# Patient Record
Sex: Male | Born: 1970
Health system: Southern US, Community
[De-identification: ages and names within clinical notes are randomized; demographics above are authoritative.]

## PROBLEM LIST (undated history)

## (undated) DIAGNOSIS — I4729 Other ventricular tachycardia: Secondary | ICD-10-CM

## (undated) DIAGNOSIS — Z9889 Other specified postprocedural states: Secondary | ICD-10-CM

## (undated) DIAGNOSIS — N138 Other obstructive and reflux uropathy: Secondary | ICD-10-CM

## (undated) DIAGNOSIS — E1140 Type 2 diabetes mellitus with diabetic neuropathy, unspecified: Secondary | ICD-10-CM

## (undated) DIAGNOSIS — I2119 ST elevation (STEMI) myocardial infarction involving other coronary artery of inferior wall: Secondary | ICD-10-CM

## (undated) DIAGNOSIS — I480 Paroxysmal atrial fibrillation: Secondary | ICD-10-CM

## (undated) DIAGNOSIS — I2510 Atherosclerotic heart disease of native coronary artery without angina pectoris: Secondary | ICD-10-CM

## (undated) DIAGNOSIS — I471 Supraventricular tachycardia, unspecified: Secondary | ICD-10-CM

## (undated) DIAGNOSIS — F0390 Unspecified dementia without behavioral disturbance: Secondary | ICD-10-CM

## (undated) DIAGNOSIS — H269 Unspecified cataract: Secondary | ICD-10-CM

## (undated) DIAGNOSIS — Z933 Colostomy status: Secondary | ICD-10-CM

## (undated) DIAGNOSIS — J95851 Ventilator associated pneumonia: Secondary | ICD-10-CM

## (undated) DIAGNOSIS — F32A Depression, unspecified: Secondary | ICD-10-CM

## (undated) DIAGNOSIS — I10 Essential (primary) hypertension: Secondary | ICD-10-CM

## (undated) DIAGNOSIS — Z9289 Personal history of other medical treatment: Secondary | ICD-10-CM

## (undated) DIAGNOSIS — I69320 Aphasia following cerebral infarction: Secondary | ICD-10-CM

## (undated) DIAGNOSIS — N319 Neuromuscular dysfunction of bladder, unspecified: Secondary | ICD-10-CM

## (undated) DIAGNOSIS — Z8619 Personal history of other infectious and parasitic diseases: Secondary | ICD-10-CM

## (undated) DIAGNOSIS — G2581 Restless legs syndrome: Secondary | ICD-10-CM

## (undated) DIAGNOSIS — J9690 Respiratory failure, unspecified, unspecified whether with hypoxia or hypercapnia: Secondary | ICD-10-CM

## (undated) DIAGNOSIS — M869 Osteomyelitis, unspecified: Secondary | ICD-10-CM

## (undated) DIAGNOSIS — D649 Anemia, unspecified: Secondary | ICD-10-CM

## (undated) DIAGNOSIS — E119 Type 2 diabetes mellitus without complications: Secondary | ICD-10-CM

## (undated) DIAGNOSIS — I639 Cerebral infarction, unspecified: Secondary | ICD-10-CM

## (undated) DIAGNOSIS — I472 Ventricular tachycardia: Secondary | ICD-10-CM

## (undated) DIAGNOSIS — I739 Peripheral vascular disease, unspecified: Secondary | ICD-10-CM

## (undated) DIAGNOSIS — N401 Enlarged prostate with lower urinary tract symptoms: Secondary | ICD-10-CM

## (undated) DIAGNOSIS — N189 Chronic kidney disease, unspecified: Secondary | ICD-10-CM

## (undated) DIAGNOSIS — G931 Anoxic brain damage, not elsewhere classified: Secondary | ICD-10-CM

## (undated) DIAGNOSIS — G8190 Hemiplegia, unspecified affecting unspecified side: Secondary | ICD-10-CM

## (undated) DIAGNOSIS — I469 Cardiac arrest, cause unspecified: Secondary | ICD-10-CM

## (undated) DIAGNOSIS — Z9359 Other cystostomy status: Secondary | ICD-10-CM

## (undated) DIAGNOSIS — R41841 Cognitive communication deficit: Secondary | ICD-10-CM

## (undated) DIAGNOSIS — K219 Gastro-esophageal reflux disease without esophagitis: Secondary | ICD-10-CM

## (undated) DIAGNOSIS — L219 Seborrheic dermatitis, unspecified: Secondary | ICD-10-CM

## (undated) HISTORY — DX: Respiratory failure, unspecified, unspecified whether with hypoxia or hypercapnia: J96.90

## (undated) HISTORY — DX: Ventilator associated pneumonia: J95.851

## (undated) HISTORY — DX: Anoxic brain damage, not elsewhere classified: G93.1

## (undated) HISTORY — DX: Other ventricular tachycardia: I47.29

## (undated) HISTORY — DX: Atherosclerotic heart disease of native coronary artery without angina pectoris: I25.10

## (undated) HISTORY — DX: ST elevation (STEMI) myocardial infarction involving other coronary artery of inferior wall: I21.19

## (undated) HISTORY — DX: Cardiac arrest, cause unspecified: I46.9

## (undated) HISTORY — DX: Type 2 diabetes mellitus without complications: E11.9

## (undated) HISTORY — DX: Ventricular tachycardia: I47.2

## (undated) HISTORY — DX: Essential (primary) hypertension: I10

---

## 1998-01-20 ENCOUNTER — Ambulatory Visit: Admission: RE | Admit: 1998-01-20 | Discharge: 1998-01-20 | Payer: Self-pay | Admitting: Unknown Physician Specialty

## 2010-04-19 ENCOUNTER — Encounter: Payer: Self-pay | Admitting: Internal Medicine

## 2010-04-25 ENCOUNTER — Encounter: Payer: Self-pay | Admitting: Internal Medicine

## 2010-05-01 ENCOUNTER — Encounter: Payer: Self-pay | Admitting: Internal Medicine

## 2010-05-17 ENCOUNTER — Encounter: Payer: Self-pay | Admitting: Internal Medicine

## 2010-05-24 ENCOUNTER — Institutional Professional Consult (permissible substitution) (INDEPENDENT_AMBULATORY_CARE_PROVIDER_SITE_OTHER): Payer: 59 | Admitting: Internal Medicine

## 2010-05-24 ENCOUNTER — Encounter: Payer: Self-pay | Admitting: Internal Medicine

## 2010-05-24 DIAGNOSIS — I472 Ventricular tachycardia: Secondary | ICD-10-CM

## 2010-05-25 DIAGNOSIS — I472 Ventricular tachycardia, unspecified: Secondary | ICD-10-CM | POA: Insufficient documentation

## 2010-05-25 DIAGNOSIS — I10 Essential (primary) hypertension: Secondary | ICD-10-CM | POA: Insufficient documentation

## 2010-05-31 NOTE — Assessment & Plan Note (Signed)
Summary: nep/abn ekg/dyspnea/ischemia/ [facility]/ref d...   Visit Type:  Initial Consult Referring Provider:  r. Nadyne Coombes Primary Provider:  [facility]  CC:  no symptoms.  History of Present Illness: Mr. Peak is referred today by Dr. Marko Plume for evaluation of NSVT during exercise.  The patient is a very pleasant 40 yo man with a h/o HTN and DM. He was found on routing screening to have an abnormal ECG with PVC's and underwent exercise testing where he had an exercise treadmill which demonstrated no ischemia and preserved LV function but exercise induced NSVT. He has never had syncope. He denies c/p, sob or peripheral edema. He does not have trouble working.  Review of his ECG demonstrates PVC's and NSVT originating from the RVOT.   Current Medications (verified): 1)  Metformin Hcl 1000 Mg Tabs (Metformin Hcl) .... Two Times A Day 2)  Glipizide 10 Mg Tabs (Glipizide) .... Once Daily 3)  Lisinopril 20 Mg Tabs (Lisinopril) .... Take One Tablet By Mouth Daily 4)  Lisinopril-Hydrochlorothiazide 20-12.5 Mg Tabs (Lisinopril-Hydrochlorothiazide) .... Every Morning 5)  Aspirin 81 Mg Tbec (Aspirin) .... Take One Tablet By Mouth Daily 6)  Allegra-D Allergy & Congestion 60-120 Mg Xr12h-Tab (Fexofenadine-Pseudoephedrine) .... Once Daily 7)  Fish Oil 1000 Mg Caps (Omega-3 Fatty Acids) .... Two Times A Day  Allergies (verified): No Known Drug Allergies  Past History:  Past Medical History: Last updated: 05/24/2010 Diabetes Type 2 Hypertension Tachycardia  Family History: Last updated: 05/24/2010 Mother: heart attack, hypertension and diabestes Family History of Coronary Artery Disease:  Family History of Diabetes:   Social History: Last updated: 05/24/2010 Single  Tobacco Use - Former.  Alcohol Use - no Regular Exercise - no Drug Use - no  Review of Systems       All systems reviewed and negative except as noted in the HPI.  Vital Signs:  Patient  profile:   40 year old male Height:      76 inches Weight:      273 pounds BMI:     33.35 Pulse rate:   95 / minute BP sitting:   142 / 74  (left arm) Cuff size:   large  Vitals Entered By: Hardin Negus, RMA (May 24, 2010 9:28 AM)  Physical Exam  General:  Well developed, well nourished, in no acute distress. Head:  normocephalic and atraumatic Eyes:  PERRLA/EOM intact; conjunctiva and lids normal. Mouth:  Teeth, gums and palate normal. Oral mucosa normal. Neck:  Neck supple, no JVD. No masses, thyromegaly or abnormal cervical nodes. Lungs:  Clear bilaterally to auscultation with no wheezes, rales, or rhonchi. Heart:  RRR with normal S1 and S2. PMI is not enlarged. Pulses:  pulses normal in all 4 extremities Extremities:  No clubbing or cyanosis. Neurologic:  Alert and oriented x 3.   EKG  Procedure date:  05/24/2010  Findings:      Normal sinus rhythm with rate of:  64. PVC's noted.    Impression & Recommendations:  Problem # 1:  VENTRICULAR TACHYCARDIA, PAROXYSMAL (ICD-427.1) His PVC's and exercise induced NSVT appear to be originating from the RVOT. I have recommended that the patient undergo a period of watchful waiting. He is asymptomatic at the present time. There is no evidence that he has any potentially more malignant cause of his ectopy. His updated medication list for this problem includes:    Lisinopril 20 Mg Tabs (Lisinopril) .Marland Kitchen... Take one tablet by mouth daily    Lisinopril-hydrochlorothiazide  20-12.5 Mg Tabs (Lisinopril-hydrochlorothiazide) ..... Every morning    Aspirin 81 Mg Tbec (Aspirin) .Marland Kitchen... Take one tablet by mouth daily  Problem # 2:  ESSENTIAL HYPERTENSION, BENIGN (ICD-401.1) His blood pressure is fairly well controlled. Will follow if needed. His updated medication list for this problem includes:    Lisinopril 20 Mg Tabs (Lisinopril) .Marland Kitchen... Take one tablet by mouth daily    Lisinopril-hydrochlorothiazide 20-12.5 Mg Tabs  (Lisinopril-hydrochlorothiazide) ..... Every morning    Aspirin 81 Mg Tbec (Aspirin) .Marland Kitchen... Take one tablet by mouth daily  Patient Instructions: 1)  Your physician recommends that you schedule a follow-up appointment as needed

## 2010-06-20 NOTE — Letter (Signed)
Summary: [facility]  [facility]   Imported By: Marylou Mccoy 06/13/2010 16:26:13  _____________________________________________________________________  External Attachment:    Type:   Image     Comment:   External Document

## 2010-08-11 ENCOUNTER — Telehealth: Payer: Self-pay | Admitting: Internal Medicine

## 2010-08-11 NOTE — Telephone Encounter (Signed)
LOV faxed to Cynthia/[facility] @ [phone] 08/11/10/km

## 2011-05-02 ENCOUNTER — Encounter: Payer: Self-pay | Admitting: Family Medicine

## 2011-07-18 ENCOUNTER — Ambulatory Visit: Payer: Self-pay | Admitting: Physician Assistant

## 2012-02-26 ENCOUNTER — Other Ambulatory Visit: Payer: Self-pay | Admitting: *Deleted

## 2012-02-26 MED ORDER — GLIPIZIDE 10 MG PO TABS: 10.0000 mg | ORAL_TABLET | Freq: Every day | ORAL | Status: DC

## 2012-02-26 MED ORDER — METFORMIN HCL 1000 MG PO TABS: 1000.0000 mg | ORAL_TABLET | Freq: Two times a day (BID) | ORAL | Status: DC

## 2012-03-05 ENCOUNTER — Ambulatory Visit (INDEPENDENT_AMBULATORY_CARE_PROVIDER_SITE_OTHER): Payer: 59 | Admitting: Physician Assistant

## 2012-03-05 ENCOUNTER — Encounter: Payer: Self-pay | Admitting: Physician Assistant

## 2012-03-05 VITALS — BP 164/86 | HR 88 | Temp 98.2°F | Resp 16 | Ht 74.0 in | Wt 287.4 lb

## 2012-03-05 DIAGNOSIS — I10 Essential (primary) hypertension: Secondary | ICD-10-CM

## 2012-03-05 DIAGNOSIS — E119 Type 2 diabetes mellitus without complications: Secondary | ICD-10-CM

## 2012-03-05 DIAGNOSIS — E785 Hyperlipidemia, unspecified: Secondary | ICD-10-CM

## 2012-03-05 LAB — CBC WITH DIFFERENTIAL/PLATELET
Basophils Absolute: 0 10*3/uL (ref 0.0–0.1)
HCT: 38.3 % — ABNORMAL LOW (ref 39.0–52.0)
Lymphocytes Relative: 38 % (ref 12–46)
Lymphs Abs: 2.2 10*3/uL (ref 0.7–4.0)
MCV: 86.1 fL (ref 78.0–100.0)
Neutro Abs: 2.9 10*3/uL (ref 1.7–7.7)
Platelets: 221 10*3/uL (ref 150–400)
RBC: 4.45 MIL/uL (ref 4.22–5.81)
RDW: 14.2 % (ref 11.5–15.5)
WBC: 5.8 10*3/uL (ref 4.0–10.5)

## 2012-03-05 LAB — GLUCOSE, POCT (MANUAL RESULT ENTRY): POC Glucose: 156 mg/dl — AB (ref 70–99)

## 2012-03-05 LAB — TSH: TSH: 2.282 u[IU]/mL (ref 0.350–4.500)

## 2012-03-05 LAB — COMPREHENSIVE METABOLIC PANEL
ALT: 25 U/L (ref 0–53)
AST: 19 U/L (ref 0–37)
Albumin: 4.2 g/dL (ref 3.5–5.2)
CO2: 25 mEq/L (ref 19–32)
Calcium: 9.3 mg/dL (ref 8.4–10.5)
Chloride: 101 mEq/L (ref 96–112)
Potassium: 4 mEq/L (ref 3.5–5.3)
Total Protein: 6.9 g/dL (ref 6.0–8.3)

## 2012-03-05 LAB — POCT GLYCOSYLATED HEMOGLOBIN (HGB A1C): Hemoglobin A1C: 6

## 2012-03-05 LAB — LIPID PANEL: LDL Cholesterol: 91 mg/dL (ref 0–99)

## 2012-03-05 MED ORDER — METFORMIN HCL 1000 MG PO TABS: 1000.0000 mg | ORAL_TABLET | Freq: Two times a day (BID) | ORAL | Status: DC

## 2012-03-05 MED ORDER — LISINOPRIL 10 MG PO TABS: 10.0000 mg | ORAL_TABLET | Freq: Every day | ORAL | Status: DC

## 2012-03-05 MED ORDER — LISINOPRIL-HYDROCHLOROTHIAZIDE 10-12.5 MG PO TABS: 1.0000 | ORAL_TABLET | Freq: Every day | ORAL | Status: DC

## 2012-03-05 MED ORDER — GLIPIZIDE 10 MG PO TABS: 10.0000 mg | ORAL_TABLET | Freq: Every day | ORAL | Status: DC

## 2012-03-05 NOTE — Progress Notes (Signed)
  Subjective:    Patient ID: Albert Jensen, male    DOB: 1970-08-31, 41 y.o.   MRN: [id number]  HPI 41 yr old CM presents for diabetes and hypertension check-up.  He is non-compliant checking sugars, but he does check them "occasionally" and would say his avg is around 160s.  He is compliant with his meds, but he has been off all of them for the past 2 weeks bc he ran out.  He denies any complaints or problems.  Review of Systems  All other systems reviewed and are negative.       Objective:   Physical Exam  Nursing note and vitals reviewed. Constitutional: He is oriented to person, place, and time. He appears well-developed and well-nourished.  HENT:  Head: Normocephalic and atraumatic.  Cardiovascular: Normal rate, regular rhythm and normal heart sounds.   Pulmonary/Chest: Effort normal and breath sounds normal.  Neurological: He is alert and oriented to person, place, and time.  Skin: Skin is warm and dry.  Psychiatric: He has a normal mood and affect. His behavior is normal. Judgment and thought content normal.          Assessment & Plan:  Diabetes-likely uncontrolled.  Will await labs. Encouraged healthier diet, exercise.  Hypertension-BP is up today, but he is out of meds.  It is usu controlled.  He will restart his meds today. Hyperlipidemia-likely needs a statin.

## 2012-03-06 LAB — MICROALBUMIN, URINE: Microalb, Ur: 1.4 mg/dL (ref 0.00–1.89)

## 2012-03-06 MED ORDER — FENOFIBRATE 48 MG PO TABS: 96.0000 mg | ORAL_TABLET | Freq: Every day | ORAL | Status: DC

## 2012-03-06 NOTE — Addendum Note (Signed)
Addended by: Anders Simmonds on: 03/06/2012 01:55 PM   Modules accepted: Orders

## 2012-03-10 ENCOUNTER — Telehealth: Payer: Self-pay

## 2012-03-10 NOTE — Telephone Encounter (Signed)
Pt requesting lab results.

## 2012-03-10 NOTE — Telephone Encounter (Signed)
Spoke with pt and went over results pt did understand and will follow up in 3 months.

## 2012-05-12 ENCOUNTER — Telehealth: Payer: Self-pay

## 2012-05-12 MED ORDER — FENOFIBRATE MICRONIZED 130 MG PO CAPS: 130.0000 mg | ORAL_CAPSULE | Freq: Every day | ORAL | Status: DC

## 2012-05-12 NOTE — Telephone Encounter (Signed)
Tried to do prior auth for pt's Rx for Fenofibrate 48 mg, 2 tab QD. I was told this is a non-covered med in this strength. It is a Tier 1 covered med w/out PA in strengths 43 mg, 67 mg, 130 mg, 134 mg ALL IN THE CONVENTIONAL CAPSULE FORM. Can we change pts Rx to one of these?

## 2012-05-12 NOTE — Telephone Encounter (Signed)
Rx sent to pharmacy   

## 2012-05-13 NOTE — Telephone Encounter (Signed)
Called patient to advise his meds were changed.

## 2015-01-22 ENCOUNTER — Emergency Department (HOSPITAL_COMMUNITY): Payer: Managed Care, Other (non HMO)

## 2015-01-22 ENCOUNTER — Ambulatory Visit (HOSPITAL_COMMUNITY): Admit: 2015-01-22 | Payer: Self-pay | Admitting: Interventional Cardiology

## 2015-01-22 ENCOUNTER — Encounter (HOSPITAL_COMMUNITY)
Admission: EM | Disposition: A | Discharge status: Nursing Home (Includes ICF) | Payer: Self-pay | Source: Home / Self Care | Attending: Pulmonary Disease

## 2015-01-22 ENCOUNTER — Inpatient Hospital Stay (HOSPITAL_COMMUNITY)
Admission: EM | Admit: 2015-01-22 | Discharge: 2015-02-08 | DRG: 003 | Disposition: A | Discharge status: Other Acute Inpatient Hospital | Payer: Managed Care, Other (non HMO) | Attending: Pulmonary Disease | Admitting: Pulmonary Disease

## 2015-01-22 DIAGNOSIS — Y9389 Activity, other specified: Secondary | ICD-10-CM

## 2015-01-22 DIAGNOSIS — E1165 Type 2 diabetes mellitus with hyperglycemia: Secondary | ICD-10-CM | POA: Diagnosis not present

## 2015-01-22 DIAGNOSIS — E669 Obesity, unspecified: Secondary | ICD-10-CM | POA: Diagnosis not present

## 2015-01-22 DIAGNOSIS — Z7984 Long term (current) use of oral hypoglycemic drugs: Secondary | ICD-10-CM | POA: Diagnosis not present

## 2015-01-22 DIAGNOSIS — L89150 Pressure ulcer of sacral region, unstageable: Secondary | ICD-10-CM | POA: Diagnosis not present

## 2015-01-22 DIAGNOSIS — E872 Acidosis: Secondary | ICD-10-CM | POA: Diagnosis not present

## 2015-01-22 DIAGNOSIS — Z930 Tracheostomy status: Secondary | ICD-10-CM | POA: Insufficient documentation

## 2015-01-22 DIAGNOSIS — L409 Psoriasis, unspecified: Secondary | ICD-10-CM | POA: Diagnosis present

## 2015-01-22 DIAGNOSIS — E785 Hyperlipidemia, unspecified: Secondary | ICD-10-CM | POA: Diagnosis present

## 2015-01-22 DIAGNOSIS — R9431 Abnormal electrocardiogram [ECG] [EKG]: Secondary | ICD-10-CM

## 2015-01-22 DIAGNOSIS — I10 Essential (primary) hypertension: Secondary | ICD-10-CM | POA: Diagnosis present

## 2015-01-22 DIAGNOSIS — I469 Cardiac arrest, cause unspecified: Secondary | ICD-10-CM | POA: Diagnosis present

## 2015-01-22 DIAGNOSIS — G9340 Encephalopathy, unspecified: Secondary | ICD-10-CM | POA: Diagnosis not present

## 2015-01-22 DIAGNOSIS — Z833 Family history of diabetes mellitus: Secondary | ICD-10-CM | POA: Diagnosis not present

## 2015-01-22 DIAGNOSIS — E870 Hyperosmolality and hypernatremia: Secondary | ICD-10-CM | POA: Diagnosis not present

## 2015-01-22 DIAGNOSIS — W1839XA Other fall on same level, initial encounter: Secondary | ICD-10-CM | POA: Diagnosis present

## 2015-01-22 DIAGNOSIS — R0603 Acute respiratory distress: Secondary | ICD-10-CM

## 2015-01-22 DIAGNOSIS — J9601 Acute respiratory failure with hypoxia: Secondary | ICD-10-CM | POA: Diagnosis not present

## 2015-01-22 DIAGNOSIS — I2129 ST elevation (STEMI) myocardial infarction involving other sites: Secondary | ICD-10-CM | POA: Diagnosis not present

## 2015-01-22 DIAGNOSIS — I462 Cardiac arrest due to underlying cardiac condition: Secondary | ICD-10-CM | POA: Diagnosis present

## 2015-01-22 DIAGNOSIS — G931 Anoxic brain damage, not elsewhere classified: Secondary | ICD-10-CM | POA: Diagnosis not present

## 2015-01-22 DIAGNOSIS — I213 ST elevation (STEMI) myocardial infarction of unspecified site: Secondary | ICD-10-CM | POA: Diagnosis present

## 2015-01-22 DIAGNOSIS — T17590A Other foreign object in bronchus causing asphyxiation, initial encounter: Secondary | ICD-10-CM | POA: Diagnosis not present

## 2015-01-22 DIAGNOSIS — J9600 Acute respiratory failure, unspecified whether with hypoxia or hypercapnia: Secondary | ICD-10-CM

## 2015-01-22 DIAGNOSIS — Z720 Tobacco use: Secondary | ICD-10-CM

## 2015-01-22 DIAGNOSIS — Z6829 Body mass index (BMI) 29.0-29.9, adult: Secondary | ICD-10-CM

## 2015-01-22 DIAGNOSIS — Z8249 Family history of ischemic heart disease and other diseases of the circulatory system: Secondary | ICD-10-CM

## 2015-01-22 DIAGNOSIS — Y9289 Other specified places as the place of occurrence of the external cause: Secondary | ICD-10-CM

## 2015-01-22 DIAGNOSIS — N170 Acute kidney failure with tubular necrosis: Secondary | ICD-10-CM | POA: Diagnosis not present

## 2015-01-22 DIAGNOSIS — J690 Pneumonitis due to inhalation of food and vomit: Secondary | ICD-10-CM | POA: Diagnosis not present

## 2015-01-22 DIAGNOSIS — I2102 ST elevation (STEMI) myocardial infarction involving left anterior descending coronary artery: Secondary | ICD-10-CM | POA: Diagnosis not present

## 2015-01-22 DIAGNOSIS — I959 Hypotension, unspecified: Secondary | ICD-10-CM | POA: Diagnosis not present

## 2015-01-22 DIAGNOSIS — R34 Anuria and oliguria: Secondary | ICD-10-CM | POA: Diagnosis not present

## 2015-01-22 DIAGNOSIS — Y95 Nosocomial condition: Secondary | ICD-10-CM | POA: Diagnosis not present

## 2015-01-22 DIAGNOSIS — N179 Acute kidney failure, unspecified: Secondary | ICD-10-CM | POA: Diagnosis not present

## 2015-01-22 DIAGNOSIS — J9811 Atelectasis: Secondary | ICD-10-CM | POA: Insufficient documentation

## 2015-01-22 DIAGNOSIS — B379 Candidiasis, unspecified: Secondary | ICD-10-CM | POA: Diagnosis not present

## 2015-01-22 DIAGNOSIS — J189 Pneumonia, unspecified organism: Secondary | ICD-10-CM | POA: Diagnosis not present

## 2015-01-22 DIAGNOSIS — Z978 Presence of other specified devices: Secondary | ICD-10-CM

## 2015-01-22 DIAGNOSIS — S225XXA Flail chest, initial encounter for closed fracture: Secondary | ICD-10-CM | POA: Diagnosis present

## 2015-01-22 DIAGNOSIS — R410 Disorientation, unspecified: Secondary | ICD-10-CM | POA: Diagnosis present

## 2015-01-22 DIAGNOSIS — Z4659 Encounter for fitting and adjustment of other gastrointestinal appliance and device: Secondary | ICD-10-CM

## 2015-01-22 DIAGNOSIS — R1310 Dysphagia, unspecified: Secondary | ICD-10-CM | POA: Diagnosis not present

## 2015-01-22 DIAGNOSIS — Z9289 Personal history of other medical treatment: Secondary | ICD-10-CM

## 2015-01-22 DIAGNOSIS — J9690 Respiratory failure, unspecified, unspecified whether with hypoxia or hypercapnia: Secondary | ICD-10-CM

## 2015-01-22 DIAGNOSIS — IMO0002 Reserved for concepts with insufficient information to code with codable children: Secondary | ICD-10-CM | POA: Diagnosis present

## 2015-01-22 DIAGNOSIS — I2510 Atherosclerotic heart disease of native coronary artery without angina pectoris: Secondary | ICD-10-CM | POA: Diagnosis present

## 2015-01-22 DIAGNOSIS — Z452 Encounter for adjustment and management of vascular access device: Secondary | ICD-10-CM

## 2015-01-22 DIAGNOSIS — I2119 ST elevation (STEMI) myocardial infarction involving other coronary artery of inferior wall: Principal | ICD-10-CM | POA: Diagnosis present

## 2015-01-22 DIAGNOSIS — J9819 Other pulmonary collapse: Secondary | ICD-10-CM

## 2015-01-22 DIAGNOSIS — E876 Hypokalemia: Secondary | ICD-10-CM | POA: Diagnosis not present

## 2015-01-22 DIAGNOSIS — I2111 ST elevation (STEMI) myocardial infarction involving right coronary artery: Secondary | ICD-10-CM | POA: Diagnosis not present

## 2015-01-22 DIAGNOSIS — D62 Acute posthemorrhagic anemia: Secondary | ICD-10-CM | POA: Diagnosis not present

## 2015-01-22 DIAGNOSIS — I639 Cerebral infarction, unspecified: Secondary | ICD-10-CM

## 2015-01-22 DIAGNOSIS — Z4689 Encounter for fitting and adjustment of other specified devices: Secondary | ICD-10-CM

## 2015-01-22 DIAGNOSIS — N390 Urinary tract infection, site not specified: Secondary | ICD-10-CM | POA: Diagnosis present

## 2015-01-22 DIAGNOSIS — L8990 Pressure ulcer of unspecified site, unspecified stage: Secondary | ICD-10-CM | POA: Diagnosis present

## 2015-01-22 HISTORY — PX: CARDIAC CATHETERIZATION: SHX172

## 2015-01-22 LAB — GLUCOSE, CAPILLARY
GLUCOSE-CAPILLARY: 385 mg/dL — AB (ref 65–99)
GLUCOSE-CAPILLARY: 318 mg/dL — AB (ref 65–99)
GLUCOSE-CAPILLARY: 234 mg/dL — AB (ref 65–99)
Glucose-Capillary: 474 mg/dL — ABNORMAL HIGH (ref 65–99)
Glucose-Capillary: 416 mg/dL — ABNORMAL HIGH (ref 65–99)
Glucose-Capillary: 268 mg/dL — ABNORMAL HIGH (ref 65–99)
Glucose-Capillary: 232 mg/dL — ABNORMAL HIGH (ref 65–99)

## 2015-01-22 LAB — BLOOD GAS, ARTERIAL
Acid-base deficit: 8.9 mmol/L — ABNORMAL HIGH (ref 0.0–2.0)
BICARBONATE: 16.7 meq/L — AB (ref 20.0–24.0)
Drawn by: 28338
FIO2: 0.5
O2 Saturation: 99.6 %
PATIENT TEMPERATURE: 98.6
PEEP: 5 cmH2O
RATE: 18 resp/min
TCO2: 17.9 mmol/L (ref 0–100)
VT: 570 mL
pCO2 arterial: 37.9 mmHg (ref 35.0–45.0)
pH, Arterial: 7.268 — ABNORMAL LOW (ref 7.350–7.450)
pO2, Arterial: 254 mmHg — ABNORMAL HIGH (ref 80.0–100.0)

## 2015-01-22 LAB — PROTIME-INR
INR: 1.09 (ref 0.00–1.49)
INR: 3.06 — ABNORMAL HIGH (ref 0.00–1.49)
INR: 1.08 (ref 0.00–1.49)
Prothrombin Time: 14.3 seconds (ref 11.6–15.2)
Prothrombin Time: 31.1 seconds — ABNORMAL HIGH (ref 11.6–15.2)
Prothrombin Time: 14.2 seconds (ref 11.6–15.2)

## 2015-01-22 LAB — CBC WITH DIFFERENTIAL/PLATELET
BASOS PCT: 0 %
Basophils Absolute: 0 10*3/uL (ref 0.0–0.1)
EOS ABS: 0.1 10*3/uL (ref 0.0–0.7)
EOS PCT: 1 %
HCT: 44 % (ref 39.0–52.0)
HEMOGLOBIN: 15.1 g/dL (ref 13.0–17.0)
Lymphocytes Relative: 37 %
Lymphs Abs: 4.7 10*3/uL — ABNORMAL HIGH (ref 0.7–4.0)
MCH: 29.7 pg (ref 26.0–34.0)
MCHC: 34.3 g/dL (ref 30.0–36.0)
MCV: 86.4 fL (ref 78.0–100.0)
MONO ABS: 0.5 10*3/uL (ref 0.1–1.0)
MONOS PCT: 4 %
NEUTROS PCT: 58 %
Neutro Abs: 7.3 10*3/uL (ref 1.7–7.7)
PLATELETS: 264 10*3/uL (ref 150–400)
RBC: 5.09 MIL/uL (ref 4.22–5.81)
RDW: 13.2 % (ref 11.5–15.5)
WBC: 12.6 10*3/uL — ABNORMAL HIGH (ref 4.0–10.5)

## 2015-01-22 LAB — I-STAT TROPONIN, ED: TROPONIN I, POC: 0.1 ng/mL — AB (ref 0.00–0.08)

## 2015-01-22 LAB — BASIC METABOLIC PANEL
Anion gap: 13 (ref 5–15)
BUN: 16 mg/dL (ref 6–20)
CALCIUM: 8.1 mg/dL — AB (ref 8.9–10.3)
CHLORIDE: 103 mmol/L (ref 101–111)
CO2: 19 mmol/L — AB (ref 22–32)
CREATININE: 1.11 mg/dL (ref 0.61–1.24)
GFR calc Af Amer: >60 mL/min (ref >60)
GFR calc non Af Amer: >60 mL/min (ref >60)
Glucose, Bld: 511 mg/dL — ABNORMAL HIGH (ref 65–99)
Potassium: 4.2 mmol/L (ref 3.5–5.1)
Sodium: 135 mmol/L (ref 135–145)

## 2015-01-22 LAB — APTT: aPTT: 87 seconds — ABNORMAL HIGH (ref 24–37)

## 2015-01-22 LAB — TROPONIN I
TROPONIN I: 2.66 ng/mL — AB (ref <0.031)
Troponin I: 7.54 ng/mL (ref <0.031)

## 2015-01-22 LAB — MRSA PCR SCREENING: MRSA BY PCR: NEGATIVE

## 2015-01-22 SURGERY — LEFT HEART CATH AND CORONARY ANGIOGRAPHY
Anesthesia: LOCAL

## 2015-01-22 MED ORDER — CANGRELOR TETRASODIUM 50 MG IV SOLR
INTRAVENOUS | Status: AC
  Filled 2015-01-22: qty 50

## 2015-01-22 MED ORDER — ACETAMINOPHEN 325 MG PO TABS
650.0000 mg | ORAL_TABLET | ORAL | Status: DC | PRN
  Administered 2015-01-23 – 2015-01-31 (×6): 650 mg via ORAL
  Filled 2015-01-22 (×6): qty 2

## 2015-01-22 MED ORDER — ROCURONIUM BROMIDE 50 MG/5ML IV SOLN
INTRAVENOUS | Status: AC
Start: 2015-01-22 — End: 2015-01-23
  Filled 2015-01-22: qty 2

## 2015-01-22 MED ORDER — ASPIRIN 81 MG PO CHEW
81.0000 mg | CHEWABLE_TABLET | Freq: Every day | ORAL | Status: DC
  Administered 2015-01-22 – 2015-02-08 (×18): 81 mg
  Filled 2015-01-22 (×19): qty 1

## 2015-01-22 MED ORDER — ONDANSETRON HCL 4 MG/2ML IJ SOLN: 4.0000 mg | Freq: Four times a day (QID) | INTRAMUSCULAR | Status: DC | PRN

## 2015-01-22 MED ORDER — SODIUM CHLORIDE 0.9 % IV SOLN
250.0000 mg | INTRAVENOUS | Status: DC | PRN
  Administered 2015-01-22: 1.75 mg/kg/h via INTRAVENOUS
  Administered 2015-01-22: 1.75 mg/kg/h

## 2015-01-22 MED ORDER — LIDOCAINE HCL (CARDIAC) 20 MG/ML IV SOLN
INTRAVENOUS | Status: AC
  Filled 2015-01-22: qty 5

## 2015-01-22 MED ORDER — ETOMIDATE 2 MG/ML IV SOLN
INTRAVENOUS | Status: AC
  Filled 2015-01-22: qty 20

## 2015-01-22 MED ORDER — MIDAZOLAM HCL 2 MG/2ML IJ SOLN
INTRAMUSCULAR | Status: DC | PRN
  Administered 2015-01-22 (×5): 2 mg via INTRAVENOUS

## 2015-01-22 MED ORDER — INSULIN ASPART 100 UNIT/ML ~~LOC~~ SOLN: 2.0000 [IU] | SUBCUTANEOUS | Status: DC

## 2015-01-22 MED ORDER — SUCCINYLCHOLINE CHLORIDE 20 MG/ML IJ SOLN
INTRAMUSCULAR | Status: DC | PRN
  Administered 2015-01-22: 200 mg via INTRAVENOUS

## 2015-01-22 MED ORDER — HEPARIN SODIUM (PORCINE) 1000 UNIT/ML IJ SOLN
INTRAMUSCULAR | Status: AC
  Filled 2015-01-22: qty 1

## 2015-01-22 MED ORDER — SODIUM CHLORIDE 0.9 % IV SOLN
25.0000 ug/h | INTRAVENOUS | Status: DC
  Administered 2015-01-22: 75 ug/h via INTRAVENOUS
  Administered 2015-01-22: 325 ug/h via INTRAVENOUS
  Administered 2015-01-23 (×2): 375 ug/h via INTRAVENOUS
  Administered 2015-01-23 – 2015-01-24 (×3): 400 ug/h via INTRAVENOUS
  Administered 2015-01-24 – 2015-01-25 (×2): 200 ug/h via INTRAVENOUS
  Administered 2015-01-25 – 2015-01-26 (×4): 300 ug/h via INTRAVENOUS
  Filled 2015-01-22 (×12): qty 50

## 2015-01-22 MED ORDER — SODIUM CHLORIDE 0.9 % IV SOLN
50000.0000 ug | INTRAVENOUS | Status: DC | PRN
  Administered 2015-01-22: 50000 ug
  Administered 2015-01-22: 4 ug/kg/min via INTRAVENOUS

## 2015-01-22 MED ORDER — HEPARIN (PORCINE) IN NACL 2-0.9 UNIT/ML-% IJ SOLN
INTRAMUSCULAR | Status: AC
  Filled 2015-01-22: qty 1500

## 2015-01-22 MED ORDER — VERAPAMIL HCL 2.5 MG/ML IV SOLN
INTRAVENOUS | Status: AC
  Filled 2015-01-22: qty 2

## 2015-01-22 MED ORDER — IOHEXOL 350 MG/ML SOLN
INTRAVENOUS | Status: DC | PRN
Start: 2015-01-22 — End: 2015-01-22
  Administered 2015-01-22: 145 mL via INTRA_ARTERIAL

## 2015-01-22 MED ORDER — CANGRELOR BOLUS VIA INFUSION
INTRAVENOUS | Status: DC | PRN
  Administered 2015-01-22: 3900 ug via INTRAVENOUS

## 2015-01-22 MED ORDER — BIVALIRUDIN BOLUS VIA INFUSION - CUPID
INTRAVENOUS | Status: DC | PRN
  Administered 2015-01-22: 97.5 mg via INTRAVENOUS

## 2015-01-22 MED ORDER — CHLORHEXIDINE GLUCONATE 0.12% ORAL RINSE (MEDLINE KIT)
15.0000 mL | Freq: Two times a day (BID) | OROMUCOSAL | Status: DC
  Administered 2015-01-22 – 2015-02-01 (×20): 15 mL via OROMUCOSAL

## 2015-01-22 MED ORDER — NITROGLYCERIN 1 MG/10 ML FOR IR/CATH LAB
INTRA_ARTERIAL | Status: AC
  Filled 2015-01-22: qty 10

## 2015-01-22 MED ORDER — TICAGRELOR 90 MG PO TABS
ORAL_TABLET | ORAL | Status: DC | PRN
  Administered 2015-01-22: 180 mg via NASOGASTRIC

## 2015-01-22 MED ORDER — SODIUM CHLORIDE 0.9 % IV SOLN
INTRAVENOUS | Status: DC
  Administered 2015-01-22: 0.9 [IU]/h via INTRAVENOUS
  Filled 2015-01-22: qty 2.5

## 2015-01-22 MED ORDER — TICAGRELOR 90 MG PO TABS
90.0000 mg | ORAL_TABLET | Freq: Two times a day (BID) | ORAL | Status: DC
  Administered 2015-01-23 – 2015-02-08 (×32): 90 mg
  Filled 2015-01-22 (×32): qty 1

## 2015-01-22 MED ORDER — SODIUM CHLORIDE 0.9 % IV SOLN
1.0000 mg/h | INTRAVENOUS | Status: DC
  Administered 2015-01-22: 1 mg/h via INTRAVENOUS
  Administered 2015-01-22: 4 mg/h via INTRAVENOUS
  Administered 2015-01-23 (×3): 7 mg/h via INTRAVENOUS
  Administered 2015-01-24: 4 mg/h via INTRAVENOUS
  Administered 2015-01-24 (×2): 8 mg/h via INTRAVENOUS
  Administered 2015-01-25: 2 mg/h via INTRAVENOUS
  Administered 2015-01-26: 3 mg/h via INTRAVENOUS
  Filled 2015-01-22 (×10): qty 10

## 2015-01-22 MED ORDER — SODIUM CHLORIDE 0.9 % IV SOLN
250.0000 mL | INTRAVENOUS | Status: DC | PRN
  Administered 2015-01-22 – 2015-02-06 (×2): 250 mL via INTRAVENOUS

## 2015-01-22 MED ORDER — SODIUM CHLORIDE 0.9 % WEIGHT BASED INFUSION
1.0000 mL/kg/h | INTRAVENOUS | Status: AC
  Administered 2015-01-22: 1 mL/kg/h via INTRAVENOUS

## 2015-01-22 MED ORDER — BIVALIRUDIN 250 MG IV SOLR
INTRAVENOUS | Status: AC
Start: 2015-01-22 — End: 2015-01-22
  Filled 2015-01-22: qty 250

## 2015-01-22 MED ORDER — BIVALIRUDIN 250 MG IV SOLR
INTRAVENOUS | Status: AC
  Filled 2015-01-22: qty 250

## 2015-01-22 MED ORDER — SODIUM CHLORIDE 0.9 % IJ SOLN
3.0000 mL | INTRAMUSCULAR | Status: DC | PRN
Start: 2015-01-22 — End: 2015-02-08

## 2015-01-22 MED ORDER — PANTOPRAZOLE SODIUM 40 MG IV SOLR
40.0000 mg | Freq: Every day | INTRAVENOUS | Status: DC
  Administered 2015-01-22: 40 mg via INTRAVENOUS
  Filled 2015-01-22: qty 40

## 2015-01-22 MED ORDER — INSULIN ASPART 100 UNIT/ML ~~LOC~~ SOLN
10.0000 [IU] | Freq: Once | SUBCUTANEOUS | Status: AC
  Administered 2015-01-22: 10 [IU] via SUBCUTANEOUS

## 2015-01-22 MED ORDER — SODIUM CHLORIDE 0.9 % IJ SOLN
3.0000 mL | Freq: Two times a day (BID) | INTRAMUSCULAR | Status: DC
  Administered 2015-01-23 – 2015-01-26 (×7): 3 mL via INTRAVENOUS
  Administered 2015-01-27: 23:00:00 via INTRAVENOUS
  Administered 2015-01-28 – 2015-02-08 (×21): 3 mL via INTRAVENOUS

## 2015-01-22 MED ORDER — ETOMIDATE 2 MG/ML IV SOLN
INTRAVENOUS | Status: DC | PRN
  Administered 2015-01-22: 20 mg via INTRAVENOUS

## 2015-01-22 MED ORDER — ANTISEPTIC ORAL RINSE SOLUTION (CORINZ)
7.0000 mL | OROMUCOSAL | Status: DC
  Administered 2015-01-22 – 2015-02-02 (×106): 7 mL via OROMUCOSAL

## 2015-01-22 MED ORDER — LIDOCAINE HCL (PF) 1 % IJ SOLN
INTRAMUSCULAR | Status: AC
  Filled 2015-01-22: qty 30

## 2015-01-22 MED ORDER — SUCCINYLCHOLINE CHLORIDE 20 MG/ML IJ SOLN
INTRAMUSCULAR | Status: AC
  Filled 2015-01-22: qty 1

## 2015-01-22 MED ORDER — FENTANYL CITRATE (PF) 100 MCG/2ML IJ SOLN
INTRAMUSCULAR | Status: DC | PRN
  Administered 2015-01-22: 50 ug via INTRAVENOUS
  Administered 2015-01-22: 25 ug via INTRAVENOUS
  Administered 2015-01-22 (×3): 50 ug via INTRAVENOUS

## 2015-01-22 MED ORDER — LIDOCAINE HCL (PF) 1 % IJ SOLN
INTRAMUSCULAR | Status: DC | PRN
  Administered 2015-01-22: 13:00:00

## 2015-01-22 SURGICAL SUPPLY — 25 items
BALLN EUPHORA RX 3.0X15 (BALLOONS) ×2
BALLN ~~LOC~~ EUPHORA RX 4.5X20 (BALLOONS) ×2
BALLOON EUPHORA RX 3.0X15 (BALLOONS) ×1 IMPLANT
BALLOON ~~LOC~~ EUPHORA RX 4.5X20 (BALLOONS) ×1 IMPLANT
CATH EXTRAC PRONTO 5.5F 138CM (CATHETERS) ×2 IMPLANT
CATH INFINITI 5 FR JL3.5 (CATHETERS) ×2 IMPLANT
CATH INFINITI 5FR ANG PIGTAIL (CATHETERS) ×2 IMPLANT
CATH INFINITI 5FR JL4 (CATHETERS) ×2 IMPLANT
CATH INFINITI JR4 5F (CATHETERS) ×2 IMPLANT
CATH VISTA GUIDE 6FR JR4 (CATHETERS) ×2 IMPLANT
GLIDESHEATH SLEND SS 6F .021 (SHEATH) ×2 IMPLANT
HOVERMATT SINGLE USE (MISCELLANEOUS) ×2 IMPLANT
KIT ENCORE 26 ADVANTAGE (KITS) ×2 IMPLANT
KIT HEART LEFT (KITS) ×2 IMPLANT
PACK CARDIAC CATHETERIZATION (CUSTOM PROCEDURE TRAY) ×2 IMPLANT
SHEATH PINNACLE 6F 10CM (SHEATH) ×2 IMPLANT
STENT SYNERGY DES 4X32 (Permanent Stent) ×2 IMPLANT
SYR MEDRAD MARK V 150ML (SYRINGE) ×2 IMPLANT
TRANSDUCER W/STOPCOCK (MISCELLANEOUS) ×2 IMPLANT
TUBING CIL FLEX 10 FLL-RA (TUBING) ×2 IMPLANT
VALVE GUARDIAN II ~~LOC~~ HEMO (MISCELLANEOUS) ×2 IMPLANT
WIRE ASAHI PROWATER 180CM (WIRE) ×2 IMPLANT
WIRE COUGAR XT STRL 190CM (WIRE) ×2 IMPLANT
WIRE EMERALD 3MM-J .035X150CM (WIRE) ×2 IMPLANT
WIRE SAFE-T 1.5MM-J .035X260CM (WIRE) ×2 IMPLANT
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
## 2015-01-22 NOTE — H&P (Signed)
Albert Jensen is an 44 y.o. male.   Primary Cardiologist: new PMD:  Chief Complaint: cardiac arrest HPI: 44 year old man who is brought in by EMS after suffering a cardiac arrest. History is obtained from the patient's friend who was with him at the time of the arrest. The patient was at an outdoor event. He had been there about 10 minutes. He walked up to his friend holding a T-shirt to give him. As he was standing, the eyes rolled back and he fell backwards. He hit his head directly on concrete.  Bystanders that were present started performing CPR. At one point, the patient's breathing returned. The friend who I spoke to states that the patient was snoring. The breathing again stopped. Resuscitation was attempted. He estimates that it was about 15 minutes before EMS came. After EMS arrived, he was defibrillated twice and had restoration of sinus rhythm.  Initial ECG showed significant ST elevations laterally with inferior ST depressions that reciprocal.  He was brought to the hospital. His airway was switched out in the emergency room. Repeat ECG in the hospital showed resolution of the ST elevation in the lateral leads. Due to him hitting his head, we elected to perform CT scan of the head. There is no evidence of bleeding. He will now be brought to the Cath Lab for emergent cardiac catheterization.  Past Medical History  Diagnosis Date  . Hypertension   . Diabetes mellitus     No past surgical history on file.  Family History  Problem Relation Age of Onset  . Heart disease Mother 28    MI  . Diabetes Father    Social History:  reports that he quit smoking about 9 years ago. He does not have any smokeless tobacco history on file. He reports that he drinks about 0.6 oz of alcohol per week. His drug history is not on file.  Allergies: No Known Allergies   (Not in a hospital admission)  Results for orders placed or performed during the hospital encounter of 01/22/15 (from the past 48  hour(s))  I-Stat Troponin, ED (not at [facility])     Status: Abnormal   Collection Time: 01/22/15 11:13 AM  Result Value Ref Range   Troponin i, poc 0.10 (HH) 0.00 - 0.08 ng/mL   Comment NOTIFIED PHYSICIAN    Comment 3            Comment: Due to the release kinetics of cTnI, a negative result within the first hours of the onset of symptoms does not rule out myocardial infarction with certainty. If myocardial infarction is still suspected, repeat the test at appropriate intervals.   CBC with Differential     Status: Abnormal   Collection Time: 01/22/15 11:28 AM  Result Value Ref Range   WBC 12.6 (H) 4.0 - 10.5 K/uL   RBC 5.09 4.22 - 5.81 MIL/uL   Hemoglobin 15.1 13.0 - 17.0 g/dL   HCT 40.9 81.1 - 91.4 %   MCV 86.4 78.0 - 100.0 fL   MCH 29.7 26.0 - 34.0 pg   MCHC 34.3 30.0 - 36.0 g/dL   RDW 78.2 95.6 - 21.3 %   Platelets 264 150 - 400 K/uL   Neutrophils Relative % 58 %   Neutro Abs 7.3 1.7 - 7.7 K/uL   Lymphocytes Relative 37 %   Lymphs Abs 4.7 (H) 0.7 - 4.0 K/uL   Monocytes Relative 4 %   Monocytes Absolute 0.5 0.1 - 1.0 K/uL   Eosinophils Relative 1 %  Eosinophils Absolute 0.1 0.0 - 0.7 K/uL   Basophils Relative 0 %   Basophils Absolute 0.0 0.0 - 0.1 K/uL   Dg Chest Portable 1 View  01/22/2015  CLINICAL DATA:  Cardiac arrest, status post intubation EXAM: PORTABLE CHEST 1 VIEW COMPARISON:  None. FINDINGS: Endotracheal tube terminates 2 cm above the carina. Low lung volumes.  Vascular crowding. The heart is top-normal in size for inspiration. Enteric tube courses into the stomach. IMPRESSION: Endotracheal tube terminates 2 cm above the carina. Low lung volumes with vascular crowding. Electronically Signed   By: Charline BillsSriyesh  Krishnan M.D.   On: 01/22/2015 11:46    ROS: Unable to obtain  OBJECTIVE:   Vitals:   Filed Vitals:   01/22/15 1109 01/22/15 1110 01/22/15 1115 01/22/15 1119  BP: 157/101 157/101 155/99   Pulse:   104 103  Temp:      TempSrc:      Resp: 17  24 19   SpO2:    90% 97%   I&O's:  No intake or output data in the 24 hours ending 01/22/15 1150 TELEMETRY: Reviewed telemetry pt in normal sinus rhythm:     PHYSICAL EXAM General: Intubated, sedated, fighting the vent Head:   Normal cephalic and atramatic  Lungs:   Coarse breath sounds bilaterally to auscultation. Heart:   HRRR S1 S2  No JVD.   Abdomen: abdomen soft and non-tender Msk:  Moving both upper extremities Extremities:  2+ right radial pulse  Neuro: Intubated, sedated Psych:  Intubated, sedated Skin: Psoriatic plaques  LABS: Basic Metabolic Panel: No results for input(s): NA, K, CL, CO2, GLUCOSE, BUN, CREATININE, CALCIUM, MG, PHOS in the last 72 hours. Liver Function Tests: No results for input(s): AST, ALT, ALKPHOS, BILITOT, PROT, ALBUMIN in the last 72 hours. No results for input(s): LIPASE, AMYLASE in the last 72 hours. CBC:  Recent Labs  01/22/15 1128  WBC 12.6*  NEUTROABS 7.3  HGB 15.1  HCT 44.0  MCV 86.4  PLT 264   Cardiac Enzymes: No results for input(s): CKTOTAL, CKMB, CKMBINDEX, TROPONINI in the last 72 hours. BNP: Invalid input(s): POCBNP D-Dimer: No results for input(s): DDIMER in the last 72 hours. Hemoglobin A1C: No results for input(s): HGBA1C in the last 72 hours. Fasting Lipid Panel: No results for input(s): CHOL, HDL, LDLCALC, TRIG, CHOLHDL, LDLDIRECT in the last 72 hours. Thyroid Function Tests: No results for input(s): TSH, T4TOTAL, T3FREE, THYROIDAB in the last 72 hours.  Invalid input(s): FREET3 Anemia Panel: No results for input(s): VITAMINB12, FOLATE, FERRITIN, TIBC, IRON, RETICCTPCT in the last 72 hours. Coag Panel:   No results found for: INR, PROTIME     Assessment/Plan Status post cardiac arrest. Transient ST elevation noted in the lateral leads. I personally reviewed the chest x-ray. He appears to have bilateral pulmonary edema. I personally reviewed the head CT scan and spoke with the radiologist. No signs of bleeding at this  time.  Given his young age and the fact that he had a witnessed arrest with bystander CPR, we'll plan on emergent cardiac catheterization. I suspect he will have a high-grade lesion in the circumflex. The patient has had a history of diabetes. This resolved with significant weight loss according to the friend. He does not smoke cigarettes but he does vape.  No drug use.  Further plans based on the cardiac cath result.  He is currently on ventilatory support. I spoke with critical care. They will likely start cooling the patient after we are done with a cardiac catheterization. He  has already received cold saline.  Critical care time 50 minutes  Summar Mcglothlin S. 01/22/2015, 11:50 AM

## 2015-01-22 NOTE — ED Provider Notes (Signed)
[age] YOM present with STEMI. I assisted Emily Filbertoug Delo MD with resuscitation including intubation.    INTUBATION Performed by: Thermon LeylandHedges,Aryona Sill Todd  Required items: required blood products, implants, devices, and special equipment available Patient identity confirmed: provided demographic data and hospital-assigned identification number Time out: Immediately prior to procedure a "time out" was called to verify the correct patient, procedure, equipment, support staff and site/side marked as required.  Indications: Post cardiac arrest/ not controlling airway  Intubation method: Glidescope Laryngoscopy   Preoxygenation: 100% BVM  Sedatives: Etomidate 20mg  Paralytic: Succinylcholine 200mg   Tube Size: 7.5  cuffed  Post-procedure assessment: chest rise and ETCO2 monitor Breath sounds: equal and absent over the epigastrium Tube secured with: ETT holder Chest x-ray interpreted by radiologist and me.  Chest x-ray findings: endotracheal tube in appropriate position  Patient tolerated the procedure well with no immediate complications.    Eyvonne MechanicJeffrey Chaselynn Kepple, PA-C 01/24/15 1441  Geoffery Lyonsouglas Delo, MD 01/26/15 [phone]

## 2015-01-22 NOTE — ED Notes (Signed)
Per EMS called out to witnessed arrest.  Witness states patient fell from standing position.  Prior to EMS arrival patient was shocked twice by AED.  Upon EMS arrival patient PEA, EMS initiated CPR, achieved ROSC after 10 minutes.  EKG showed STEMI.  IO and IJ in place from EMS.  Given 1500 mL NS, 3x EPI, 1x D50 per EMS prior to arrival.

## 2015-01-22 NOTE — Progress Notes (Signed)
Pt having mild shivering, mostly in upper extremities and torso, current temperature at 36.4 C, with water temperature decreasing to 13. He continues to synchronize with ventilator and has a rass score of -2. Dr Sherene SiresWert advised to continue increasing sedation if BP allows, call back if shivering continues.  Discussed with Dr. Sherene SiresWert about postponing arterial sheath removal until tomorrow. Current INR is 3.05 and pt has had some mild oozing from site. Site remains stable but pt is at hight risk for bleed. Will attempt to pull sheath in the am per orders.

## 2015-01-22 NOTE — Code Documentation (Signed)
MD at bedside.  Preparing to move king airway placed by EMS. Physician to place ET tube.

## 2015-01-22 NOTE — Significant Event (Signed)
Rapid Response Event Note  Overview:  Called to assist by STEMI page. Assisted with patient disposition, monitoring and communication.    Initial Focused Assessment:   Interventions:   Event Summary:   at      at          [name], Albert Jensen Ann

## 2015-01-22 NOTE — Progress Notes (Signed)
   01/22/15 1200  Clinical Encounter Type  Visited With Family  Visit Type Spiritual support  Referral From Nurse  Spiritual Encounters  Spiritual Needs Other (Comment)  Stress Factors  Family Stress Factors Lack of knowledge;Loss of control;Health changes  Took family to cath lab waiting area.

## 2015-01-22 NOTE — Progress Notes (Signed)
Patient came in via EMS with a king airway ED physician exchanged with a 7.5 ETT taped at 27 cm at lip, good color change on ETCO2 detector good BBS, SATS 100%, placed on above vent settings, patient transported to CT, and then to CATH lab with no incident. SATS remained at 100%.

## 2015-01-23 ENCOUNTER — Other Ambulatory Visit (HOSPITAL_COMMUNITY): Payer: Self-pay

## 2015-01-23 ENCOUNTER — Inpatient Hospital Stay (HOSPITAL_COMMUNITY): Payer: Managed Care, Other (non HMO)

## 2015-01-23 DIAGNOSIS — J9601 Acute respiratory failure with hypoxia: Secondary | ICD-10-CM

## 2015-01-23 LAB — URINE MICROSCOPIC-ADD ON

## 2015-01-23 LAB — BLOOD GAS, ARTERIAL
Acid-base deficit: 2.5 mmol/L — ABNORMAL HIGH (ref 0.0–2.0)
Acid-base deficit: 1.6 mmol/L (ref 0.0–2.0)
BICARBONATE: 23.4 meq/L (ref 20.0–24.0)
Bicarbonate: 23.2 meq/L (ref 20.0–24.0)
Drawn by: 24513
Drawn by: 280981
FIO2: 0.4
FIO2: 0.4
LHR: 22 {breaths}/min
MECHVT: 570 mL
MECHVT: 570 mL
O2 Saturation: 96.7 %
O2 Saturation: 98.5 %
PATIENT TEMPERATURE: 96.8
PEEP: 5 cmH2O
PEEP: 5 cmH2O
PO2 ART: 113 mmHg — AB (ref 80.0–100.0)
Patient temperature: 98.6
RATE: 18 {breaths}/min
TCO2: 24.8 mmol/L (ref 0–100)
TCO2: 24.8 mmol/L (ref 0–100)
pCO2 arterial: 51.3 mmHg — ABNORMAL HIGH (ref 35.0–45.0)
pCO2 arterial: 42.6 mmHg (ref 35.0–45.0)
pH, Arterial: 7.279 — ABNORMAL LOW (ref 7.350–7.450)
pH, Arterial: 7.353 (ref 7.350–7.450)
pO2, Arterial: 95.1 mmHg (ref 80.0–100.0)

## 2015-01-23 LAB — GLUCOSE, CAPILLARY
GLUCOSE-CAPILLARY: 126 mg/dL — AB (ref 65–99)
GLUCOSE-CAPILLARY: 133 mg/dL — AB (ref 65–99)
GLUCOSE-CAPILLARY: 140 mg/dL — AB (ref 65–99)
GLUCOSE-CAPILLARY: 163 mg/dL — AB (ref 65–99)
GLUCOSE-CAPILLARY: 167 mg/dL — AB (ref 65–99)
GLUCOSE-CAPILLARY: 170 mg/dL — AB (ref 65–99)
GLUCOSE-CAPILLARY: 207 mg/dL — AB (ref 65–99)
GLUCOSE-CAPILLARY: 75 mg/dL (ref 65–99)
Glucose-Capillary: 100 mg/dL — ABNORMAL HIGH (ref 65–99)
Glucose-Capillary: 115 mg/dL — ABNORMAL HIGH (ref 65–99)
Glucose-Capillary: 124 mg/dL — ABNORMAL HIGH (ref 65–99)
Glucose-Capillary: 131 mg/dL — ABNORMAL HIGH (ref 65–99)
Glucose-Capillary: 145 mg/dL — ABNORMAL HIGH (ref 65–99)
Glucose-Capillary: 146 mg/dL — ABNORMAL HIGH (ref 65–99)
Glucose-Capillary: 157 mg/dL — ABNORMAL HIGH (ref 65–99)
Glucose-Capillary: 158 mg/dL — ABNORMAL HIGH (ref 65–99)
Glucose-Capillary: 197 mg/dL — ABNORMAL HIGH (ref 65–99)

## 2015-01-23 LAB — URINALYSIS, ROUTINE W REFLEX MICROSCOPIC
Glucose, UA: NEGATIVE mg/dL
Ketones, ur: NEGATIVE mg/dL
LEUKOCYTES UA: NEGATIVE
NITRITE: POSITIVE — AB
PROTEIN: 100 mg/dL — AB
SPECIFIC GRAVITY, URINE: 1.029 (ref 1.005–1.030)
UROBILINOGEN UA: 1 mg/dL (ref 0.0–1.0)
pH: 5 (ref 5.0–8.0)

## 2015-01-23 LAB — POCT I-STAT 3, ART BLOOD GAS (G3+)
ACID-BASE DEFICIT: 11 mmol/L — AB (ref 0.0–2.0)
Bicarbonate: 17.7 mEq/L — ABNORMAL LOW (ref 20.0–24.0)
O2 Saturation: 97 %
PH ART: 7.178 — AB (ref 7.350–7.450)
TCO2: 19 mmol/L (ref 0–100)
pCO2 arterial: 47.6 mmHg — ABNORMAL HIGH (ref 35.0–45.0)
pO2, Arterial: 111 mmHg — ABNORMAL HIGH (ref 80.0–100.0)

## 2015-01-23 LAB — BASIC METABOLIC PANEL
Anion gap: 8 (ref 5–15)
BUN: 14 mg/dL (ref 6–20)
CHLORIDE: 106 mmol/L (ref 101–111)
CO2: 24 mmol/L (ref 22–32)
Calcium: 8.3 mg/dL — ABNORMAL LOW (ref 8.9–10.3)
Creatinine, Ser: 0.87 mg/dL (ref 0.61–1.24)
GFR calc Af Amer: >60 mL/min (ref >60)
Glucose, Bld: 77 mg/dL (ref 65–99)
POTASSIUM: 3.4 mmol/L — AB (ref 3.5–5.1)
SODIUM: 138 mmol/L (ref 135–145)

## 2015-01-23 LAB — TROPONIN I
Troponin I: 10.3 ng/mL
Troponin I: 11.56 ng/mL

## 2015-01-23 LAB — CBC
HCT: 44.6 % (ref 39.0–52.0)
HEMOGLOBIN: 15.3 g/dL (ref 13.0–17.0)
MCH: 29.3 pg (ref 26.0–34.0)
MCHC: 34.3 g/dL (ref 30.0–36.0)
MCV: 85.4 fL (ref 78.0–100.0)
Platelets: 250 10*3/uL (ref 150–400)
RBC: 5.22 MIL/uL (ref 4.22–5.81)
RDW: 13.6 % (ref 11.5–15.5)
WBC: 11.9 10*3/uL — ABNORMAL HIGH (ref 4.0–10.5)

## 2015-01-23 LAB — POCT I-STAT, CHEM 8
BUN: 17 mg/dL (ref 6–20)
CALCIUM ION: 1.17 mmol/L (ref 1.12–1.23)
Chloride: 100 mmol/L — ABNORMAL LOW (ref 101–111)
Creatinine, Ser: 0.9 mg/dL (ref 0.61–1.24)
GLUCOSE: 586 mg/dL — AB (ref 65–99)
HCT: 47 % (ref 39.0–52.0)
HEMOGLOBIN: 16 g/dL (ref 13.0–17.0)
Potassium: 4 mmol/L (ref 3.5–5.1)
SODIUM: 136 mmol/L (ref 135–145)
TCO2: 18 mmol/L (ref 0–100)

## 2015-01-23 LAB — POCT ACTIVATED CLOTTING TIME: ACTIVATED CLOTTING TIME: 374 s

## 2015-01-23 LAB — MAGNESIUM: Magnesium: 2 mg/dL (ref 1.7–2.4)

## 2015-01-23 LAB — PHOSPHORUS: PHOSPHORUS: 2.7 mg/dL (ref 2.5–4.6)

## 2015-01-23 MED ORDER — NOREPINEPHRINE BITARTRATE 1 MG/ML IV SOLN
2.0000 ug/min | INTRAVENOUS | Status: DC
  Filled 2015-01-23: qty 4

## 2015-01-23 MED ORDER — ATORVASTATIN CALCIUM 80 MG PO TABS
80.0000 mg | ORAL_TABLET | Freq: Every day | ORAL | Status: DC
  Administered 2015-01-23 – 2015-02-02 (×10): 80 mg via ORAL
  Filled 2015-01-23 (×10): qty 1

## 2015-01-23 MED ORDER — POTASSIUM CHLORIDE 10 MEQ/100ML IV SOLN
10.0000 meq | INTRAVENOUS | Status: AC
  Administered 2015-01-23 (×3): 10 meq via INTRAVENOUS
  Filled 2015-01-23 (×3): qty 100

## 2015-01-23 MED ORDER — INSULIN GLARGINE 100 UNIT/ML ~~LOC~~ SOLN
25.0000 [IU] | SUBCUTANEOUS | Status: DC
  Administered 2015-01-23 – 2015-01-24 (×2): 25 [IU] via SUBCUTANEOUS
  Filled 2015-01-23 (×3): qty 0.25

## 2015-01-23 MED ORDER — PANTOPRAZOLE SODIUM 40 MG PO PACK
40.0000 mg | PACK | Freq: Every day | ORAL | Status: DC
  Administered 2015-01-23 – 2015-02-08 (×17): 40 mg
  Filled 2015-01-23 (×18): qty 20

## 2015-01-23 MED ORDER — DEXTROSE 5 % IV SOLN
1.0000 g | INTRAVENOUS | Status: AC
  Administered 2015-01-23 – 2015-01-27 (×5): 1 g via INTRAVENOUS
  Filled 2015-01-23 (×5): qty 10

## 2015-01-23 MED ORDER — VITAL HIGH PROTEIN PO LIQD
1000.0000 mL | ORAL | Status: DC
  Administered 2015-01-23 – 2015-01-24 (×4)
  Administered 2015-01-25: 1000 mL
  Administered 2015-01-25 (×3)
  Administered 2015-01-26: 1000 mL
  Administered 2015-01-27: 19:00:00
  Administered 2015-01-27: 1000 mL
  Administered 2015-01-28: 02:00:00
  Administered 2015-01-28: 1000 mL
  Administered 2015-01-28: 01:00:00
  Administered 2015-01-29 – 2015-01-31 (×3): 1000 mL
  Filled 2015-01-23 (×15): qty 1000

## 2015-01-23 MED ORDER — LACTATED RINGERS IV BOLUS (SEPSIS)
1000.0000 mL | Freq: Once | INTRAVENOUS | Status: AC
  Administered 2015-01-23: 1000 mL via INTRAVENOUS

## 2015-01-23 MED ORDER — INSULIN ASPART 100 UNIT/ML ~~LOC~~ SOLN
2.0000 [IU] | SUBCUTANEOUS | Status: DC
  Administered 2015-01-23: 4 [IU] via SUBCUTANEOUS
  Administered 2015-01-23: 2 [IU] via SUBCUTANEOUS
  Administered 2015-01-24: 4 [IU] via SUBCUTANEOUS
  Administered 2015-01-24: 2 [IU] via SUBCUTANEOUS
  Administered 2015-01-24: 4 [IU] via SUBCUTANEOUS
  Administered 2015-01-24 (×3): 6 [IU] via SUBCUTANEOUS

## 2015-01-23 MED ORDER — ATROPINE SULFATE 0.1 MG/ML IJ SOLN
INTRAMUSCULAR | Status: AC
  Filled 2015-01-23: qty 10

## 2015-01-23 MED ORDER — ENOXAPARIN SODIUM 40 MG/0.4ML ~~LOC~~ SOLN
40.0000 mg | SUBCUTANEOUS | Status: DC
  Administered 2015-01-23 – 2015-02-06 (×14): 40 mg via SUBCUTANEOUS
  Filled 2015-01-23 (×15): qty 0.4

## 2015-01-23 NOTE — Progress Notes (Signed)
Utilization Review Completed.Albert Jensen T10/16/2016  

## 2015-01-23 NOTE — Consult Note (Signed)
PULMONARY / CRITICAL CARE MEDICINE   Name: Albert MarylandRicky Jensen MRN: [id number] DOB: 03/10/1971    ADMISSION DATE:  01/22/2015 CONSULTATION DATE:  10/15  REFERRING MD :  Isabel CapriceVaranassi   CHIEF COMPLAINT:  Cardiac arrest   INITIAL PRESENTATION: 44yo male with hx HTN, DM presented 10/15 after witnessed arrest.  Initial rhythm PEA on EMS arrival with approx 25 mins CPR before ROSC (15 mins bystander and 10 mins with EMS). EKG concerning for STEMI, after negative head CT he was taken urgently to cath lab and PCCM consulted for vent management/hypothermia protocol.   STUDIES:  CT head/c-spine 10/15>>>neg acute  SIGNIFICANT EVENTS: 10/15>cath lab  SUBJECTIVE:   Decreased UOP, improved with NS bolus.  Mild hypotension.   VITAL SIGNS: Temp:  [95.2 F (35.1 C)-98.8 F (37.1 C)] 98.2 F (36.8 C) (10/16 0900) Pulse Rate:  [0-224] 105 (10/16 1000) Resp:  [0-33] 19 (10/16 1000) BP: (89-171)/(59-115) 110/68 mmHg (10/16 0828) SpO2:  [0 %-100 %] 100 % (10/16 1000) Arterial Line BP: (90-130)/(53-77) 99/64 mmHg (10/16 1000) FiO2 (%):  [40 %-100 %] 40 % (10/16 0828) Weight:  [245 lb 2.4 oz (111.2 kg)] 245 lb 2.4 oz (111.2 kg) (10/15 1410) HEMODYNAMICS:   VENTILATOR SETTINGS: Vent Mode:  [-] PRVC FiO2 (%):  [40 %-100 %] 40 % Set Rate:  [18 bmp] 18 bmp Vt Set:  [570 mL] 570 mL PEEP:  [5 cmH20] 5 cmH20 Plateau Pressure:  [20 cmH20-24 cmH20] 23 cmH20 INTAKE / OUTPUT:  Intake/Output Summary (Last 24 hours) at 01/23/15 1024 Last data filed at 01/23/15 0900  Gross per 24 hour  Intake 2848.86 ml  Output   3565 ml  Net -716.14 ml    PHYSICAL EXAMINATION: General:  wdwn male, post arrest, appears older than stated age  Neuro:  Agitated intermittently and ?purposeful pulling at ETT but not following commands, MAE,  HEENT:  PERRL, mm moist, ETT< C-collar  Cardiovascular:  s1s2 rrr Lungs:  resps even non labored on vent, few scattered rhonchi  Abdomen:  Round, soft, hypoactive bs  Musculoskeletal:   Warm and dry, multiple large areas plaque psoriasis    LABS:  CBC  Recent Labs Lab 01/22/15 1128 01/23/15 0318  WBC 12.6* 11.9*  HGB 15.1 15.3  HCT 44.0 44.6  PLT 264 250   Coag's  Recent Labs Lab 01/22/15 1128 01/22/15 1430 01/22/15 2200  APTT  --  87*  --   INR 1.09 3.06* 1.08   BMET  Recent Labs Lab 01/22/15 1430 01/23/15 0318  NA 135 138  K 4.2 3.4*  CL 103 106  CO2 19* 24  BUN 16 14  CREATININE 1.11 0.87  GLUCOSE 511* 77   Electrolytes  Recent Labs Lab 01/22/15 1430 01/23/15 0318  CALCIUM 8.1* 8.3*  MG  --  2.0  PHOS  --  2.7   Sepsis Markers No results for input(s): LATICACIDVEN, PROCALCITON, O2SATVEN in the last 168 hours. ABG  Recent Labs Lab 01/22/15 1520 01/23/15 0423  PHART 7.268* 7.279*  PCO2ART 37.9 51.3*  PO2ART 254* 95.1   Liver Enzymes No results for input(s): AST, ALT, ALKPHOS, BILITOT, ALBUMIN in the last 168 hours. Cardiac Enzymes  Recent Labs Lab 01/22/15 2200 01/23/15 0318 01/23/15 0815  TROPONINI 7.54* 10.30* 11.56*   Glucose  Recent Labs Lab 01/23/15 0157 01/23/15 0255 01/23/15 0403 01/23/15 0410 01/23/15 0552 01/23/15 0706  GLUCAP 170* 167* 75 124* 100* 115*    Imaging Ct Head Wo Contrast  01/22/2015  CLINICAL DATA:  Code STEMI, status  post fall, hit back of head, intubated EXAM: CT HEAD WITHOUT CONTRAST CT CERVICAL SPINE WITHOUT CONTRAST TECHNIQUE: Multidetector CT imaging of the head and cervical spine was performed following the standard protocol without intravenous contrast. Multiplanar CT image reconstructions of the cervical spine were also generated. COMPARISON:  None. FINDINGS: CT HEAD FINDINGS No evidence of parenchymal hemorrhage or extra-axial fluid collection. No mass lesion, mass effect, or midline shift. No CT evidence of acute infarction. Cerebral volume is within normal limits.  No ventriculomegaly. Near complete opacification of the right sphenoid sinus. The mastoid air cells are  unopacified. No evidence of calvarial fracture. CT CERVICAL SPINE FINDINGS Normal cervical lordosis. No evidence of fracture or dislocation. Vertebral body heights and intervertebral disc spaces are maintained. Dens appears intact. No prevertebral soft tissue swelling. IMPRESSION: Normal head CT. Normal cervical spine CT. These results were called by telephone at the time of interpretation on 01/22/2015 at 11:33 am to Dr. Eldridge Dace, who verbally acknowledged these results. Electronically Signed   By: Charline Bills M.D.   On: 01/22/2015 11:55   Ct Cervical Spine Wo Contrast  01/22/2015  CLINICAL DATA:  Code STEMI, status post fall, hit back of head, intubated EXAM: CT HEAD WITHOUT CONTRAST CT CERVICAL SPINE WITHOUT CONTRAST TECHNIQUE: Multidetector CT imaging of the head and cervical spine was performed following the standard protocol without intravenous contrast. Multiplanar CT image reconstructions of the cervical spine were also generated. COMPARISON:  None. FINDINGS: CT HEAD FINDINGS No evidence of parenchymal hemorrhage or extra-axial fluid collection. No mass lesion, mass effect, or midline shift. No CT evidence of acute infarction. Cerebral volume is within normal limits.  No ventriculomegaly. Near complete opacification of the right sphenoid sinus. The mastoid air cells are unopacified. No evidence of calvarial fracture. CT CERVICAL SPINE FINDINGS Normal cervical lordosis. No evidence of fracture or dislocation. Vertebral body heights and intervertebral disc spaces are maintained. Dens appears intact. No prevertebral soft tissue swelling. IMPRESSION: Normal head CT. Normal cervical spine CT. These results were called by telephone at the time of interpretation on 01/22/2015 at 11:33 am to Dr. Eldridge Dace, who verbally acknowledged these results. Electronically Signed   By: Charline Bills M.D.   On: 01/22/2015 11:55   Dg Chest Port 1 View  01/23/2015  CLINICAL DATA:  Respiratory failure.   Hypertension and diabetes. EXAM: PORTABLE CHEST 1 VIEW COMPARISON:  01/22/2015 FINDINGS: Endotracheal tube terminates 2.6 cm above carina. Nasogastric extends beyond the inferior aspect of the film. Numerous leads and wires project over the chest. Cardiomegaly accentuated by AP portable technique. No pleural effusion or pneumothorax. Low lung volumes with resultant pulmonary interstitial prominence. No congestive failure. No lobar consolidation. IMPRESSION: Similar appearance of cardiomegaly and low lung volumes. No acute findings. Electronically Signed   By: Jeronimo Greaves M.D.   On: 01/23/2015 08:01   Dg Chest Portable 1 View  01/22/2015  CLINICAL DATA:  Cardiac arrest, status post intubation EXAM: PORTABLE CHEST 1 VIEW COMPARISON:  None. FINDINGS: Endotracheal tube terminates 2 cm above the carina. Low lung volumes.  Vascular crowding. The heart is top-normal in size for inspiration. Enteric tube courses into the stomach. IMPRESSION: Endotracheal tube terminates 2 cm above the carina. Low lung volumes with vascular crowding. Electronically Signed   By: Charline Bills M.D.   On: 01/22/2015 11:46     ASSESSMENT / PLAN:  PULMONARY OETT 10/15>>> Acute respiratory failure - post cardiac arrest  Mild respiratory acidosis  P:   Vent support - 8cc/kg  F/u CXR  F/u ABG SBT when meets criteria  Increase RR 22  CARDIOVASCULAR CVL  Cardiac arrest - PEA  STEMI  RCA occlusion - s/p stenting 10/15 P:  Anticoagulation per cards  2D echo pending   ASA  Normothermia protocol  Hold home lisinopril, tricor, HCTZ   RENAL Hypokalemia  P:   F/u chem  Gentle volume   GASTROINTESTINAL No active issue  P:   PPI  Start TF   HEMATOLOGIC No active issue  P:  Anticoagulation per cards  F/u CBC SQ heparin   INFECTIOUS UTI  P:   Urine culture 10/16>>>  Rocephin 10/16>>>  ENDOCRINE Hx DM  P:   Insulin gtt - transition off to lantus, SSI  Hold home metformin   NEUROLOGIC AMS -  post cardiac arrest  Fall - head CT neg  Shivering  P:   RASS goal: -2 Fentanyl, versed gtt  Normothermia protocol - no paralytic gtt  EEG pending  Will need to clear c-spine once awake    FAMILY  - Updates:  No family available 10/16.  Brother to arrive this afternoon, will update him once here.     Dirk Dress, NP 01/23/2015  10:24 AM Pager: [phone] or [phone]

## 2015-01-23 NOTE — Procedures (Signed)
Central Venous Catheter Insertion Procedure Note Tye MarylandRicky Ramone [id number] 07/05/1970  Procedure: Insertion of Central Venous Catheter Indications: Assessment of intravascular volume and Drug and/or fluid administration  Procedure Details Consent: Risks of procedure as well as the alternatives and risks of each were explained to the (patient/caregiver).  Consent for procedure obtained. Time Out: Verified patient identification, verified procedure, site/side was marked, verified correct patient position, special equipment/implants available, medications/allergies/relevent history reviewed, required imaging and test results available.  Performed  Maximum sterile technique was used including antiseptics, cap, gloves, gown, hand hygiene, mask and sheet. Skin prep: Chlorhexidine; local anesthetic administered A antimicrobial bonded/coated triple lumen catheter was placed in the left internal jugular vein using the Seldinger technique.  Evaluation Blood flow good Complications: No apparent complications Patient did tolerate procedure well. Chest X-ray ordered to verify placement.  CXR: pending.   Performed under direct MD supervision.  Performed using ultrasound guidance.  Wire visualized in vessel under ultrasound.    Dirk DressKaty Whiteheart, NP 01/23/2015  1:39 PM

## 2015-01-23 NOTE — Progress Notes (Signed)
Subjective: Pt intubated, sedated   Objective: Filed Vitals:   01/23/15 0400 01/23/15 0500 01/23/15 0600 01/23/15 0700  BP: 96/59  107/60   Pulse:   94 100  Temp: 96.3 F (35.7 C) 96.4 F (35.8 C) 95.9 F (35.5 C) 95.2 F (35.1 C)  TempSrc: Core (Comment) Core (Comment) Core (Comment) Core (Comment)  Resp: 0 Height:      Weight:      SpO2:   100% 100%   Weight change:   Intake/Output Summary (Last 24 hours) at 01/23/15 0821 Last data filed at 01/23/15 0800  Gross per 24 hour  Intake 2846.16 ml  Output   3520 ml  Net -673.84 ml    General: INtubated,sedated   Neck:  C collar   Heart: Regular rate and rhythm, without murmurs, rubs, gallops.  Lungs  Rhonchi Exemities:  No edema.     Tele:  SR  100 Lab Results: Results for orders placed or performed during the hospital encounter of 01/22/15 (from the past 24 hour(s))  I-Stat Troponin, ED (not at [facility])     Status: Abnormal   Collection Time: 01/22/15 11:13 AM  Result Value Ref Range   Troponin i, poc 0.10 (HH) 0.00 - 0.08 ng/mL   Comment NOTIFIED PHYSICIAN    Comment 3          CBC with Differential     Status: Abnormal   Collection Time: 01/22/15 11:28 AM  Result Value Ref Range   WBC 12.6 (H) 4.0 - 10.5 K/uL   RBC 5.09 4.22 - 5.81 MIL/uL   Hemoglobin 15.1 13.0 - 17.0 g/dL   HCT 16.1 09.6 - 04.5 %   MCV 86.4 78.0 - 100.0 fL   MCH 29.7 26.0 - 34.0 pg   MCHC 34.3 30.0 - 36.0 g/dL   RDW 40.9 81.1 - 91.4 %   Platelets 264 150 - 400 K/uL   Neutrophils Relative % 58 %   Neutro Abs 7.3 1.7 - 7.7 K/uL   Lymphocytes Relative 37 %   Lymphs Abs 4.7 (H) 0.7 - 4.0 K/uL   Monocytes Relative 4 %   Monocytes Absolute 0.5 0.1 - 1.0 K/uL   Eosinophils Relative 1 %   Eosinophils Absolute 0.1 0.0 - 0.7 K/uL   Basophils Relative 0 %   Basophils Absolute 0.0 0.0 - 0.1 K/uL  Protime-INR     Status: None   Collection Time: 01/22/15 11:28 AM  Result Value Ref Range   Prothrombin Time 14.3 11.6 - 15.2 seconds   INR 1.09 0.00 - 1.49  MRSA PCR Screening     Status: None   Collection Time: 01/22/15  2:15 PM  Result Value Ref Range   MRSA by PCR NEGATIVE NEGATIVE  Troponin I     Status: Abnormal   Collection Time: 01/22/15  2:30 PM  Result Value Ref Range   Troponin I 2.66 (HH) <0.031 ng/mL  Basic metabolic panel     Status: Abnormal   Collection Time: 01/22/15  2:30 PM  Result Value Ref Range   Sodium 135 135 - 145 mmol/L   Potassium 4.2 3.5 - 5.1 mmol/L   Chloride 103 101 - 111 mmol/L   CO2 19 (L) 22 - 32 mmol/L   Glucose, Bld 511 (H) 65 - 99 mg/dL   BUN 16 6 - 20 mg/dL   Creatinine, Ser 7.82 0.61 - 1.24 mg/dL   Calcium 8.1 (L) 8.9 - 10.3 mg/dL   GFR calc non  Af Amer >60 >60 mL/min   GFR calc Af Amer >60 >60 mL/min   Anion gap 13 5 - 15  Protime-INR now     Status: Abnormal   Collection Time: 01/22/15  2:30 PM  Result Value Ref Range   Prothrombin Time 31.1 (H) 11.6 - 15.2 seconds   INR 3.06 (H) 0.00 - 1.49  APTT     Status: Abnormal   Collection Time: 01/22/15  2:30 PM  Result Value Ref Range   aPTT 87 (H) 24 - 37 seconds  Glucose, capillary     Status: Abnormal   Collection Time: 01/22/15  2:30 PM  Result Value Ref Range   Glucose-Capillary 474 (H) 65 - 99 mg/dL  Blood gas, arterial     Status: Abnormal   Collection Time: 01/22/15  3:20 PM  Result Value Ref Range   FIO2 0.50    Delivery systems VENTILATOR    Mode PRESSURE REGULATED VOLUME CONTROL    VT 570 mL   LHR 18 resp/min   Peep/cpap 5.0 cm H20   pH, Arterial 7.268 (L) 7.350 - 7.450   pCO2 arterial 37.9 35.0 - 45.0 mmHg   pO2, Arterial 254 (H) 80.0 - 100.0 mmHg   Bicarbonate 16.7 (L) 20.0 - 24.0 mEq/L   TCO2 17.9 0 - 100 mmol/L   Acid-base deficit 8.9 (H) 0.0 - 2.0 mmol/L   O2 Saturation 99.6 %   Patient temperature 98.6    Collection site A-LINE    Drawn by [phone]    Sample type ARTERIAL    Allens test (pass/fail) PASS PASS  Glucose, capillary     Status: Abnormal   Collection Time: 01/22/15  4:55 PM  Result  Value Ref Range   Glucose-Capillary 416 (H) 65 - 99 mg/dL   Comment 1 Capillary Specimen   Glucose, capillary     Status: Abnormal   Collection Time: 01/22/15  6:33 PM  Result Value Ref Range   Glucose-Capillary 385 (H) 65 - 99 mg/dL   Comment 1 Capillary Specimen   Glucose, capillary     Status: Abnormal   Collection Time: 01/22/15  7:53 PM  Result Value Ref Range   Glucose-Capillary 318 (H) 65 - 99 mg/dL   Comment 1 Capillary Specimen   Glucose, capillary     Status: Abnormal   Collection Time: 01/22/15  9:17 PM  Result Value Ref Range   Glucose-Capillary 268 (H) 65 - 99 mg/dL  Troponin I     Status: Abnormal   Collection Time: 01/22/15 10:00 PM  Result Value Ref Range   Troponin I 7.54 (HH) <0.031 ng/mL  Protime-INR     Status: None   Collection Time: 01/22/15 10:00 PM  Result Value Ref Range   Prothrombin Time 14.2 11.6 - 15.2 seconds   INR 1.08 0.00 - 1.49  Glucose, capillary     Status: Abnormal   Collection Time: 01/22/15 10:07 PM  Result Value Ref Range   Glucose-Capillary 232 (H) 65 - 99 mg/dL   Comment 1 Arterial Specimen   Glucose, capillary     Status: Abnormal   Collection Time: 01/22/15 11:12 PM  Result Value Ref Range   Glucose-Capillary 234 (H) 65 - 99 mg/dL   Comment 1 Capillary Specimen   Glucose, capillary     Status: Abnormal   Collection Time: 01/23/15 12:17 AM  Result Value Ref Range   Glucose-Capillary 207 (H) 65 - 99 mg/dL   Comment 1 Capillary Specimen   Glucose, capillary  Status: Abnormal   Collection Time: 01/23/15  1:26 AM  Result Value Ref Range   Glucose-Capillary 197 (H) 65 - 99 mg/dL   Comment 1 Capillary Specimen   Glucose, capillary     Status: Abnormal   Collection Time: 01/23/15  1:57 AM  Result Value Ref Range   Glucose-Capillary 170 (H) 65 - 99 mg/dL   Comment 1 Capillary Specimen    Comment 2 Notify RN    Comment 3 Glucose Stabilizer   Glucose, capillary     Status: Abnormal   Collection Time: 01/23/15  2:55 AM  Result  Value Ref Range   Glucose-Capillary 167 (H) 65 - 99 mg/dL   Comment 1 Capillary Specimen    Comment 2 Notify RN    Comment 3 Glucose Stabilizer   Troponin I     Status: Abnormal   Collection Time: 01/23/15  3:18 AM  Result Value Ref Range   Troponin I 10.30 (HH) <0.031 ng/mL  Basic metabolic panel     Status: Abnormal   Collection Time: 01/23/15  3:18 AM  Result Value Ref Range   Sodium 138 135 - 145 mmol/L   Potassium 3.4 (L) 3.5 - 5.1 mmol/L   Chloride 106 101 - 111 mmol/L   CO2 24 22 - 32 mmol/L   Glucose, Bld 77 65 - 99 mg/dL   BUN 14 6 - 20 mg/dL   Creatinine, Ser 8.11 0.61 - 1.24 mg/dL   Calcium 8.3 (L) 8.9 - 10.3 mg/dL   GFR calc non Af Amer >60 >60 mL/min   GFR calc Af Amer >60 >60 mL/min   Anion gap 8 5 - 15  CBC     Status: Abnormal   Collection Time: 01/23/15  3:18 AM  Result Value Ref Range   WBC 11.9 (H) 4.0 - 10.5 K/uL   RBC 5.22 4.22 - 5.81 MIL/uL   Hemoglobin 15.3 13.0 - 17.0 g/dL   HCT 91.4 78.2 - 95.6 %   MCV 85.4 78.0 - 100.0 fL   MCH 29.3 26.0 - 34.0 pg   MCHC 34.3 30.0 - 36.0 g/dL   RDW 21.3 08.6 - 57.8 %   Platelets 250 150 - 400 K/uL  Magnesium     Status: None   Collection Time: 01/23/15  3:18 AM  Result Value Ref Range   Magnesium 2.0 1.7 - 2.4 mg/dL  Phosphorus     Status: None   Collection Time: 01/23/15  3:18 AM  Result Value Ref Range   Phosphorus 2.7 2.5 - 4.6 mg/dL  Glucose, capillary     Status: None   Collection Time: 01/23/15  4:03 AM  Result Value Ref Range   Glucose-Capillary 75 65 - 99 mg/dL   Comment 1 Repeat Test   Glucose, capillary     Status: Abnormal   Collection Time: 01/23/15  4:10 AM  Result Value Ref Range   Glucose-Capillary 124 (H) 65 - 99 mg/dL   Comment 1 Capillary Specimen    Comment 2 Notify RN    Comment 3 Glucose Stabilizer   Blood gas, arterial     Status: Abnormal   Collection Time: 01/23/15  4:23 AM  Result Value Ref Range   FIO2 0.40    Delivery systems VENTILATOR    Mode PRESSURE REGULATED VOLUME  CONTROL    VT 570 mL   LHR 18 resp/min   Peep/cpap 5.0 cm H20   pH, Arterial 7.279 (L) 7.350 - 7.450   pCO2 arterial 51.3 (H) 35.0 -  45.0 mmHg   pO2, Arterial 95.1 80.0 - 100.0 mmHg   Bicarbonate 23.2 20.0 - 24.0 mEq/L   TCO2 24.8 0 - 100 mmol/L   Acid-base deficit 2.5 (H) 0.0 - 2.0 mmol/L   O2 Saturation 96.7 %   Patient temperature 98.6    Collection site A-LINE    Drawn by [phone]    Sample type ARTERIAL DRAW   Glucose, capillary     Status: Abnormal   Collection Time: 01/23/15  5:52 AM  Result Value Ref Range   Glucose-Capillary 100 (H) 65 - 99 mg/dL   Comment 1 Capillary Specimen    Comment 2 Arterial Specimen    Comment 3 Glucose Stabilizer   Urinalysis, Routine w reflex microscopic (not at [facility])     Status: Abnormal   Collection Time: 01/23/15  6:07 AM  Result Value Ref Range   Color, Urine AMBER (A) YELLOW   APPearance TURBID (A) CLEAR   Specific Gravity, Urine 1.029 1.005 - 1.030   pH 5.0 5.0 - 8.0   Glucose, UA NEGATIVE NEGATIVE mg/dL   Hgb urine dipstick MODERATE (A) NEGATIVE   Bilirubin Urine SMALL (A) NEGATIVE   Ketones, ur NEGATIVE NEGATIVE mg/dL   Protein, ur 981 (A) NEGATIVE mg/dL   Urobilinogen, UA 1.0 0.0 - 1.0 mg/dL   Nitrite POSITIVE (A) NEGATIVE   Leukocytes, UA NEGATIVE NEGATIVE  Urine microscopic-add on     Status: Abnormal   Collection Time: 01/23/15  6:07 AM  Result Value Ref Range   Squamous Epithelial / LPF RARE RARE   WBC, UA 3-6 <3 WBC/hpf   RBC / HPF 0-2 <3 RBC/hpf   Bacteria, UA FEW (A) RARE   Casts GRANULAR CAST (A) NEGATIVE   Urine-Other AMORPHOUS URATES/PHOSPHATES   Glucose, capillary     Status: Abnormal   Collection Time: 01/23/15  7:06 AM  Result Value Ref Range   Glucose-Capillary 115 (H) 65 - 99 mg/dL   Comment 1 Capillary Specimen    Comment 2 Notify RN    Comment 3 Glucose Stabilizer     Studies/Results: Ct Head Wo Contrast  01/22/2015  CLINICAL DATA:  Code STEMI, status post fall, hit back of head, intubated EXAM: CT  HEAD WITHOUT CONTRAST CT CERVICAL SPINE WITHOUT CONTRAST TECHNIQUE: Multidetector CT imaging of the head and cervical spine was performed following the standard protocol without intravenous contrast. Multiplanar CT image reconstructions of the cervical spine were also generated. COMPARISON:  None. FINDINGS: CT HEAD FINDINGS No evidence of parenchymal hemorrhage or extra-axial fluid collection. No mass lesion, mass effect, or midline shift. No CT evidence of acute infarction. Cerebral volume is within normal limits.  No ventriculomegaly. Near complete opacification of the right sphenoid sinus. The mastoid air cells are unopacified. No evidence of calvarial fracture. CT CERVICAL SPINE FINDINGS Normal cervical lordosis. No evidence of fracture or dislocation. Vertebral body heights and intervertebral disc spaces are maintained. Dens appears intact. No prevertebral soft tissue swelling. IMPRESSION: Normal head CT. Normal cervical spine CT. These results were called by telephone at the time of interpretation on 01/22/2015 at 11:33 am to Dr. Eldridge Dace, who verbally acknowledged these results. Electronically Signed   By: Charline Bills M.D.   On: 01/22/2015 11:55   Ct Cervical Spine Wo Contrast  01/22/2015  CLINICAL DATA:  Code STEMI, status post fall, hit back of head, intubated EXAM: CT HEAD WITHOUT CONTRAST CT CERVICAL SPINE WITHOUT CONTRAST TECHNIQUE: Multidetector CT imaging of the head and cervical spine was performed following the standard  protocol without intravenous contrast. Multiplanar CT image reconstructions of the cervical spine were also generated. COMPARISON:  None. FINDINGS: CT HEAD FINDINGS No evidence of parenchymal hemorrhage or extra-axial fluid collection. No mass lesion, mass effect, or midline shift. No CT evidence of acute infarction. Cerebral volume is within normal limits.  No ventriculomegaly. Near complete opacification of the right sphenoid sinus. The mastoid air cells are unopacified.  No evidence of calvarial fracture. CT CERVICAL SPINE FINDINGS Normal cervical lordosis. No evidence of fracture or dislocation. Vertebral body heights and intervertebral disc spaces are maintained. Dens appears intact. No prevertebral soft tissue swelling. IMPRESSION: Normal head CT. Normal cervical spine CT. These results were called by telephone at the time of interpretation on 01/22/2015 at 11:33 am to Dr. Eldridge Dace, who verbally acknowledged these results. Electronically Signed   By: Charline Bills M.D.   On: 01/22/2015 11:55   Dg Chest Port 1 View  01/23/2015  CLINICAL DATA:  Respiratory failure.  Hypertension and diabetes. EXAM: PORTABLE CHEST 1 VIEW COMPARISON:  01/22/2015 FINDINGS: Endotracheal tube terminates 2.6 cm above carina. Nasogastric extends beyond the inferior aspect of the film. Numerous leads and wires project over the chest. Cardiomegaly accentuated by AP portable technique. No pleural effusion or pneumothorax. Low lung volumes with resultant pulmonary interstitial prominence. No congestive failure. No lobar consolidation. IMPRESSION: Similar appearance of cardiomegaly and low lung volumes. No acute findings. Electronically Signed   By: Jeronimo Greaves M.D.   On: 01/23/2015 08:01   Dg Chest Portable 1 View  01/22/2015  CLINICAL DATA:  Cardiac arrest, status post intubation EXAM: PORTABLE CHEST 1 VIEW COMPARISON:  None. FINDINGS: Endotracheal tube terminates 2 cm above the carina. Low lung volumes.  Vascular crowding. The heart is top-normal in size for inspiration. Enteric tube courses into the stomach. IMPRESSION: Endotracheal tube terminates 2 cm above the carina. Low lung volumes with vascular crowding. Electronically Signed   By: Charline Bills M.D.   On: 01/22/2015 11:46    Medications: Reviewed    @PROBHOSP @  1  Cardiac arrest.   Pt is now in cooling protocol.   Cath yesterday : L coronary not engaged.  RCA showed  Mid RCA 99% lesion  Underwent asp thrombectomy,  PCI/Synergy stent  and Brilinta.  Plan for DAPT for at least one year   Pt with head trauma  Now with C Collar  WIll need to be cleared  CT head negative   Delay echo until he can comply with tech to get good study.    2  DM  WIll need to be followed  Check A1C  3  HL check  Start atorvastatin.  Check lipid panel    LOS: 1 day   Dietrich Pates 01/23/2015, 8:21 AM

## 2015-01-23 NOTE — Progress Notes (Signed)
eLink Physician-Brief Progress Note Patient Name: Tye MarylandRicky Kassin DOB: 08/06/1970 MRN: [id number]   Date of Service  01/23/2015  HPI/Events of Note  Decreasing UOP.  eICU Interventions  Checking UA. LR 1 L bolus over 1 hour.     Intervention Category Intermediate Interventions: Oliguria - evaluation and management  Lawanda CousinsJennings Kennadi [place] 01/23/2015, 5:31 AM

## 2015-01-23 NOTE — Progress Notes (Signed)
Sheath removed from right groin, pressure held for 15 minutes, no signs of bleeding or distress, dressing applied to right groin with gauze & tape, level 0 in right groin. Patients nurse Scarlette CalicoFrances at bedside as well while removing.

## 2015-01-24 ENCOUNTER — Inpatient Hospital Stay (HOSPITAL_COMMUNITY): Payer: Managed Care, Other (non HMO)

## 2015-01-24 ENCOUNTER — Encounter (HOSPITAL_COMMUNITY): Payer: Self-pay | Admitting: Interventional Cardiology

## 2015-01-24 DIAGNOSIS — J9690 Respiratory failure, unspecified, unspecified whether with hypoxia or hypercapnia: Secondary | ICD-10-CM | POA: Diagnosis present

## 2015-01-24 DIAGNOSIS — I2119 ST elevation (STEMI) myocardial infarction involving other coronary artery of inferior wall: Principal | ICD-10-CM

## 2015-01-24 LAB — CBC
HCT: 40 % (ref 39.0–52.0)
HEMOGLOBIN: 13.4 g/dL (ref 13.0–17.0)
MCH: 29.3 pg (ref 26.0–34.0)
MCHC: 33.5 g/dL (ref 30.0–36.0)
MCV: 87.5 fL (ref 78.0–100.0)
Platelets: 178 10*3/uL (ref 150–400)
RBC: 4.57 MIL/uL (ref 4.22–5.81)
RDW: 13.9 % (ref 11.5–15.5)
WBC: 9.9 10*3/uL (ref 4.0–10.5)

## 2015-01-24 LAB — GLUCOSE, CAPILLARY
GLUCOSE-CAPILLARY: 135 mg/dL — AB (ref 65–99)
GLUCOSE-CAPILLARY: 218 mg/dL — AB (ref 65–99)
Glucose-Capillary: 154 mg/dL — ABNORMAL HIGH (ref 65–99)
Glucose-Capillary: 225 mg/dL — ABNORMAL HIGH (ref 65–99)
Glucose-Capillary: 225 mg/dL — ABNORMAL HIGH (ref 65–99)
Glucose-Capillary: 262 mg/dL — ABNORMAL HIGH (ref 65–99)

## 2015-01-24 LAB — URINE CULTURE: Culture: NO GROWTH

## 2015-01-24 LAB — LIPID PANEL
CHOL/HDL RATIO: 6.5 ratio
CHOLESTEROL: 188 mg/dL (ref 0–200)
HDL: 29 mg/dL — AB (ref >40)
LDL Cholesterol: UNDETERMINED mg/dL (ref 0–99)
TRIGLYCERIDES: 474 mg/dL — AB (ref <150)
VLDL: UNDETERMINED mg/dL (ref 0–40)

## 2015-01-24 LAB — HEMOGLOBIN A1C
Hgb A1c MFr Bld: 12.3 % — ABNORMAL HIGH (ref 4.8–5.6)
MEAN PLASMA GLUCOSE: 306 mg/dL

## 2015-01-24 LAB — BASIC METABOLIC PANEL
Anion gap: 11 (ref 5–15)
BUN: 17 mg/dL (ref 6–20)
CHLORIDE: 107 mmol/L (ref 101–111)
CO2: 23 mmol/L (ref 22–32)
CREATININE: 1.02 mg/dL (ref 0.61–1.24)
Calcium: 8.5 mg/dL — ABNORMAL LOW (ref 8.9–10.3)
GFR calc Af Amer: >60 mL/min (ref >60)
GFR calc non Af Amer: >60 mL/min (ref >60)
Glucose, Bld: 147 mg/dL — ABNORMAL HIGH (ref 65–99)
Potassium: 4.1 mmol/L (ref 3.5–5.1)
Sodium: 141 mmol/L (ref 135–145)

## 2015-01-24 MED ORDER — INSULIN NPH (HUMAN) (ISOPHANE) 100 UNIT/ML ~~LOC~~ SUSP
5.0000 [IU] | Freq: Once | SUBCUTANEOUS | Status: AC
  Administered 2015-01-25: 5 [IU] via SUBCUTANEOUS
  Filled 2015-01-24: qty 10

## 2015-01-24 MED ORDER — PRO-STAT SUGAR FREE PO LIQD
60.0000 mL | Freq: Three times a day (TID) | ORAL | Status: DC
  Administered 2015-01-24 – 2015-02-03 (×27): 60 mL
  Filled 2015-01-24 (×27): qty 60

## 2015-01-24 MED ORDER — INSULIN ASPART 100 UNIT/ML ~~LOC~~ SOLN
0.0000 [IU] | SUBCUTANEOUS | Status: DC
  Administered 2015-01-25 (×4): 5 [IU] via SUBCUTANEOUS
  Administered 2015-01-25: 8 [IU] via SUBCUTANEOUS
  Administered 2015-01-25: 5 [IU] via SUBCUTANEOUS
  Administered 2015-01-25 – 2015-01-26 (×6): 8 [IU] via SUBCUTANEOUS
  Administered 2015-01-26: 5 [IU] via SUBCUTANEOUS
  Administered 2015-01-27: 8 [IU] via SUBCUTANEOUS
  Administered 2015-01-27: 5 [IU] via SUBCUTANEOUS
  Administered 2015-01-27: 8 [IU] via SUBCUTANEOUS
  Administered 2015-01-27 – 2015-01-28 (×4): 5 [IU] via SUBCUTANEOUS
  Administered 2015-01-28: 11 [IU] via SUBCUTANEOUS
  Administered 2015-01-28 (×3): 5 [IU] via SUBCUTANEOUS
  Administered 2015-01-29 (×3): 8 [IU] via SUBCUTANEOUS

## 2015-01-24 MED ORDER — CARVEDILOL 3.125 MG PO TABS
3.1250 mg | ORAL_TABLET | Freq: Two times a day (BID) | ORAL | Status: DC
  Administered 2015-01-24 – 2015-01-25 (×3): 3.125 mg via ORAL
  Filled 2015-01-24 (×3): qty 1

## 2015-01-24 MED ORDER — INSULIN GLARGINE 100 UNIT/ML ~~LOC~~ SOLN
30.0000 [IU] | SUBCUTANEOUS | Status: DC
  Administered 2015-01-25 – 2015-01-29 (×5): 30 [IU] via SUBCUTANEOUS
  Filled 2015-01-24 (×5): qty 0.3

## 2015-01-24 MED ORDER — SODIUM CHLORIDE 0.9 % IV SOLN
INTRAVENOUS | Status: DC
  Administered 2015-01-24: 22:00:00 via INTRAVENOUS
  Administered 2015-01-24: 100 mL/h via INTRAVENOUS
  Administered 2015-01-25 – 2015-01-26 (×3): via INTRAVENOUS

## 2015-01-24 MED ORDER — HYDRALAZINE HCL 20 MG/ML IJ SOLN
10.0000 mg | INTRAMUSCULAR | Status: DC | PRN
  Administered 2015-01-24 – 2015-01-26 (×4): 10 mg via INTRAVENOUS
  Filled 2015-01-24 (×4): qty 1

## 2015-01-24 NOTE — Progress Notes (Signed)
eLink Physician-Brief Progress Note Patient Name: Tye MarylandRicky Craine DOB: 04/19/1970 MRN: [id number]   Date of Service  01/24/2015  HPI/Events of Note  BP = 193/119. Already on Coreg.   eICU Interventions  Will add Hydralazine 10 mg IV Q 4 hours PRN SBP > 170.     Intervention Category Intermediate Interventions: Hypertension - evaluation and management  Tabia Landowski [place] 01/24/2015, 11:02 PM

## 2015-01-24 NOTE — Progress Notes (Signed)
EEG Completed; Results Pending  

## 2015-01-24 NOTE — Procedures (Signed)
EEG report.  Brief clinical history:  44 year old man who is brought in by EMS after suffering a cardiac arrest. History is obtained from the patient's friend who was with him at the time of the arrest. The patient was at an outdoor event. He had been there about 10 minutes. He walked up to his friend holding a T-shirt to give him. As he was standing, the eyes rolled back and he fell backwards. He hit his head directly on concrete. Currently on HT protocol.  Technique: this is a 17 channel routine scalp EEG performed at the bedside in the ICU, with bipolar and monopolar montages arranged in accordance to the international 10/20 system of electrode placement. One channel was dedicated to EKG recording.  The patient is intubated on the vent, on HT protocol. No activating procedures performed.  Description: as the study opens and throughout the entire recording, there is an admixture of polymorphic theta-delta frequencies that are non reactive and don't follow an ictal pattern. At times higher frequencies are seen anteriorly. No electrographic seizures, focal or generalized epileptiform discharges noted.  EKG showed sinus rhythm.  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 A follow up study during or after rewarming would be advisable. The presence of beta activity is most likely a medication effect. Please, be aware that a normal EEG does not exclude the possibility of epilepsy.  Clinical correlation is advised.   Wyatt Portelasvaldo Steffani Dionisio, MD [facility]

## 2015-01-24 NOTE — Progress Notes (Signed)
eLink Physician-Brief Progress Note Patient Name: Albert MarylandRicky Jensen DOB: 03/14/1971 MRN: [id number]   Date of Service  01/24/2015  HPI/Events of Note  RN notified ongoing hyperglycemia off insulin gtt despite lantus. Tube feeds ongoing. Needing regular SSI coverage.  eICU Interventions  NPH 5u now. Increase lantus to 30 units. Increasing SSI to moderate.     Intervention Category Intermediate Interventions: Hyperglycemia - evaluation and treatment  [name] 01/24/2015, 11:53 PM

## 2015-01-24 NOTE — Progress Notes (Signed)
Initial Nutrition Assessment  DOCUMENTATION CODES:   Obesity unspecified  INTERVENTION:   Continue Vital High Protein @ 40 ml/hr  60 ml Prostat TID.    Tube feeding regimen provides 1560 kcal, 174 grams of protein, and 802 ml of H2O.    NUTRITION DIAGNOSIS:   Inadequate oral intake related to inability to eat as evidenced by NPO status.  GOAL:   Provide needs based on ASPEN/SCCM guidelines  MONITOR:   Vent status, Labs, Weight trends, TF tolerance, I & O's  REASON FOR ASSESSMENT:   Consult Enteral/tube feeding initiation and management  ASSESSMENT:   44yo male with hx HTN, DM presented 10/15 after witnessed arrest.  Patient is currently intubated on ventilator support MV: 12.6 L/min Temp (24hrs), Avg:98.4 F (36.9 C), Min:97.7 F (36.5 C), Max:99.1 F (37.3 C)   OG tube present.  Labs reviewed: TG: 474, CBG's: 135-218 Unable to complete Nutrition-Focused physical exam at this time.   Diet Order:  Diet NPO time specified  Skin:  Reviewed, no issues  Last BM:    NPO  Height:   Ht Readings from Last 1 Encounters:  01/22/15 6\' 2"  (1.88 m)   Weight:   Wt Readings from Last 1 Encounters:  01/24/15 255 lb 15.3 oz (116.1 kg)   Ideal Body Weight:  86.3 kg  BMI:  Body mass index is 32.85 kg/(m^2).  Estimated Nutritional Needs:   Kcal:  [id number]  Protein:  >/= 172 grams   Fluid:  > 1.5 L/day  EDUCATION NEEDS:   No education needs identified at this time  Kendell BaneHeather Jerad Dunlap RD, LDN, CNSC [phone] Pager [phone] After Hours Pager

## 2015-01-24 NOTE — Progress Notes (Signed)
PULMONARY / CRITICAL CARE MEDICINE   Name: Albert Jensen MRN: [id number] DOB: [birth year]    ADMISSION DATE:  01/22/2015 CONSULTATION DATE:  10/15  REFERRING MD :  Isabel Caprice   CHIEF COMPLAINT:  Cardiac arrest   INITIAL PRESENTATION: 44yo male with hx HTN, DM presented 10/15 after witnessed arrest.  Initial rhythm PEA on EMS arrival with approx 25 mins CPR before ROSC (15 mins bystander and 10 mins with EMS). EKG concerning for STEMI, after negative head CT he was taken urgently to cath lab and PCCM consulted for vent management/hypothermia protocol.   STUDIES:  CT head/c-spine 10/15>>>neg acute  SIGNIFICANT EVENTS: 10/15>cath lab  SUBJECTIVE:   Eyes opened to sternal rub but not to command.  VITAL SIGNS: Temp:  [96.1 F (35.6 C)-98.6 F (37 C)] 98.2 F (36.8 C) (10/17 0800) Pulse Rate:  [95-116] 116 (10/17 1000) Resp:  [14-23] 22 (10/17 1000) BP: (93-128)/(55-85) 119/67 mmHg (10/17 1000) SpO2:  [95 %-100 %] 95 % (10/17 1000) Arterial Line BP: (89-108)/(52-64) 106/58 mmHg (10/16 1730) FiO2 (%):  [30 %-40 %] 30 % (10/17 0800) Weight:  [116.1 kg (255 lb 15.3 oz)] 116.1 kg (255 lb 15.3 oz) (10/17 0405)  HEMODYNAMICS: CVP:  [8 mmHg-10 mmHg] 8 mmHg  VENTILATOR SETTINGS: Vent Mode:  [-] PRVC FiO2 (%):  [30 %-40 %] 30 % Set Rate:  [22 bmp] 22 bmp Vt Set:  [570 mL] 570 mL PEEP:  [5 cmH20] 5 cmH20 Plateau Pressure:  [21 cmH20-23 cmH20] 22 cmH20 INTAKE / OUTPUT:  Intake/Output Summary (Last 24 hours) at 01/24/15 1058 Last data filed at 01/24/15 1000  Gross per 24 hour  Intake 1814.98 ml  Output    585 ml  Net 1229.98 ml   PHYSICAL EXAMINATION: General:  wdwn male, appears older than stated age Neuro:  Agitated intermittently and ?purposeful pulling at ETT but not following commands, MAE,  HEENT:  PERRL, mm moist, ETT< C-collar  Cardiovascular:  RRR, Nl S1/S2, -M/R/G. Lungs:  Resps even non labored on vent, few scattered rhonchi  Abdomen:  Round, soft, hypoactive bs   Musculoskeletal:  Warm and dry, multiple large areas plaque psoriasis   LABS:  CBC  Recent Labs Lab 01/22/15 1128 01/22/15 1210 01/23/15 0318 01/24/15 0430  WBC 12.6*  --  11.9* 9.9  HGB 15.1 16.0 15.3 13.4  HCT 44.0 47.0 44.6 40.0  PLT 264  --  250 178   Coag's  Recent Labs Lab 01/22/15 1128 01/22/15 1430 01/22/15 2200  APTT  --  87*  --   INR 1.09 3.06* 1.08   BMET  Recent Labs Lab 01/22/15 1430 01/23/15 0318 01/24/15 0430  NA 135 138 141  K 4.2 3.4* 4.1  CL 103 106 107  CO2 19* 24 23  BUN CREATININE 1.11 0.87 1.02  GLUCOSE 511* 77 147*   Electrolytes  Recent Labs Lab 01/22/15 1430 01/23/15 0318 01/24/15 0430  CALCIUM 8.1* 8.3* 8.5*  MG  --  2.0  --   PHOS  --  2.7  --    Sepsis Markers No results for input(s): LATICACIDVEN, PROCALCITON, O2SATVEN in the last 168 hours. ABG  Recent Labs Lab 01/22/15 1520 01/23/15 0423 01/23/15 1200  PHART 7.268* 7.279* 7.353  PCO2ART 37.9 51.3* 42.6  PO2ART 254* 95.1 113*   Liver Enzymes No results for input(s): AST, ALT, ALKPHOS, BILITOT, ALBUMIN in the last 168 hours. Cardiac Enzymes  Recent Labs Lab 01/22/15 2200 01/23/15 0318 01/23/15 0815  TROPONINI 7.54* 10.30* 11.56*  Glucose  Recent Labs Lab 01/23/15 1214 01/23/15 1518 01/23/15 1941 01/23/15 2335 01/24/15 0427 01/24/15 0733  GLUCAP 145* 158* 163* 157* 135* 154*    Imaging Dg Chest Port 1 View  01/23/2015  CLINICAL DATA:  Encounter for central line placement. Hypertension diabetes. EXAM: PORTABLE CHEST 1 VIEW COMPARISON:  Earlier today at 0500 hours. FINDINGS: 1346 hours. Endotracheal tube terminates 2.9 cm above carina. Nasogastric extends beyond the inferior aspect of the film. Left internal jugular line tip at low SVC. Mildly degraded exam due to AP portable technique and patient body habitus. Normal heart size for level of inspiration. Left costophrenic angle minimally excluded. No pleural effusion or pneumothorax.  No congestive failure. Low lung volumes with resultant pulmonary interstitial prominence. IMPRESSION: Left internal jugular line appropriately positioned, without pneumothorax. Low lung volumes, without acute disease. Electronically Signed   By: Jeronimo GreavesKyle  Talbot M.D.   On: 01/23/2015 14:17   ASSESSMENT / PLAN:  PULMONARY OETT 10/15>>> Acute respiratory failure - post cardiac arrest  Mild respiratory acidosis  P:   Vent support - 8cc/kg  F/u CXR  F/u ABG SBT when meets criteria after normothermia protocol is complete. Adjust vent for ABG.  CARDIOVASCULAR CVL L IJ 10/16>>> Cardiac arrest - PEA  STEMI  RCA occlusion - s/p stenting 10/15 P:  Anticoagulation per cards  2D echo not ordered (delayed until patient is able to comply with tech to get a good study) but EF is 55-65% on cath. ASA. Normothermia protocol, ends at 5:30 PM on Tuesday. Hold home lisinopril, tricor, HCTZ  RENAL Hypokalemia  P:   F/u chem  KVO IVF.  GASTROINTESTINAL No active issue  P:   PPI. Continue TF.  HEMATOLOGIC No active issue  P:  Anticoagulation per cards  F/u CBC SQ heparin   INFECTIOUS UTI  P:   Urine culture 10/16>>>  Rocephin 10/16>>>  ENDOCRINE Hx DM  P:   Insulin gtt - transition off to lantus, SSI  Hold home metformin   NEUROLOGIC AMS - post cardiac arrest  Fall - head CT neg  Shivering  P:   RASS goal: -2 Fentanyl, versed gtt  Normothermia protocol - no paralytic gtt  EEG pending (just got done 10/17). Will need to clear c-spine once awake   FAMILY  - Updates:  Family updated bedside, full code for now.   The patient is critically ill with multiple organ systems failure and requires high complexity decision making for assessment and support, frequent evaluation and titration of therapies, application of advanced monitoring technologies and extensive interpretation of multiple databases.   Critical Care Time devoted to patient care services described in this note is   35  Minutes. This time reflects time of care of this signee Dr Koren BoundWesam Yacoub. This critical care time does not reflect procedure time, or teaching time or supervisory time of PA/NP/Med student/Med Resident etc but could involve care discussion time.  Alyson ReedyWesam G. Yacoub, M.D. [facility]Critical [facility]. Pager: [phone]. After hours pager: [phone].  01/24/2015  10:58 AM

## 2015-01-24 NOTE — Progress Notes (Signed)
1100 Dr. Molli KnockYacoub informed of low UOP over night during rounds, order for IVF received. Will cont to monitor closely.  1530 Still little to no urine in foley, foley irrigated due to sediments observed in tubing. Bladder scan done with 200 being the highest amount observed. Will cont to monitor.

## 2015-01-24 NOTE — Progress Notes (Signed)
PROGRESS NOTE  Subjective:   44 yo man, no previos hx of CAD Admitted 10/15 with cardiac arrest.  Cath showed no significant disease in the LM, LAD, Lcx. RCA had a thrombotic lesion .   Had thrombectomy and stenting .  Had the Artic sun cooling protocol.  Now has rewarmed.  Sedation is being cut in half. He is still sedated.    Objective:    Vital Signs:   Temp:  [96.1 F (35.6 C)-98.6 F (37 C)] 98.4 F (36.9 C) (10/17 0400) Pulse Rate:  [95-114] 101 (10/17 0700) Resp:  [14-23] 22 (10/17 0700) BP: (93-122)/(55-85) 101/60 mmHg (10/17 0700) SpO2:  [96 %-100 %] 96 % (10/17 0700) Arterial Line BP: (89-108)/(52-64) 106/58 mmHg (10/16 1730) FiO2 (%):  [30 %-40 %] 30 % (10/17 0758) Weight:  [116.1 kg (255 lb 15.3 oz)] 116.1 kg (255 lb 15.3 oz) (10/17 0405)  Last BM Date: 01/22/15   24-hour weight change: Weight change: 4.9 kg (10 lb 12.8 oz)  Weight trends: Filed Weights   01/22/15 1410 01/24/15 0405  Weight: 111.2 kg (245 lb 2.4 oz) 116.1 kg (255 lb 15.3 oz)    Intake/Output:  10/16 0701 - 10/17 0700 In: 1712.2 [I.V.:1182.2; NG/GT:330; IV Piggyback:200] Out: 685 [Urine:685]     Physical Exam: BP 101/60 mmHg  Pulse 101  Temp(Src) 98.4 F (36.9 C) (Core (Comment))  Resp 22  Ht  (1.88 m)  Wt 116.1 kg (255 lb 15.3 oz)  BMI 32.85 kg/m2  SpO2 96%  Wt Readings from Last 3 Encounters:  01/24/15 116.1 kg (255 lb 15.3 oz)  03/05/12 130.364 kg (287 lb 6.4 oz)  05/24/10 125.193 kg (276 lb)    General: Vital signs reviewed and noted. On the vent   Head: Abrasion on back of his head   Eyes: Not assessed.   Throat: ETT in place   Neck:  normal   Lungs:    on vent   Heart:  RR , tachy  Abdomen:  Soft, non-tender, non-distended    Extremities: No edema   Neurologic: Sedated   Psych: NA    Labs: BMET:  Recent Labs  01/23/15 0318 01/24/15 0430  NA 138 141  K 3.4* 4.1  CL 106 107  CO2 24 23  GLUCOSE 77 147*  BUN 14 17  CREATININE 0.87  1.02  CALCIUM 8.3* 8.5*  MG 2.0  --   PHOS 2.7  --     Liver function tests: No results for input(s): AST, ALT, ALKPHOS, BILITOT, PROT, ALBUMIN in the last 72 hours. No results for input(s): LIPASE, AMYLASE in the last 72 hours.  CBC:  Recent Labs  01/22/15 1128  01/23/15 0318 01/24/15 0430  WBC 12.6*  --  11.9* 9.9  NEUTROABS 7.3  --   --   --   HGB 15.1  < > 15.3 13.4  HCT 44.0  < > 44.6 40.0  MCV 86.4  --  85.4 87.5  PLT 264  --  250 178  < > = values in this interval not displayed.  Cardiac Enzymes:  Recent Labs  01/22/15 1430 01/22/15 2200 01/23/15 0318 01/23/15 0815  TROPONINI 2.66* 7.54* 10.30* 11.56*    Coagulation Studies:  Recent Labs  01/22/15 1128 01/22/15 1430 01/22/15 2200  LABPROT 14.3 31.1* 14.2  INR 1.09 3.06* 1.08    Other: Invalid input(s): POCBNP No results for input(s): DDIMER in the last 72 hours. No results for input(s): HGBA1C in the last  72 hours.  Recent Labs  01/24/15 0430  CHOL 188  HDL 29*  LDLCALC UNABLE TO CALCULATE IF TRIGLYCERIDE OVER 400 mg/dL  TRIG 191474*  CHOLHDL 6.5   No results for input(s): TSH, T4TOTAL, T3FREE, THYROIDAB in the last 72 hours.  Invalid input(s): FREET3 No results for input(s): VITAMINB12, FOLATE, FERRITIN, TIBC, IRON, RETICCTPCT in the last 72 hours.   Other results:  EKG  ( personally reviewed )  -  NSR, no ST changes.  The anterior lateral ST elevation has resolved.   Medications:    Infusions: . fentaNYL infusion INTRAVENOUS 400 mcg/hr (01/24/15 0626)  . midazolam (VERSED) infusion 8 mg/hr (01/24/15 0626)  . norepinephrine (LEVOPHED) Adult infusion      Scheduled Medications: . antiseptic oral rinse  7 mL Mouth Rinse 10 times per day  . aspirin  81 mg Per Tube Daily  . atorvastatin  80 mg Oral q1800  . cefTRIAXone (ROCEPHIN)  IV  1 g Intravenous Q24H  . chlorhexidine gluconate  15 mL Mouth Rinse BID  . enoxaparin (LOVENOX) injection  40 mg Subcutaneous Q24H  . feeding  supplement (VITAL HIGH PROTEIN)  1,000 mL Per Tube Q24H  . insulin aspart  2-6 Units Subcutaneous 6 times per day  . insulin glargine  25 Units Subcutaneous Q24H  . pantoprazole sodium  40 mg Per Tube Daily  . sodium chloride  3 mL Intravenous Q12H  . ticagrelor  90 mg Per Tube BID    Assessment/ Plan:   Active Problems:   Cardiac arrest (HCC)   Acute MI, inferolateral wall, initial episode of care (HCC)  1.  CAD - s/p thrombectomy and stenting of the RCA No significant lesion on the left side.  Continue DAPT for 1 year Will start Coreg 3.125 BID    2. Cardiac arrest.  Further management per PCCM.  Levophed has been turned off.  3. Hyperlipidemia:  Continue atorvastatin   4. DM :  Continue insulin therapy .    Disposition:  Length of Stay: 2  Vesta MixerPhilip J. Nahser, Montez HagemanJr., MD, [facility] 01/24/2015, 9:03 AM Office [phone] Pager [phone]

## 2015-01-25 ENCOUNTER — Ambulatory Visit (HOSPITAL_COMMUNITY): Payer: Managed Care, Other (non HMO)

## 2015-01-25 ENCOUNTER — Inpatient Hospital Stay (HOSPITAL_COMMUNITY): Payer: Managed Care, Other (non HMO)

## 2015-01-25 DIAGNOSIS — I213 ST elevation (STEMI) myocardial infarction of unspecified site: Secondary | ICD-10-CM | POA: Diagnosis present

## 2015-01-25 DIAGNOSIS — J9601 Acute respiratory failure with hypoxia: Secondary | ICD-10-CM | POA: Diagnosis present

## 2015-01-25 DIAGNOSIS — G931 Anoxic brain damage, not elsewhere classified: Secondary | ICD-10-CM | POA: Diagnosis present

## 2015-01-25 DIAGNOSIS — I469 Cardiac arrest, cause unspecified: Secondary | ICD-10-CM

## 2015-01-25 LAB — BASIC METABOLIC PANEL
Anion gap: 5 (ref 5–15)
BUN: 23 mg/dL — ABNORMAL HIGH (ref 6–20)
CHLORIDE: 112 mmol/L — AB (ref 101–111)
CO2: 24 mmol/L (ref 22–32)
CREATININE: 0.92 mg/dL (ref 0.61–1.24)
Calcium: 8.5 mg/dL — ABNORMAL LOW (ref 8.9–10.3)
GFR calc non Af Amer: >60 mL/min (ref >60)
Glucose, Bld: 240 mg/dL — ABNORMAL HIGH (ref 65–99)
POTASSIUM: 3.9 mmol/L (ref 3.5–5.1)
SODIUM: 141 mmol/L (ref 135–145)

## 2015-01-25 LAB — BLOOD GAS, ARTERIAL
ACID-BASE DEFICIT: 4.3 mmol/L — AB (ref 0.0–2.0)
Acid-base deficit: 2.7 mmol/L — ABNORMAL HIGH (ref 0.0–2.0)
BICARBONATE: 20.7 meq/L (ref 20.0–24.0)
Bicarbonate: 21.9 mEq/L (ref 20.0–24.0)
DRAWN BY: 12971
Drawn by: 252031
FIO2: 0.3
FIO2: 0.3
LHR: 22 {breaths}/min
O2 Saturation: 97 %
O2 Saturation: 94.5 %
PATIENT TEMPERATURE: 98.6
PCO2 ART: 39.7 mmHg (ref 35.0–45.0)
PEEP/CPAP: 5 cmH2O
PEEP/CPAP: 5 cmH2O
PO2 ART: 87.8 mmHg (ref 80.0–100.0)
PO2 ART: 68.8 mmHg — AB (ref 80.0–100.0)
PRESSURE CONTROL: 15 cmH2O
Patient temperature: 98.6
RATE: 12 resp/min
TCO2: 21.9 mmol/L (ref 0–100)
TCO2: 23.1 mmol/L (ref 0–100)
VT: 570 mL
pCO2 arterial: 40.6 mmHg (ref 35.0–45.0)
pH, Arterial: 7.327 — ABNORMAL LOW (ref 7.350–7.450)
pH, Arterial: 7.361 (ref 7.350–7.450)

## 2015-01-25 LAB — GLUCOSE, CAPILLARY
GLUCOSE-CAPILLARY: 224 mg/dL — AB (ref 65–99)
GLUCOSE-CAPILLARY: 224 mg/dL — AB (ref 65–99)
Glucose-Capillary: 268 mg/dL — ABNORMAL HIGH (ref 65–99)
Glucose-Capillary: 223 mg/dL — ABNORMAL HIGH (ref 65–99)
Glucose-Capillary: 246 mg/dL — ABNORMAL HIGH (ref 65–99)

## 2015-01-25 LAB — CBC
HEMATOCRIT: 35.9 % — AB (ref 39.0–52.0)
HEMOGLOBIN: 11.9 g/dL — AB (ref 13.0–17.0)
MCH: 29 pg (ref 26.0–34.0)
MCHC: 33.1 g/dL (ref 30.0–36.0)
MCV: 87.6 fL (ref 78.0–100.0)
Platelets: 210 10*3/uL (ref 150–400)
RBC: 4.1 MIL/uL — AB (ref 4.22–5.81)
RDW: 14.2 % (ref 11.5–15.5)
WBC: 9 10*3/uL (ref 4.0–10.5)

## 2015-01-25 LAB — PHOSPHORUS: PHOSPHORUS: 2 mg/dL — AB (ref 2.5–4.6)

## 2015-01-25 LAB — MAGNESIUM: MAGNESIUM: 2 mg/dL (ref 1.7–2.4)

## 2015-01-25 MED ORDER — CARVEDILOL 6.25 MG PO TABS
6.2500 mg | ORAL_TABLET | Freq: Two times a day (BID) | ORAL | Status: DC
  Administered 2015-01-25 – 2015-01-26 (×2): 6.25 mg via ORAL
  Filled 2015-01-25 (×2): qty 1

## 2015-01-25 NOTE — Progress Notes (Signed)
PROGRESS NOTE  Subjective:   44 yo man, no previos hx of CAD Admitted 10/15 with cardiac arrest.  Cath showed no significant disease in the LM, LAD, Lcx. RCA had a thrombotic lesion .   Had thrombectomy and stenting .  Had the Artic sun cooling protocol.  Now has rewarmed.  Sedation is being cut in half again this am - still no significant response to stimuli He is still sedated.  Getting echo currently.  LV function appears to be well preserved.   Objective:    Vital Signs:   Temp:  [97.7 F (36.5 C)-99.3 F (37.4 C)] 99 F (37.2 C) (10/18 0724) Pulse Rate:  [97-125] 105 (10/18 0830) Resp:  [17-25] 22 (10/18 0830) BP: (95-213)/(58-124) 122/76 mmHg (10/18 0830) SpO2:  [94 %-98 %] 97 % (10/18 0830) FiO2 (%):  [30 %] 30 % (10/18 0800) Weight:  [116.61 kg (257 lb 1.3 oz)] 116.61 kg (257 lb 1.3 oz) (10/18 0200)  Last BM Date: 01/22/15   24-hour weight change: Weight change: 0.51 kg (1 lb 2 oz)  Weight trends: Filed Weights   01/22/15 1410 01/24/15 0405 01/25/15 0200  Weight: 111.2 kg (245 lb 2.4 oz) 116.1 kg (255 lb 15.3 oz) 116.61 kg (257 lb 1.3 oz)    Intake/Output:  10/17 0701 - 10/18 0700 In: 3865.8 [I.V.:2585.8; NG/GT:1280] Out: 2300 [Urine:2250; Emesis/NG output:50] Total I/O In: 480 [I.V.:480] Out: 125 [Urine:125]   Physical Exam: BP 122/76 mmHg  Pulse 105  Temp(Src) 99 F (37.2 C) (Core (Comment))  Resp 22  Ht 6\' 2"  (1.88 m)  Wt 116.61 kg (257 lb 1.3 oz)  BMI 32.99 kg/m2  SpO2 97%  Wt Readings from Last 3 Encounters:  01/25/15 116.61 kg (257 lb 1.3 oz)  03/05/12 130.364 kg (287 lb 6.4 oz)  05/24/10 125.193 kg (276 lb)    General: Vital signs reviewed and noted. On the vent   Head: Abrasion on back of his head   Eyes: Not assessed.   Throat: ETT in place   Neck:  normal   Lungs:   on vent   Heart:  RR , tachy  Abdomen:  Soft, non-tender, non-distended    Extremities: No edema   Neurologic: Sedated   Psych: NA     Labs: BMET:  Recent Labs  01/23/15 0318 01/24/15 0430 01/25/15 0400  NA 138 141 141  K 3.4* 4.1 3.9  CL 106 107 112*  CO2 24 23 24   GLUCOSE 77 147* 240*  BUN 14 17 23*  CREATININE 0.87 1.02 0.92  CALCIUM 8.3* 8.5* 8.5*  MG 2.0  --  2.0  PHOS 2.7  --  2.0*    Liver function tests: No results for input(s): AST, ALT, ALKPHOS, BILITOT, PROT, ALBUMIN in the last 72 hours. No results for input(s): LIPASE, AMYLASE in the last 72 hours.  CBC:  Recent Labs  01/22/15 1128  01/24/15 0430 01/25/15 0400  WBC 12.6*  < > 9.9 9.0  NEUTROABS 7.3  --   --   --   HGB 15.1  < > 13.4 11.9*  HCT 44.0  < > 40.0 35.9*  MCV 86.4  < > 87.5 87.6  PLT 264  < > 178 210  < > = values in this interval not displayed.  Cardiac Enzymes:  Recent Labs  01/22/15 1430 01/22/15 2200 01/23/15 0318 01/23/15 0815  TROPONINI 2.66* 7.54* 10.30* 11.56*    Coagulation Studies:  Recent Labs  01/22/15 1128 01/22/15 1430  01/22/15 2200  LABPROT 14.3 31.1* 14.2  INR 1.09 3.06* 1.08    Other: Invalid input(s): POCBNP No results for input(s): DDIMER in the last 72 hours.  Recent Labs  01/23/15 0913  HGBA1C 12.3*    Recent Labs  01/24/15 0430  CHOL 188  HDL 29*  LDLCALC UNABLE TO CALCULATE IF TRIGLYCERIDE OVER 400 mg/dL  TRIG 284*  CHOLHDL 6.5   No results for input(s): TSH, T4TOTAL, T3FREE, THYROIDAB in the last 72 hours.  Invalid input(s): FREET3 No results for input(s): VITAMINB12, FOLATE, FERRITIN, TIBC, IRON, RETICCTPCT in the last 72 hours.   Other results:  EKG  ( personally reviewed )  -  NSR, no ST changes.  The anterior lateral ST elevation has resolved.   Medications:    Infusions: . sodium chloride 100 mL/hr at 01/25/15 0800  . fentaNYL infusion INTRAVENOUS 300 mcg/hr (01/25/15 0249)  . midazolam (VERSED) infusion 4 mg/hr (01/25/15 0230)  . norepinephrine (LEVOPHED) Adult infusion      Scheduled Medications: . antiseptic oral rinse  7 mL Mouth Rinse 10  times per day  . aspirin  81 mg Per Tube Daily  . atorvastatin  80 mg Oral q1800  . carvedilol  3.125 mg Oral BID WC  . cefTRIAXone (ROCEPHIN)  IV  1 g Intravenous Q24H  . chlorhexidine gluconate  15 mL Mouth Rinse BID  . enoxaparin (LOVENOX) injection  40 mg Subcutaneous Q24H  . feeding supplement (PRO-STAT SUGAR FREE 64)  60 mL Per Tube TID  . feeding supplement (VITAL HIGH PROTEIN)  1,000 mL Per Tube Q24H  . insulin aspart  0-15 Units Subcutaneous 6 times per day  . insulin glargine  30 Units Subcutaneous Q24H  . pantoprazole sodium  40 mg Per Tube Daily  . sodium chloride  3 mL Intravenous Q12H  . ticagrelor  90 mg Per Tube BID    Assessment/ Plan:   Active Problems:   Cardiac arrest (HCC)   Acute MI, inferolateral wall, initial episode of care (HCC)   Respiratory failure (HCC)  1.  CAD - s/p thrombectomy and stenting of the RCA No significant lesion on the left side.  Continue DAPT for 1 year Will increase Coreg to 6.25 BID   2. Cardiac arrest.  Further management per PCCM.  Levophed has been turned off. Will DC  3. Hyperlipidemia:  Continue atorvastatin   4. DM :  Continue insulin therapy .    Disposition:  Length of Stay: 3  Vesta Mixer, Montez Hageman., MD, [facility] 01/25/2015, 8:40 AM Office [phone] Pager [phone]

## 2015-01-25 NOTE — Care Management Note (Signed)
Case Management Note  Patient Details  Name: Albert Jensen MRN: [id number] Date of Birth: 01/04/1971  Subjective/Objective:     Pt adm w carrest on vent               Action/Plan: lives at home pta   Expected Discharge Date:                  Expected Discharge Plan:     In-House Referral:     Discharge planning Services  CM Consult, Indigent Health Clinic, Medication Assistance  Post Acute Care Choice:    Choice offered to:     DME Arranged:    DME Agency:     HH Arranged:    HH Agency:     Status of Service:     Medicare Important Message Given:    Date Medicare IM Given:    Medicare IM give by:    Date Additional Medicare IM Given:    Additional Medicare Important Message give by:     If discussed at Long Length of Stay Meetings, dates discussed:    Additional Comments:left pt brilinta 30day free and copay assist card on shadow chart alone w [facility]. Left pt  asssist form on shadow chart also as no ins listed.  Albert Jensen, Albert Puga T, RN 01/25/2015, 10:46 AM

## 2015-01-25 NOTE — Progress Notes (Signed)
  Echocardiogram 2D Echocardiogram has been performed.  Leta JunglingCooper, [place] Sherr M 01/25/2015, 9:10 AM

## 2015-01-25 NOTE — Progress Notes (Signed)
PULMONARY / CRITICAL CARE MEDICINE   Name: Albert Jensen MRN: [id number] DOB: 01/19/1971    ADMISSION DATE:  01/22/2015 CONSULTATION DATE:  10/15  REFERRING MD :  Isabel CapriceVaranassi   CHIEF COMPLAINT:  Cardiac arrest   INITIAL PRESENTATION: 44yo male with hx HTN, DM presented 10/15 after witnessed arrest.  Initial rhythm PEA on EMS arrival with approx 25 mins CPR before ROSC (15 mins bystander and 10 mins with EMS). EKG concerning for STEMI, after negative head CT he was taken urgently to cath lab and PCCM consulted for vent management/hypothermia protocol.   STUDIES:  CT head/c-spine 10/15>>>neg acute  SIGNIFICANT EVENTS: 10/15>cath lab  SUBJECTIVE:   Significant asynchrony with the ventilator this AM.  VITAL SIGNS: Temp:  [97.7 F (36.5 C)-99.3 F (37.4 C)] 99.3 F (37.4 C) (10/18 1100) Pulse Rate:  [97-125] 105 (10/18 1130) Resp:  [17-25] 24 (10/18 1130) BP: (95-213)/(58-124) 154/86 mmHg (10/18 1130) SpO2:  [92 %-98 %] 94 % (10/18 1130) FiO2 (%):  [30 %] 30 % (10/18 0820) Weight:  [116.61 kg (257 lb 1.3 oz)] 116.61 kg (257 lb 1.3 oz) (10/18 0200)  HEMODYNAMICS: CVP:  [8 mmHg] 8 mmHg  VENTILATOR SETTINGS: Vent Mode:  [-] PRVC FiO2 (%):  [30 %] 30 % Set Rate:  [22 bmp] 22 bmp Vt Set:  [570 mL] 570 mL PEEP:  [5 cmH20] 5 cmH20 Plateau Pressure:  [17 cmH20-20 cmH20] 18 cmH20 INTAKE / OUTPUT:  Intake/Output Summary (Last 24 hours) at 01/25/15 1151 Last data filed at 01/25/15 1128  Gross per 24 hour  Intake 4674.61 ml  Output   2950 ml  Net 1724.61 ml   PHYSICAL EXAMINATION: General:  wdwn male, appears older than stated age Neuro:  Agitated intermittently and ?purposeful pulling at ETT but not following commands, MAE,  HEENT:  PERRL, mm moist, ETT< C-collar  Cardiovascular:  RRR, Nl S1/S2, -M/R/G. Lungs:  Resps even non labored on vent, few scattered rhonchi  Abdomen:  Round, soft, hypoactive bs  Musculoskeletal:  Warm and dry, multiple large areas plaque psoriasis    LABS:  CBC  Recent Labs Lab 01/23/15 0318 01/24/15 0430 01/25/15 0400  WBC 11.9* 9.9 9.0  HGB 15.3 13.4 11.9*  HCT 44.6 40.0 35.9*  PLT 250 178 210   Coag's  Recent Labs Lab 01/22/15 1128 01/22/15 1430 01/22/15 2200  APTT  --  87*  --   INR 1.09 3.06* 1.08   BMET  Recent Labs Lab 01/23/15 0318 01/24/15 0430 01/25/15 0400  NA 138 141 141  K 3.4* 4.1 3.9  CL 106 107 112*  CO2 24 23 24   BUN 14 17 23*  CREATININE 0.87 1.02 0.92  GLUCOSE 77 147* 240*   Electrolytes  Recent Labs Lab 01/23/15 0318 01/24/15 0430 01/25/15 0400  CALCIUM 8.3* 8.5* 8.5*  MG 2.0  --  2.0  PHOS 2.7  --  2.0*   Sepsis Markers No results for input(s): LATICACIDVEN, PROCALCITON, O2SATVEN in the last 168 hours. ABG  Recent Labs Lab 01/23/15 0423 01/23/15 1200 01/25/15 0358  PHART 7.279* 7.353 7.327*  PCO2ART 51.3* 42.6 40.6  PO2ART 95.1 113* 87.8   Liver Enzymes No results for input(s): AST, ALT, ALKPHOS, BILITOT, ALBUMIN in the last 168 hours. Cardiac Enzymes  Recent Labs Lab 01/22/15 2200 01/23/15 0318 01/23/15 0815  TROPONINI 7.54* 10.30* 11.56*   Glucose  Recent Labs Lab 01/24/15 1610 01/24/15 2017 01/24/15 2335 01/25/15 0344 01/25/15 0759 01/25/15 1112  GLUCAP 225* 225* 262* 224* 224* 268*  Imaging Dg Chest Port 1 View  01/25/2015  CLINICAL DATA:  Intubation. EXAM: PORTABLE CHEST 1 VIEW COMPARISON:  01/23/2015. FINDINGS: Endotracheal tube, NG tube, left IJ line in stable position. Heart size stable. Mild pulmonary venous congestion. Low lung volumes with bibasilar atelectasis. No pleural effusion or pneumothorax . IMPRESSION: 1. Lines and tubes in stable position. 2. Mild pulmonary venous congestion.  Heart size stable. 3. Low lung volumes with mild bibasilar atelectasis. Electronically Signed   By: Maisie Fus  Register   On: 01/25/2015 07:47   ASSESSMENT / PLAN:  PULMONARY OETT 10/15>>> Acute respiratory failure - post cardiac arrest  Mild  respiratory acidosis  P:   Change to PCV this AM for asynchrony concerns then ABG in 30 minutes. F/u CXR. F/u ABG. SBT when meets criteria after normothermia protocol is complete. Adjust vent for ABG.  CARDIOVASCULAR CVL L IJ 10/16>>> Cardiac arrest - PEA  STEMI  RCA occlusion - s/p stenting 10/15 P:  Anticoagulation per cards  2D echo not ordered (delayed until patient is able to comply with tech to get a good study) but EF is 55-65% on cath. ASA. Normothermia protocol, ends at 5:30 PM on Tuesday. Hold home lisinopril, tricor, HCTZ  RENAL Hypokalemia  P:   F/u chem. KVO IVF. Replace electrolytes as indicated.  GASTROINTESTINAL No active issue  P:   PPI. Continue TF.  HEMATOLOGIC No active issue  P:  Anticoagulation per cards  F/u CBC SQ heparin   INFECTIOUS UTI  P:   Urine culture 10/16>>>  Rocephin 10/16>>>  ENDOCRINE Hx DM  P:   Off insulin drip. Resistant scale and lantus 30. Hold home metformin.  NEUROLOGIC AMS - post cardiac arrest  Fall - head CT neg  Shivering  P:   RASS goal: -2 Fentanyl, versed gtt  Normothermia protocol - no paralytic gtt  EEG consistent with encephalopathy. Will need to clear c-spine once awake.  FAMILY  - Updates: Brother updated bedside.   The patient is critically ill with multiple organ systems failure and requires high complexity decision making for assessment and support, frequent evaluation and titration of therapies, application of advanced monitoring technologies and extensive interpretation of multiple databases.   Critical Care Time devoted to patient care services described in this note is  35  Minutes. This time reflects time of care of this signee Dr Koren Bound. This critical care time does not reflect procedure time, or teaching time or supervisory time of PA/NP/Med student/Med Resident etc but could involve care discussion time.  Alyson Reedy, M.D. [facility] Pulmonary/Critical Care  Medicine. Pager: [phone]. After hours pager: [phone].  01/25/2015  11:51 AM

## 2015-01-25 NOTE — Progress Notes (Signed)
Pt had minimal urine output throughout shift (approximately 10 mL.) At [place] noted large amount of urine in bed, leaking from around the Foley site. Performed bladder scan due to oliguria and distended/taut abdomen. Bladder scan revealed >999. Tried to irrigate Foley but met great resistance. Attached empty syringe to balloon port to assess balloon volume. Removed total of 15 mL saline from Foley catheter balloon. Urine immediately began draining through tubing. Proper amount of saline (10 mL) instilled into balloon. Total of 1750 mL yellow urine with sediment drained out at this time. Pt's abdomen much softer and less distended. Will continue monitoring. Johnsie Cancel, RN

## 2015-01-26 ENCOUNTER — Inpatient Hospital Stay (HOSPITAL_COMMUNITY): Payer: Managed Care, Other (non HMO)

## 2015-01-26 ENCOUNTER — Encounter (HOSPITAL_COMMUNITY): Payer: Self-pay

## 2015-01-26 DIAGNOSIS — I2129 ST elevation (STEMI) myocardial infarction involving other sites: Secondary | ICD-10-CM

## 2015-01-26 LAB — CBC
HEMATOCRIT: 32.8 % — AB (ref 39.0–52.0)
Hemoglobin: 10.6 g/dL — ABNORMAL LOW (ref 13.0–17.0)
MCH: 29 pg (ref 26.0–34.0)
MCHC: 32.3 g/dL (ref 30.0–36.0)
MCV: 89.6 fL (ref 78.0–100.0)
PLATELETS: 208 10*3/uL (ref 150–400)
RBC: 3.66 MIL/uL — AB (ref 4.22–5.81)
RDW: 14.7 % (ref 11.5–15.5)
WBC: 6.6 10*3/uL (ref 4.0–10.5)

## 2015-01-26 LAB — BLOOD GAS, ARTERIAL
Acid-base deficit: 0.7 mmol/L (ref 0.0–2.0)
Bicarbonate: 24 mEq/L (ref 20.0–24.0)
DRAWN BY: 252031
FIO2: 0.3
LHR: 12 {breaths}/min
O2 SAT: 95.1 %
PEEP/CPAP: 5 cmH2O
PRESSURE CONTROL: 15 cmH2O
Patient temperature: 98.6
TCO2: 25.3 mmol/L (ref 0–100)
pCO2 arterial: 43.4 mmHg (ref 35.0–45.0)
pH, Arterial: 7.362 (ref 7.350–7.450)
pO2, Arterial: 72.4 mmHg — ABNORMAL LOW (ref 80.0–100.0)

## 2015-01-26 LAB — GLUCOSE, CAPILLARY
GLUCOSE-CAPILLARY: 267 mg/dL — AB (ref 65–99)
GLUCOSE-CAPILLARY: 279 mg/dL — AB (ref 65–99)
Glucose-Capillary: 201 mg/dL — ABNORMAL HIGH (ref 65–99)
Glucose-Capillary: 255 mg/dL — ABNORMAL HIGH (ref 65–99)
Glucose-Capillary: 261 mg/dL — ABNORMAL HIGH (ref 65–99)
Glucose-Capillary: 244 mg/dL — ABNORMAL HIGH (ref 65–99)

## 2015-01-26 LAB — BASIC METABOLIC PANEL
ANION GAP: 6 (ref 5–15)
BUN: 27 mg/dL — ABNORMAL HIGH (ref 6–20)
CALCIUM: 8.3 mg/dL — AB (ref 8.9–10.3)
CO2: 25 mmol/L (ref 22–32)
Chloride: 110 mmol/L (ref 101–111)
Creatinine, Ser: 0.81 mg/dL (ref 0.61–1.24)
GLUCOSE: 299 mg/dL — AB (ref 65–99)
POTASSIUM: 3.5 mmol/L (ref 3.5–5.1)
Sodium: 141 mmol/L (ref 135–145)

## 2015-01-26 LAB — PHOSPHORUS: PHOSPHORUS: 1.7 mg/dL — AB (ref 2.5–4.6)

## 2015-01-26 LAB — MAGNESIUM: Magnesium: 1.9 mg/dL (ref 1.7–2.4)

## 2015-01-26 MED ORDER — INFLUENZA VAC SPLIT QUAD 0.5 ML IM SUSY
0.5000 mL | PREFILLED_SYRINGE | INTRAMUSCULAR | Status: DC
  Filled 2015-01-26: qty 0.5

## 2015-01-26 MED ORDER — CARVEDILOL 6.25 MG PO TABS
6.2500 mg | ORAL_TABLET | Freq: Once | ORAL | Status: AC
  Administered 2015-01-26: 6.25 mg via ORAL
  Filled 2015-01-26: qty 1

## 2015-01-26 MED ORDER — CARVEDILOL 12.5 MG PO TABS
12.5000 mg | ORAL_TABLET | Freq: Two times a day (BID) | ORAL | Status: DC
Start: 2015-01-26 — End: 2015-01-26

## 2015-01-26 MED ORDER — POTASSIUM CHLORIDE 20 MEQ/15ML (10%) PO SOLN
40.0000 meq | Freq: Three times a day (TID) | ORAL | Status: AC
  Administered 2015-01-26 (×2): 40 meq
  Filled 2015-01-26 (×2): qty 30

## 2015-01-26 MED ORDER — LABETALOL HCL 5 MG/ML IV SOLN
10.0000 mg | INTRAVENOUS | Status: DC | PRN
  Administered 2015-01-26: 10 mg via INTRAVENOUS
  Filled 2015-01-26: qty 4

## 2015-01-26 MED ORDER — FUROSEMIDE 10 MG/ML IJ SOLN
40.0000 mg | Freq: Four times a day (QID) | INTRAMUSCULAR | Status: AC
  Administered 2015-01-26 – 2015-01-27 (×3): 40 mg via INTRAVENOUS
  Filled 2015-01-26 (×3): qty 4

## 2015-01-26 MED ORDER — PNEUMOCOCCAL VAC POLYVALENT 25 MCG/0.5ML IJ INJ
0.5000 mL | INJECTION | INTRAMUSCULAR | Status: DC
  Filled 2015-01-26: qty 0.5

## 2015-01-26 MED ORDER — CARVEDILOL 25 MG PO TABS
25.0000 mg | ORAL_TABLET | Freq: Two times a day (BID) | ORAL | Status: DC
  Administered 2015-01-26 – 2015-02-02 (×13): 25 mg via ORAL
  Filled 2015-01-26 (×13): qty 1

## 2015-01-26 NOTE — Progress Notes (Signed)
Patient not following commands this AM during wake up assessment. Dr. Molli KnockYacoub aware. Sedation to remain off and will continue to monitor. Labetalol PRN ordered for high blood pressure and cooling blanket also ordered for increased temp.

## 2015-01-26 NOTE — Progress Notes (Signed)
Inpatient Diabetes Program Recommendations  AACE/ADA: New Consensus Statement on Inpatient Glycemic Control (2015)  Target Ranges:  Prepandial:   less than 140 mg/dL      Peak postprandial:   less than 180 mg/dL (1-2 hours)      Critically ill patients:  140 - 180 mg/dL   Review of Glycemic Control  Diabetes history: DM 2 Outpatient Diabetes medications: reported not taking meds at home Current orders for Inpatient glycemic control: Lantus 30 units, Novolog Moderate Q4  Inpatient Diabetes Program Recommendations:   Insulin - Tube Fee Coverage: Patient on Vital HP tube feeds. Glucose in the mid 200's consistently. Please consider adding Novolog 3-5 units Q4 Tube Feed coverage in addition to correction.  Thanks,  Christena DeemShannon Devlin Mcveigh RN, MSN, [facility] Inpatient Diabetes Coordinator Team Pager [phone] (8a-5p)

## 2015-01-26 NOTE — Progress Notes (Signed)
RT note. HME changed and ballard. 

## 2015-01-26 NOTE — Progress Notes (Signed)
PULMONARY / CRITICAL CARE MEDICINE   Name: Albert Jensen MRN: [id number] DOB: [birth year]    ADMISSION DATE:  01/22/2015 CONSULTATION DATE:  10/15  REFERRING MD :  Isabel Caprice   CHIEF COMPLAINT:  Cardiac arrest   INITIAL PRESENTATION: 44yo male with hx HTN, DM presented 10/15 after witnessed arrest.  Initial rhythm PEA on EMS arrival with approx 25 mins CPR before ROSC (15 mins bystander and 10 mins with EMS). EKG concerning for STEMI, after negative head CT he was taken urgently to cath lab and PCCM consulted for vent management/hypothermia protocol.   STUDIES:  CT head/c-spine 10/15>>>neg acute  SIGNIFICANT EVENTS: 10/15>cath lab  SUBJECTIVE:   Significant asynchrony with the ventilator this AM.  VITAL SIGNS: Temp:  [98.6 F (37 C)-101.1 F (38.4 C)] 101.1 F (38.4 C) (10/19 1000) Pulse Rate:  [64-121] 121 (10/19 1020) Resp:  [10-30] 22 (10/19 1000) BP: (100-209)/(52-110) 158/67 mmHg (10/19 1030) SpO2:  [91 %-97 %] 92 % (10/19 1000) FiO2 (%):  [30 %] 30 % (10/19 0815) Weight:  [120.4 kg (265 lb 6.9 oz)] 120.4 kg (265 lb 6.9 oz) (10/19 0300)  HEMODYNAMICS: CVP:  [12 mmHg] 12 mmHg  VENTILATOR SETTINGS: Vent Mode:  [-] PCV FiO2 (%):  [30 %] 30 % Set Rate:  [12 bmp] 12 bmp PEEP:  [5 cmH20] 5 cmH20 Plateau Pressure:  [0 cmH20-21 cmH20] 21 cmH20 INTAKE / OUTPUT:  Intake/Output Summary (Last 24 hours) at 01/26/15 1132 Last data filed at 01/26/15 1000  Gross per 24 hour  Intake 4159.96 ml  Output   2730 ml  Net 1429.96 ml   PHYSICAL EXAMINATION: General:  wdwn male, appears older than stated age Neuro:  Agitated intermittently and purposeful pulling at ETT, moves all 4 ext to command  HEENT:  PERRL, mm moist, ETT< C-collar  Cardiovascular:  RRR, Nl S1/S2, -M/R/G. Lungs:  Resps even non labored on vent, few scattered rhonchi  Abdomen:  Round, soft, hypoactive bs  Musculoskeletal:  Warm and dry, multiple large areas plaque psoriasis   LABS:  CBC  Recent Labs Lab  01/24/15 0430 01/25/15 0400 01/26/15 0448  WBC 9.9 9.0 6.6  HGB 13.4 11.9* 10.6*  HCT 40.0 35.9* 32.8*  PLT 178 210 208   Coag's  Recent Labs Lab 01/22/15 1128 01/22/15 1430 01/22/15 2200  APTT  --  87*  --   INR 1.09 3.06* 1.08   BMET  Recent Labs Lab 01/24/15 0430 01/25/15 0400 01/26/15 0448  NA 141 141 141  K 4.1 3.9 3.5  CL 107 112* 110  CO2 BUN 17 23* 27*  CREATININE 1.02 0.92 0.81  GLUCOSE 147* 240* 299*   Electrolytes  Recent Labs Lab 01/23/15 0318 01/24/15 0430 01/25/15 0400 01/26/15 0448  CALCIUM 8.3* 8.5* 8.5* 8.3*  MG 2.0  --  2.0 1.9  PHOS 2.7  --  2.0* 1.7*   Sepsis Markers No results for input(s): LATICACIDVEN, PROCALCITON, O2SATVEN in the last 168 hours. ABG  Recent Labs Lab 01/25/15 0358 01/25/15 1315 01/26/15 0340  PHART 7.327* 7.361 7.362  PCO2ART 40.6 39.7 43.4  PO2ART 87.8 68.8* 72.4*   Liver Enzymes No results for input(s): AST, ALT, ALKPHOS, BILITOT, ALBUMIN in the last 168 hours. Cardiac Enzymes  Recent Labs Lab 01/22/15 2200 01/23/15 0318 01/23/15 0815  TROPONINI 7.54* 10.30* 11.56*   Glucose  Recent Labs Lab 01/25/15 1112 01/25/15 1540 01/25/15 2013 01/25/15 2306 01/26/15 0450 01/26/15 0747  GLUCAP 268* 223* 201* 246* 267* 255*  Imaging Dg Chest Port 1 View  01/26/2015  CLINICAL DATA:  Intubation. EXAM: PORTABLE CHEST 1 VIEW COMPARISON:  01/25/2015. FINDINGS: Endotracheal tube and NG tube in good anatomic position. Cardiomegaly with mild pulmonary venous congestion. Persistent but improving bilateral basilar atelectasis and/or infiltrates. No pleural effusion or pneumothorax. IMPRESSION: 1. Lines and tubes stable position. 2. Cardiomegaly with mild pulmonary venous congestion. 3. Low lung volumes with interim partial clearing of bibasilar atelectasis and/or infiltrates. Electronically Signed   By: Maisie Fushomas  Register   On: 01/26/2015 07:15   ASSESSMENT / PLAN:  PULMONARY OETT 10/15>>> Acute  respiratory failure - post cardiac arrest  Patient has multiple ribs dislocated from his sternum and every time he is placed on a wean internal rebreathing is noted. P:   Changed to PCV for asynchrony concerns. F/u CXR. F/u ABG. SBT with likely extubation Friday pending his flail chest on exam. Adjust vent for ABG.  CARDIOVASCULAR CVL L IJ 10/16>>> Cardiac arrest - PEA  STEMI  RCA occlusion - s/p stenting 10/15 P:  Anticoagulation per cards  2D echo not ordered (delayed until patient is able to comply with tech to get a good study) but EF is 55-65% on cath. ASA. Normothermia protocol, ends at 5:30 PM on 10/18. Hold home lisinopril, tricor, HCTZ Increase Coreg to 25 BID  RENAL Hypokalemia  P:   F/u chem. KVO IVF. Replace electrolytes as indicated. Lasix 40 mg IV q6 x3 doses.  GASTROINTESTINAL No active issue  P:   PPI. Nutrition for TF.  HEMATOLOGIC No active issue  P:  Anticoagulation per cards. F/u CBC. SQ heparin.  INFECTIOUS UTI  P:   Urine culture 10/16>>>  Rocephin 10/16>>>  ENDOCRINE Hx DM  P:   Off insulin drip. Resistant scale and lantus 30. Hold home metformin.  NEUROLOGIC AMS - post cardiac arrest  Fall - head CT neg  Shivering  Patient is following commands. P:   RASS goal: -2 Fentanyl, versed gtt  Normothermia protocol - no paralytic gtt  EEG consistent with encephalopathy during sedation but off sedation patient is following command. Maintain sedated for now due to vent asynchrony. Will need to clear c-spine once awake.  FAMILY  - Updates: Family updated bedside.   The patient is critically ill with multiple organ systems failure and requires high complexity decision making for assessment and support, frequent evaluation and titration of therapies, application of advanced monitoring technologies and extensive interpretation of multiple databases.   Critical Care Time devoted to patient care services described in this note is  35   Minutes. This time reflects time of care of this signee Dr Koren BoundWesam Yacoub. This critical care time does not reflect procedure time, or teaching time or supervisory time of PA/NP/Med student/Med Resident etc but could involve care discussion time.  Alyson ReedyWesam G. Yacoub, M.D. [facility]. Pager: [phone]. After hours pager: [phone].  01/26/2015  11:32 AM

## 2015-01-26 NOTE — Progress Notes (Signed)
PROGRESS NOTE  Subjective:   44 yo man, no previos hx of CAD Admitted 10/15 with cardiac arrest.  Cath showed no significant disease in the LM, LAD, Lcx. RCA had a thrombotic lesion .   Had thrombectomy and stenting .  Had the [place] sun cooling protocol.  Now has rewarmed.  Sedation is being cut in half again this am - still no significant response to stimuli He is still sedated.    Echo from yesterday is basically normal  - mild diastolic dysfunction. Normal EF -65%   Objective:    Vital Signs:   Temp:  [98.6 F (37 C)-99.9 F (37.7 C)] 99.7 F (37.6 C) (10/19 0900) Pulse Rate:  [93-112] 112 (10/19 0900) Resp:  [10-30] 18 (10/19 0900) BP: (100-209)/(52-110) 184/88 mmHg (10/19 0938) SpO2:  [91 %-97 %] 92 % (10/19 0900) FiO2 (%):  [30 %] 30 % (10/19 0815) Weight:  [120.4 kg (265 lb 6.9 oz)] 120.4 kg (265 lb 6.9 oz) (10/19 0300)  Last BM Date: 01/22/15   24-hour weight change: Weight change: 3.79 kg (8 lb 5.7 oz)  Weight trends: Filed Weights   01/24/15 0405 01/25/15 0200 01/26/15 0300  Weight: 116.1 kg (255 lb 15.3 oz) 116.61 kg (257 lb 1.3 oz) 120.4 kg (265 lb 6.9 oz)    Intake/Output:  10/18 0701 - 10/19 0700 In: 4907.3 [I.V.:3737.3; NG/GT:1070; IV Piggyback:100] Out: 2800 [Urine:2800] Total I/O In: 421.5 [I.V.:251.5; NG/GT:170] Out: 350 [Urine:350]   Physical Exam: BP 184/88 mmHg  Pulse 112  Temp(Src) 99.7 F (37.6 C) (Axillary)  Resp 18  Ht  (1.88 m)  Wt 120.4 kg (265 lb 6.9 oz)  BMI 34.07 kg/m2  SpO2 92%  Wt Readings from Last 3 Encounters:  01/26/15 120.4 kg (265 lb 6.9 oz)  03/05/12 130.364 kg (287 lb 6.4 oz)  05/24/10 125.193 kg (276 lb)    General: Vital signs reviewed and noted. On the vent   Head: Abrasion on back of his head   Eyes: Not assessed.   Throat: ETT in place   Neck:  normal   Lungs:   on vent   Heart:  RR , tachy  Abdomen:  Soft, non-tender, non-distended    Extremities: No edema   Neurologic: Sedated     Psych: NA    Labs: BMET:  Recent Labs  01/25/15 0400 01/26/15 0448  NA 141 141  K 3.9 3.5  CL 112* 110  CO2 24 25  GLUCOSE 240* 299*  BUN 23* 27*  CREATININE 0.92 0.81  CALCIUM 8.5* 8.3*  MG 2.0 1.9  PHOS 2.0* 1.7*    Liver function tests: No results for input(s): AST, ALT, ALKPHOS, BILITOT, PROT, ALBUMIN in the last 72 hours. No results for input(s): LIPASE, AMYLASE in the last 72 hours.  CBC:  Recent Labs  01/25/15 0400 01/26/15 0448  WBC 9.0 6.6  HGB 11.9* 10.6*  HCT 35.9* 32.8*  MCV 87.6 89.6  PLT 210 208    Cardiac Enzymes: No results for input(s): CKTOTAL, CKMB, TROPONINI in the last 72 hours.  Coagulation Studies: No results for input(s): LABPROT, INR in the last 72 hours.  Other: Invalid input(s): POCBNP No results for input(s): DDIMER in the last 72 hours. No results for input(s): HGBA1C in the last 72 hours.  Recent Labs  01/24/15 0430  CHOL 188  HDL 29*  LDLCALC UNABLE TO CALCULATE IF TRIGLYCERIDE OVER 400 mg/dL  TRIG 960*  CHOLHDL 6.5   No results for  input(s): TSH, T4TOTAL, T3FREE, THYROIDAB in the last 72 hours.  Invalid input(s): FREET3 No results for input(s): VITAMINB12, FOLATE, FERRITIN, TIBC, IRON, RETICCTPCT in the last 72 hours.   Other results:  EKG  ( personally reviewed )  -  NSR, no ST changes.  The anterior lateral ST elevation has resolved.   Medications:    Infusions: . sodium chloride 100 mL/hr at 01/26/15 0700  . fentaNYL infusion INTRAVENOUS 150 mcg/hr (01/26/15 0930)  . midazolam (VERSED) infusion Stopped (01/26/15 0730)    Scheduled Medications: . antiseptic oral rinse  7 mL Mouth Rinse 10 times per day  . aspirin  81 mg Per Tube Daily  . atorvastatin  80 mg Oral q1800  . carvedilol  6.25 mg Oral BID WC  . cefTRIAXone (ROCEPHIN)  IV  1 g Intravenous Q24H  . chlorhexidine gluconate  15 mL Mouth Rinse BID  . enoxaparin (LOVENOX) injection  40 mg Subcutaneous Q24H  . feeding supplement (PRO-STAT SUGAR  FREE 64)  60 mL Per Tube TID  . feeding supplement (VITAL HIGH PROTEIN)  1,000 mL Per Tube Q24H  . insulin aspart  0-15 Units Subcutaneous 6 times per day  . insulin glargine  30 Units Subcutaneous Q24H  . pantoprazole sodium  40 mg Per Tube Daily  . sodium chloride  3 mL Intravenous Q12H  . ticagrelor  90 mg Per Tube BID    Assessment/ Plan:   Active Problems:   Cardiac arrest (HCC)   Acute MI, inferolateral wall, initial episode of care (HCC)   Respiratory failure (HCC)   Anoxic encephalopathy (HCC)   Acute respiratory failure with hypoxia (HCC)   Acute ST elevation myocardial infarction (STEMI) (HCC)  1.  CAD - s/p thrombectomy and stenting of the RCA No significant lesion on the left side.  Continue DAPT for 1 year Will increase Coreg to 12.5  BID  HR continues to be elevated.   2. Cardiac arrest.  Further management per PCCM.   still no purposeful movement  Further management per PCCM   3. Hyperlipidemia:  Continue atorvastatin   4. DM :  Continue insulin therapy .    Disposition:  Length of Stay: 4  Vesta MixerPhilip J. Nahser, Montez HagemanJr., MD, [facility] 01/26/2015, 10:03 AM Office [phone] Pager [phone]

## 2015-01-27 ENCOUNTER — Inpatient Hospital Stay (HOSPITAL_COMMUNITY): Payer: Managed Care, Other (non HMO)

## 2015-01-27 DIAGNOSIS — I469 Cardiac arrest, cause unspecified: Secondary | ICD-10-CM

## 2015-01-27 LAB — BLOOD GAS, ARTERIAL
Acid-Base Excess: 4.2 mmol/L — ABNORMAL HIGH (ref 0.0–2.0)
Bicarbonate: 27.1 mEq/L — ABNORMAL HIGH (ref 20.0–24.0)
DRAWN BY: 252031
FIO2: 0.4
O2 Saturation: 93.9 %
PATIENT TEMPERATURE: 101.3
PCO2 ART: 35.5 mmHg (ref 35.0–45.0)
PEEP/CPAP: 5 cmH2O
PO2 ART: 68.6 mmHg — AB (ref 80.0–100.0)
Pressure control: 15 cmH2O
RATE: 12 resp/min
TCO2: 28.1 mmol/L (ref 0–100)
pH, Arterial: 7.501 — ABNORMAL HIGH (ref 7.350–7.450)

## 2015-01-27 LAB — GLUCOSE, CAPILLARY
GLUCOSE-CAPILLARY: 249 mg/dL — AB (ref 65–99)
GLUCOSE-CAPILLARY: 241 mg/dL — AB (ref 65–99)
GLUCOSE-CAPILLARY: 259 mg/dL — AB (ref 65–99)
GLUCOSE-CAPILLARY: 223 mg/dL — AB (ref 65–99)
Glucose-Capillary: 258 mg/dL — ABNORMAL HIGH (ref 65–99)
Glucose-Capillary: 231 mg/dL — ABNORMAL HIGH (ref 65–99)

## 2015-01-27 LAB — CBC
HCT: 38.3 % — ABNORMAL LOW (ref 39.0–52.0)
Hemoglobin: 12.6 g/dL — ABNORMAL LOW (ref 13.0–17.0)
MCH: 29.2 pg (ref 26.0–34.0)
MCHC: 32.9 g/dL (ref 30.0–36.0)
MCV: 88.7 fL (ref 78.0–100.0)
PLATELETS: 269 10*3/uL (ref 150–400)
RBC: 4.32 MIL/uL (ref 4.22–5.81)
RDW: 14.3 % (ref 11.5–15.5)
WBC: 9.5 10*3/uL (ref 4.0–10.5)

## 2015-01-27 LAB — MAGNESIUM: MAGNESIUM: 1.7 mg/dL (ref 1.7–2.4)

## 2015-01-27 LAB — BASIC METABOLIC PANEL
Anion gap: 9 (ref 5–15)
BUN: 27 mg/dL — AB (ref 6–20)
CALCIUM: 9.4 mg/dL (ref 8.9–10.3)
CO2: 31 mmol/L (ref 22–32)
CREATININE: 0.9 mg/dL (ref 0.61–1.24)
Chloride: 110 mmol/L (ref 101–111)
Glucose, Bld: 263 mg/dL — ABNORMAL HIGH (ref 65–99)
Potassium: 3 mmol/L — ABNORMAL LOW (ref 3.5–5.1)
SODIUM: 150 mmol/L — AB (ref 135–145)

## 2015-01-27 LAB — PHOSPHORUS: Phosphorus: 1.1 mg/dL — ABNORMAL LOW (ref 2.5–4.6)

## 2015-01-27 LAB — AMMONIA: Ammonia: 14 umol/L (ref 9–35)

## 2015-01-27 MED ORDER — POTASSIUM PHOSPHATES 15 MMOLE/5ML IV SOLN
40.0000 meq | Freq: Once | INTRAVENOUS | Status: AC
  Administered 2015-01-27: 40 meq via INTRAVENOUS
  Filled 2015-01-27: qty 9.09

## 2015-01-27 MED ORDER — FREE WATER
300.0000 mL | Freq: Four times a day (QID) | Status: DC
  Administered 2015-01-27 – 2015-01-28 (×5): 300 mL

## 2015-01-27 MED ORDER — MAGNESIUM SULFATE 2 GM/50ML IV SOLN
2.0000 g | Freq: Once | INTRAVENOUS | Status: AC
  Administered 2015-01-27: 2 g via INTRAVENOUS
  Filled 2015-01-27: qty 50

## 2015-01-27 MED ORDER — FREE WATER
200.0000 mL | Freq: Three times a day (TID) | Status: DC
  Administered 2015-01-27: 200 mL

## 2015-01-27 NOTE — Consult Note (Signed)
NEURO HOSPITALIST CONSULT NOTE   Referring physician: Vassie Loll   Reason for Consult: AMS after cardiac arrest/rewarmed  HPI:                                                                                                                                          Albert Jensen is an 44 y.o. male with hx HTN, DM presented 10/15 after witnessed arrest. Initial rhythm PEA on EMS arrival with approx 25 mins CPR before ROSC (15 mins bystander and 10 mins with EMS). EKG concerning for STEMI, after negative head CT he was taken urgently to cath lab. Cath showed no significant disease. Patient was placed on cooling protocol. Rewarmed 10-16 at 1600 hours. Post rewarming has had a temperature of 101.1- 102.6 with current temp 100.5. Sedating medications have been D/c'd for over 10 hours. Per family patient may have opened eyes to command and lifted arm to command however nursing staff did not see this. IT is unclear if he was following commands at any point after being off sedation and rewarmed. Currently he is intubated, breathing over vent but follows no commands. CT head today shows no acute etiology for AMS.  EEG was obtained but at that time he was under sedation and cooling protocol--this showed no epileptiform activity. He currently is hypernatremic at 150 and hypokalemic 3.0 with temp of 100.5.   Past Medical History  Diagnosis Date  . Hypertension   . Diabetes mellitus     Past Surgical History  Procedure Laterality Date  . Cardiac catheterization N/A 01/22/2015    Procedure: Left Heart Cath and Coronary Angiography;  Surgeon: Corky Crafts, MD;  Location: [facility] INVASIVE CV LAB;  Service: Cardiovascular;  Laterality: N/A;  . Cardiac catheterization N/A 01/22/2015    Procedure: Coronary Stent Intervention;  Surgeon: Corky Crafts, MD;  Location: [facility] INVASIVE CV LAB;  Service: Cardiovascular;  Laterality: N/A;  prox rca    Family History  Problem Relation Age of Onset   . Heart disease Mother 21    MI  . Diabetes Father      Social History:  reports that he quit smoking about 9 years ago. He does not have any smokeless tobacco history on file. He reports that he drinks about 0.6 oz of alcohol per week. His drug history is not on file.  No Known Allergies  MEDICATIONS:  Prior to Admission:  Prescriptions prior to admission  Medication Sig Dispense Refill Last Dose  . fenofibrate (TRICOR) 48 MG tablet Take 2 tablets (96 mg total) by mouth daily. (Patient not taking: Reported on 01/22/2015) 180 tablet 1 Not Taking at Unknown time  . fenofibrate micronized (ANTARA) 130 MG capsule Take 1 capsule (130 mg total) by mouth daily before breakfast. (Patient not taking: Reported on 01/22/2015) 90 capsule 1 Not Taking at Unknown time  . glipiZIDE (GLUCOTROL) 10 MG tablet Take 1 tablet (10 mg total) by mouth daily. (Patient not taking: Reported on 01/22/2015) 90 tablet 1 Not Taking at Unknown time  . lisinopril (PRINIVIL,ZESTRIL) 10 MG tablet Take 1 tablet (10 mg total) by mouth daily. (Patient not taking: Reported on 01/22/2015) 90 tablet 1 Not Taking at Unknown time  . lisinopril-hydrochlorothiazide (PRINZIDE,ZESTORETIC) 10-12.5 MG per tablet Take 1 tablet by mouth daily. (Patient not taking: Reported on 01/22/2015) 90 tablet 1 Not Taking at Unknown time  . metFORMIN (GLUCOPHAGE) 1000 MG tablet Take 1 tablet (1,000 mg total) by mouth 2 (two) times daily with a meal. (Patient not taking: Reported on 01/22/2015) 180 tablet 1 Not Taking at Unknown time   Scheduled: . antiseptic oral rinse  7 mL Mouth Rinse 10 times per day  . aspirin  81 mg Per Tube Daily  . atorvastatin  80 mg Oral q1800  . carvedilol  25 mg Oral BID WC  . cefTRIAXone (ROCEPHIN)  IV  1 g Intravenous Q24H  . chlorhexidine gluconate  15 mL Mouth Rinse BID  . enoxaparin (LOVENOX) injection   40 mg Subcutaneous Q24H  . feeding supplement (PRO-STAT SUGAR FREE 64)  60 mL Per Tube TID  . feeding supplement (VITAL HIGH PROTEIN)  1,000 mL Per Tube Q24H  . free water  300 mL Per Tube Q6H  . Influenza vac split quadrivalent PF  0.5 mL Intramuscular Tomorrow-1000  . insulin aspart  0-15 Units Subcutaneous 6 times per day  . insulin glargine  30 Units Subcutaneous Q24H  . magnesium sulfate 1 - 4 g bolus IVPB  2 g Intravenous Once  . pantoprazole sodium  40 mg Per Tube Daily  . pneumococcal 23 valent vaccine  0.5 mL Intramuscular Tomorrow-1000  . potassium phosphate IVPB (mEq)  40 mEq Intravenous Once  . sodium chloride  3 mL Intravenous Q12H  . ticagrelor  90 mg Per Tube BID     ROS:                                                                                                                                       History obtained from unobtainable from patient due to mental status  General ROS: negative for - chills, fatigue, fever, night sweats, weight gain or weight loss Psychological ROS: negative for - behavioral disorder, hallucinations, memory difficulties, mood swings or suicidal ideation Ophthalmic ROS: negative for - blurry vision, double vision,  eye pain or loss of vision ENT ROS: negative for - epistaxis, nasal discharge, oral lesions, sore throat, tinnitus or vertigo Allergy and Immunology ROS: negative for - hives or itchy/watery eyes Hematological and Lymphatic ROS: negative for - bleeding problems, bruising or swollen lymph nodes Endocrine ROS: negative for - galactorrhea, hair pattern changes, polydipsia/polyuria or temperature intolerance Respiratory ROS: negative for - cough, hemoptysis, shortness of breath or wheezing Cardiovascular ROS: negative for - chest pain, dyspnea on exertion, edema or irregular heartbeat Gastrointestinal ROS: negative for - abdominal pain, diarrhea, hematemesis, nausea/vomiting or stool incontinence Genito-Urinary ROS: negative for -  dysuria, hematuria, incontinence or urinary frequency/urgency Musculoskeletal ROS: negative for - joint swelling or muscular weakness Neurological ROS: as noted in HPI Dermatological ROS: negative for rash and skin lesion changes   Blood pressure 158/96, pulse 91, temperature 100.5 F (38.1 C), temperature source Core (Comment), resp. rate 26, height 6\' 2"  (1.88 m), weight 117.2 kg (258 lb 6.1 oz), SpO2 98 %.   Neurologic Examination:                                                                                                      HEENT-  Normocephalic, no lesions, without obvious abnormality.  Normal external eye and conjunctiva.  Normal TM's bilaterally.  Normal auditory canals and external ears. Normal external nose, mucus membranes and septum.  Normal pharynx. Cardiovascular- S1, S2 normal, pulses palpable throughout   Lungs- chest clear, no wheezing, rales, normal symmetric air entry Abdomen- normal findings: bowel sounds normal Extremities- no edema Lymph-no adenopathy palpable Musculoskeletal-no joint tenderness, deformity or swelling Skin-warm and dry, no hyperpigmentation, vitiligo, or suspicious lesions  Neurological Examination Mental Status: Patient does not respond to verbal stimuli.  Does not respond to deep sternal rub but does show bilateral arm flexion in a decorticate manner.   Does not follow commands.  No verbalizations noted--intubated.  Cranial Nerves: II: patient does respond confrontation bilaterally, pupils right 3 mm, left 3 mm,and reactive bilaterally III,IV,VI: doll's response present bilaterally.  V,VII: corneal reflex present bilaterally  VIII: patient does not respond to verbal stimuli IX,X: gag reflex present, XI: trapezius strength unable to test bilaterally XII: tongue strength unable to test Motor: Extremities moving bilaterally arms>legs .  With sternal rub arms are flexed to center of chest bilaterally Sensory: To sever pain he will wince and  this was only noted when pain given to bilateral arms.  Deep Tendon Reflexes:  2+ throughout UE, KJ no AJ Plantars: absent bilaterally Cerebellar: Unable to perform        Lab Results: Basic Metabolic Panel:  Recent Labs Lab 01/23/15 0318 01/24/15 0430 01/25/15 0400 01/26/15 0448 01/27/15 0450  NA 138 141 141 141 150*  K 3.4* 4.1 3.9 3.5 3.0*  CL 106 107 112* 110 110  CO2 24 23 24 25 31   GLUCOSE 77 147* 240* 299* 263*  BUN 14 17 23* 27* 27*  CREATININE 0.87 1.02 0.92 0.81 0.90  CALCIUM 8.3* 8.5* 8.5* 8.3* 9.4  MG 2.0  --  2.0 1.9 1.7  PHOS 2.7  --  2.0* 1.7* 1.1*    Liver Function Tests: No results for input(s): AST, ALT, ALKPHOS, BILITOT, PROT, ALBUMIN in the last 168 hours. No results for input(s): LIPASE, AMYLASE in the last 168 hours. No results for input(s): AMMONIA in the last 168 hours.  CBC:  Recent Labs Lab 01/22/15 1128  01/23/15 0318 01/24/15 0430 01/25/15 0400 01/26/15 0448 01/27/15 0450  WBC 12.6*  --  11.9* 9.9 9.0 6.6 9.5  NEUTROABS 7.3  --   --   --   --   --   --   HGB 15.1  < > 15.3 13.4 11.9* 10.6* 12.6*  HCT 44.0  < > 44.6 40.0 35.9* 32.8* 38.3*  MCV 86.4  --  85.4 87.5 87.6 89.6 88.7  PLT 264  --  250 178 210 208 269  < > = values in this interval not displayed.  Cardiac Enzymes:  Recent Labs Lab 01/22/15 1430 01/22/15 2200 01/23/15 0318 01/23/15 0815  TROPONINI 2.66* 7.54* 10.30* 11.56*    Lipid Panel:  Recent Labs Lab 01/24/15 0430  CHOL 188  TRIG 474*  HDL 29*  CHOLHDL 6.5  VLDL UNABLE TO CALCULATE IF TRIGLYCERIDE OVER 400 mg/dL  LDLCALC UNABLE TO CALCULATE IF TRIGLYCERIDE OVER 400 mg/dL    CBG:  Recent Labs Lab 01/26/15 1149 01/26/15 1617 01/26/15 2312 01/27/15 0451 01/27/15 0739  GLUCAP 279* 261* 244* 249* 241*    Microbiology: Results for orders placed or performed during the hospital encounter of 01/22/15  MRSA PCR Screening     Status: None   Collection Time: 01/22/15  2:15 PM  Result Value  Ref Range Status   MRSA by PCR NEGATIVE NEGATIVE Final    Comment:        The GeneXpert MRSA Assay (FDA approved for NASAL specimens only), is one component of a comprehensive MRSA colonization surveillance program. It is not intended to diagnose MRSA infection nor to guide or monitor treatment for MRSA infections.   Culture, Urine     Status: None   Collection Time: 01/23/15  8:11 PM  Result Value Ref Range Status   Specimen Description URINE, CATHETERIZED  Final   Special Requests NONE  Final   Culture NO GROWTH 1 DAY  Final   Report Status 01/24/2015 FINAL  Final    Coagulation Studies: No results for input(s): LABPROT, INR in the last 72 hours.  Imaging: Ct Head Wo Contrast  01/27/2015  CLINICAL DATA:  44 year old male came in to ED with cardiac arrest 01/22/15, evaluate for encephalopathy per Dr. Molli Knock EXAM: CT HEAD WITHOUT CONTRAST TECHNIQUE: Contiguous axial images were obtained from the base of the skull through the vertex without intravenous contrast. COMPARISON:  01/22/2015 FINDINGS: There is no intra or extra-axial fluid collection or mass lesion. The basilar cisterns and ventricles have a normal appearance. There is no CT evidence for acute infarction or hemorrhage. IMPRESSION: Negative exam. Electronically Signed   By: Norva Pavlov M.D.   On: 01/27/2015 09:24   Dg Chest Port 1 View  01/27/2015  CLINICAL DATA:  Shortness of breath. EXAM: PORTABLE CHEST 1 VIEW COMPARISON:  01/26/2015 . FINDINGS: Endotracheal tube, NG tube, left IJ line in stable position. Cardiomegaly with normal pulmonary vascularity. Low lung volumes with bibasilar atelectasis and/or infiltrates or persistent. No pleural effusion pneumothorax. IMPRESSION: 1. Lines and tubes in stable position. 2. Persistent unchanged low lung volumes with bibasilar atelectasis and/or infiltrates. 3. Stable cardiomegaly. No pulmonary venous congestion on today's exam . Electronically Signed  ByMaisie Fus  Register    On: 01/27/2015 07:20   Dg Chest Port 1 View  01/26/2015  CLINICAL DATA:  Intubation. EXAM: PORTABLE CHEST 1 VIEW COMPARISON:  01/25/2015. FINDINGS: Endotracheal tube and NG tube in good anatomic position. Cardiomegaly with mild pulmonary venous congestion. Persistent but improving bilateral basilar atelectasis and/or infiltrates. No pleural effusion or pneumothorax. IMPRESSION: 1. Lines and tubes stable position. 2. Cardiomegaly with mild pulmonary venous congestion. 3. Low lung volumes with interim partial clearing of bibasilar atelectasis and/or infiltrates. Electronically Signed   By: Maisie Fus  Register   On: 01/26/2015 07:15       Assessment and plan per attending neurologist  Felicie Morn PA-C [facility] [phone]  01/27/2015, 9:41 AM   Assessment/Plan: 44 YO male who suffered cardiac arrest with total down time of 25 minutes.  Cardiac cath showed no occlusion and patient placed under cooling protocol. Currently 4 days out and not showing any response to stimuli other than flexion of bilateral extremities and blink to threat.  Exam is notable for intact corneal reflex, dolls reflex, blink to threat, pupillary reflex, breathing over the vent. Patient is hypophosphatemic, hypokalemic, hypernatremic, with negative urine culture.  With above labs patient could very well could be metabolic encephalopathy.  That said, given cardiac arrest cannot rule out diffuse hypoxic encephalopathy is a likely contributor, as well.   Recommend: 1) repeat EEG 2) MRI brain 3) Correct metabolic issues.   We will continue to follow this patient with you.  This patient is critically ill and at significant risk of neurological worsening or death, and care requires constant monitoring of vital signs, hemodynamics,respiratory and cardiac monitoring, neurological assessment, discussion with family, other specialists and medical decision making of high complexity. Total critical care time was 55  minutes.  I personally participated in this patient's evaluation and management, including clinical examination, as well as formulating the above clinical impression and management recommendations.  Venetia Maxon M.D. [facility] [phone]

## 2015-01-27 NOTE — Procedures (Signed)
EEG report.  Brief clinical history: 44 YO male who suffered cardiac arrest with total down time of 25 minutes. Cardiac cath showed no occlusion and patient placed under cooling protocol. Currently 4 days out and not showing any response to stimuli other than flexion of bilateral extremities and blink to threat. Exam is notable for intact corneal reflex, dolls reflex, blink to threat, pupillary reflex, breathing over the vent. Patient is hypophosphatemic, hypokalemic, hypernatremic, with negative urine culture.    Technique: this is a 17 channel routine scalp EEG performed at the bedside with bipolar and monopolar montages arranged in accordance to the international 10/20 system of electrode placement. One channel was dedicated to EKG recording.  The patient is intubated on the vent but off sedatives. No activating procedures performed.  Description: the study demonstrated generalized continuous, monomorphic, non reactive, and predominantly high amplitude delta slowing that doesn't follow an evolving pattern at any time. No focal or generalized epileptiform discharges noted.  EKG showed sinus rhythm.  Impression: this is an abnormal EEG with findings consistent with a severe encephalopathy, non specific as to cause. No electrographic seizures noted. Clinical correlation is advised.   Wyatt Portelasvaldo Camilo, MD [facility]

## 2015-01-27 NOTE — Progress Notes (Signed)
EEG completed, results pending. 

## 2015-01-27 NOTE — Progress Notes (Signed)
eLink Physician-Brief Progress Note Patient Name: Albert MarylandRicky Jensen DOB: 10/05/1970 MRN: [id number]   Date of Service  01/27/2015  HPI/Events of Note    eICU Interventions  K Phos ordered     Intervention Category Minor Interventions: Electrolytes abnormality - evaluation and management  Kroy Sprung S. 01/27/2015, 6:19 AM

## 2015-01-27 NOTE — Progress Notes (Signed)
PROGRESS NOTE  Subjective:   44 yo man, no previos hx of CAD Admitted 10/15 with cardiac arrest.  Cath showed no significant disease in the LM, LAD, Lcx. RCA had a thrombotic lesion .   Had thrombectomy and stenting .  Had the [place] sun cooling protocol.  Now has rewarmed.  Off sedation. Still no purposeful movement    Echo  is basically normal  - mild diastolic dysfunction. Normal EF -65%  Going for head CT today   Objective:    Vital Signs:   Temp:  [99.1 F (37.3 C)-102.6 F (39.2 C)] 100.5 F (38.1 C) (10/20 0700) Pulse Rate:  [64-121] 104 (10/20 0700) Resp:  [18-34] 22 (10/20 0700) BP: (132-209)/(52-110) 141/72 mmHg (10/20 0700) SpO2:  [91 %-98 %] 97 % (10/20 0700) FiO2 (%):  [30 %-50 %] 50 % (10/20 0355) Weight:  [117.2 kg (258 lb 6.1 oz)] 117.2 kg (258 lb 6.1 oz) (10/20 0500)  Last BM Date: 01/22/15   24-hour weight change: Weight change: -3.2 kg (-7 lb 0.9 oz)  Weight trends: Filed Weights   01/25/15 0200 01/26/15 0300 01/27/15 0500  Weight: 116.61 kg (257 lb 1.3 oz) 120.4 kg (265 lb 6.9 oz) 117.2 kg (258 lb 6.1 oz)    Intake/Output:  10/19 0701 - 10/20 0700 In: 4267.5 [I.V.:2752.5; ZO/XW:9604; IV Piggyback:50] Out: 9180 [Urine:9180]     Physical Exam: BP 141/72 mmHg  Pulse 104  Temp(Src) 100.5 F (38.1 C) (Core (Comment))  Resp 22  Ht  (1.88 m)  Wt 117.2 kg (258 lb 6.1 oz)  BMI 33.16 kg/m2  SpO2 97%  Wt Readings from Last 3 Encounters:  01/27/15 117.2 kg (258 lb 6.1 oz)  03/05/12 130.364 kg (287 lb 6.4 oz)  05/24/10 125.193 kg (276 lb)    General: Vital signs reviewed and noted. On the vent   Head: Abrasion on back of his head   Eyes: Not assessed.   Throat: ETT in place   Neck:  normal   Lungs:   on vent   Heart:  RR , tachy  Abdomen:  Soft, non-tender, non-distended    Extremities: No edema   Neurologic: Sedated   Psych: NA    Labs: BMET:  Recent Labs  01/26/15 0448 01/27/15 0450  NA 141 150*  K 3.5 3.0*    CL 110 110  CO2 25 31  GLUCOSE 299* 263*  BUN 27* 27*  CREATININE 0.81 0.90  CALCIUM 8.3* 9.4  MG 1.9 1.7  PHOS 1.7* 1.1*    Liver function tests: No results for input(s): AST, ALT, ALKPHOS, BILITOT, PROT, ALBUMIN in the last 72 hours. No results for input(s): LIPASE, AMYLASE in the last 72 hours.  CBC:  Recent Labs  01/26/15 0448 01/27/15 0450  WBC 6.6 9.5  HGB 10.6* 12.6*  HCT 32.8* 38.3*  MCV 89.6 88.7  PLT 208 269    Cardiac Enzymes: No results for input(s): CKTOTAL, CKMB, TROPONINI in the last 72 hours.  Coagulation Studies: No results for input(s): LABPROT, INR in the last 72 hours.  Other: Invalid input(s): POCBNP No results for input(s): DDIMER in the last 72 hours. No results for input(s): HGBA1C in the last 72 hours. No results for input(s): CHOL, HDL, LDLCALC, TRIG, CHOLHDL in the last 72 hours. No results for input(s): TSH, T4TOTAL, T3FREE, THYROIDAB in the last 72 hours.  Invalid input(s): FREET3 No results for input(s): VITAMINB12, FOLATE, FERRITIN, TIBC, IRON, RETICCTPCT in the last 72 hours.  Other results:  Tele  ( personally reviewed )  - sinus tach , occasional PVCs  Medications:    Infusions: . sodium chloride 100 mL/hr at 01/26/15 1702  . fentaNYL infusion INTRAVENOUS Stopped (01/26/15 2300)  . midazolam (VERSED) infusion Stopped (01/26/15 0730)    Scheduled Medications: . antiseptic oral rinse  7 mL Mouth Rinse 10 times per day  . aspirin  81 mg Per Tube Daily  . atorvastatin  80 mg Oral q1800  . carvedilol  25 mg Oral BID WC  . cefTRIAXone (ROCEPHIN)  IV  1 g Intravenous Q24H  . chlorhexidine gluconate  15 mL Mouth Rinse BID  . enoxaparin (LOVENOX) injection  40 mg Subcutaneous Q24H  . feeding supplement (PRO-STAT SUGAR FREE 64)  60 mL Per Tube TID  . feeding supplement (VITAL HIGH PROTEIN)  1,000 mL Per Tube Q24H  . free water  200 mL Per Tube 3 times per day  . Influenza vac split quadrivalent PF  0.5 mL Intramuscular  Tomorrow-1000  . insulin aspart  0-15 Units Subcutaneous 6 times per day  . insulin glargine  30 Units Subcutaneous Q24H  . pantoprazole sodium  40 mg Per Tube Daily  . pneumococcal 23 valent vaccine  0.5 mL Intramuscular Tomorrow-1000  . potassium phosphate IVPB (mEq)  40 mEq Intravenous Once  . sodium chloride  3 mL Intravenous Q12H  . ticagrelor  90 mg Per Tube BID    Assessment/ Plan:   Active Problems:   Cardiac arrest (HCC)   Acute MI, inferolateral wall, initial episode of care (HCC)   Respiratory failure (HCC)   Anoxic encephalopathy (HCC)   Acute respiratory failure with hypoxia (HCC)   Acute ST elevation myocardial infarction (STEMI) (HCC)  1.  CAD - s/p thrombectomy and stenting of the RCA No significant lesion on the left side.  Continue DAPT for 1 year Coreg was increased  to 25  BID last night  HR continues to be elevated.  Can consider increasing up to 50 BID tomorrow if he remains tachycardic   2. Cardiac arrest.  Further management per PCCM.   still no purposeful movement  Further management per PCCM   3. Hyperlipidemia:  Continue atorvastatin   4. DM :  Continue insulin therapy .    Disposition:  Length of Stay: 5  Vesta MixerPhilip J. Princesa Willig, Montez HagemanJr., MD, [facility] 01/27/2015, 8:11 AM Office [phone] Pager [phone]

## 2015-01-27 NOTE — Progress Notes (Addendum)
Patient's brother called for update. Advised that there was no change and patient was moving his extremities but no purposeful movement or obeying commands and that all sedation had been off for several hours. Brother called back with further questions. He was concerned about pain meds being off, he thought they would be left on until extubation d/t broken ribs. I advised him it was turned off per the MD to assess his neurological function but that if needed overnight we would turn it back on. The brother also questioned patients neuro status - he thought he had been obeying commands. I advised him that he had not obeyed any commands for me either this night or last night and he had not on dayshift either. Per previous reports he had not obeyed any commands since his admission.

## 2015-01-27 NOTE — Progress Notes (Signed)
PULMONARY / CRITICAL CARE MEDICINE   Name: Albert Jensen MRN: [id number] DOB: 1971-01-02    ADMISSION DATE:  01/22/2015 CONSULTATION DATE:  10/15  REFERRING MD :  Isabel Caprice   CHIEF COMPLAINT:  Cardiac arrest   INITIAL PRESENTATION: 44yo male with hx HTN, DM presented 10/15 after witnessed arrest.  Initial rhythm PEA on EMS arrival with approx 25 mins CPR before ROSC (15 mins bystander and 10 mins with EMS). EKG concerning for STEMI, after negative head CT he was taken urgently to cath lab and PCCM consulted for vent management/hypothermia protocol.   STUDIES:  CT head/c-spine 10/15>>>neg acute  SIGNIFICANT EVENTS: 10/15>cath lab  SUBJECTIVE:  Not following commands this AM despite 3 hours off sedation, no further events overnight.  VITAL SIGNS: Temp:  [99.1 F (37.3 C)-102.6 F (39.2 C)] 100.5 F (38.1 C) (10/20 0700) Pulse Rate:  [64-121] 104 (10/20 0700) Resp:  [18-34] 22 (10/20 0700) BP: (132-209)/(52-110) 141/72 mmHg (10/20 0700) SpO2:  [91 %-98 %] 97 % (10/20 0700) FiO2 (%):  [30 %-50 %] 50 % (10/20 0355) Weight:  [117.2 kg (258 lb 6.1 oz)] 117.2 kg (258 lb 6.1 oz) (10/20 0500)  HEMODYNAMICS:    VENTILATOR SETTINGS: Vent Mode:  [-] PCV FiO2 (%):  [30 %-50 %] 50 % Set Rate:  [12 bmp] 12 bmp PEEP:  [5 cmH20] 5 cmH20 Plateau Pressure:  [18 cmH20-21 cmH20] 18 cmH20 INTAKE / OUTPUT:  Intake/Output Summary (Last 24 hours) at 01/27/15 3474 Last data filed at 01/27/15 0700  Gross per 24 hour  Intake   4056 ml  Output   9005 ml  Net  -4949 ml   PHYSICAL EXAMINATION: General:  wdwn male, appears older than stated age Neuro:  Agitated intermittently and purposeful pulling at ETT, moves all 4 ext to pain but not command this AM  HEENT:  PERRL, mm moist, ETT< C-collar  Cardiovascular:  RRR, Nl S1/S2, -M/R/G. Lungs:  Resps even non labored on vent, few scattered rhonchi since change to PCV Abdomen:  Round, soft, hypoactive bs  Musculoskeletal:  Warm and dry, multiple  large areas plaque psoriasis   LABS:  CBC  Recent Labs Lab 01/25/15 0400 01/26/15 0448 01/27/15 0450  WBC 9.0 6.6 9.5  HGB 11.9* 10.6* 12.6*  HCT 35.9* 32.8* 38.3*  PLT 210 208 269   Coag's  Recent Labs Lab 01/22/15 1128 01/22/15 1430 01/22/15 2200  APTT  --  87*  --   INR 1.09 3.06* 1.08   BMET  Recent Labs Lab 01/25/15 0400 01/26/15 0448 01/27/15 0450  NA 141 141 150*  K 3.9 3.5 3.0*  CL 112* 110 110  CO2 BUN 23* 27* 27*  CREATININE 0.92 0.81 0.90  GLUCOSE 240* 299* 263*   Electrolytes  Recent Labs Lab 01/25/15 0400 01/26/15 0448 01/27/15 0450  CALCIUM 8.5* 8.3* 9.4  MG 2.0 1.9 1.7  PHOS 2.0* 1.7* 1.1*   Sepsis Markers No results for input(s): LATICACIDVEN, PROCALCITON, O2SATVEN in the last 168 hours. ABG  Recent Labs Lab 01/25/15 1315 01/26/15 0340 01/27/15 0258  PHART 7.361 7.362 7.501*  PCO2ART 39.7 43.4 35.5  PO2ART 68.8* 72.4* 68.6*   Liver Enzymes No results for input(s): AST, ALT, ALKPHOS, BILITOT, ALBUMIN in the last 168 hours. Cardiac Enzymes  Recent Labs Lab 01/22/15 2200 01/23/15 0318 01/23/15 0815  TROPONINI 7.54* 10.30* 11.56*   Glucose  Recent Labs Lab 01/26/15 0747 01/26/15 1149 01/26/15 1617 01/26/15 2312 01/27/15 0451 01/27/15 0739  GLUCAP 255* 279* 261*  244* 249* 241*   Imaging Dg Chest Port 1 View  01/27/2015  CLINICAL DATA:  Shortness of breath. EXAM: PORTABLE CHEST 1 VIEW COMPARISON:  01/26/2015 . FINDINGS: Endotracheal tube, NG tube, left IJ line in stable position. Cardiomegaly with normal pulmonary vascularity. Low lung volumes with bibasilar atelectasis and/or infiltrates or persistent. No pleural effusion pneumothorax. IMPRESSION: 1. Lines and tubes in stable position. 2. Persistent unchanged low lung volumes with bibasilar atelectasis and/or infiltrates. 3. Stable cardiomegaly. No pulmonary venous congestion on today's exam . Electronically Signed   By: Maisie Fushomas  Register   On: 01/27/2015  07:20   ASSESSMENT / PLAN:  PULMONARY OETT 10/15>>> Acute respiratory failure - post cardiac arrest  Patient has multiple ribs dislocated from his sternum and every time he is placed on a wean internal rebreathing is noted. P:   Changed to PCV for asynchrony concerns, PEEP to 5 and FiO2 to 40% so will begin PS trials. F/u CXR. F/u ABG. Adjust vent for ABG.  CARDIOVASCULAR CVL L IJ 10/16>>> Cardiac arrest - PEA  STEMI  RCA occlusion - s/p stenting 10/15 P:  Anticoagulation per cards  2D echo not ordered (delayed until patient is able to comply with tech to get a good study) but EF is 55-65% on cath. ASA. Normothermia protocol complete. Hold home lisinopril, tricor, HCTZ Increased Coreg to 25 BID  RENAL Hypo K, Mg and Phos. P:   F/u chem. KVO IVF. Replace electrolytes as indicated. Hold further lasix. Free water.  GASTROINTESTINAL No active issue  P:   PPI. Nutrition for TF.  HEMATOLOGIC No active issue  P:  Anticoagulation per cards. F/u CBC. SQ heparin.  INFECTIOUS UTI  P:   Urine culture 10/16>>>  Rocephin 10/16>>>  ENDOCRINE Hx DM  P:   Off insulin drip. Resistant scale and lantus 30. Hold home metformin.  NEUROLOGIC AMS - post cardiac arrest  Fall - head CT neg  Shivering  Patient was following commands on 10/18, 10/19 was very much sedated and not following commands, no 10/20 patient is off sedation and is not arousable or following command. P:   RASS goal: 0 to -2 Fentanyl drip D/C versed Normothermia protocol - no paralytic gtt  Reorder EEG and CT. Neurology consult called to Dr. Roseanne RenoStewart. Will need to clear c-spine once awake.  FAMILY  - Updates: No family bedside, will need to discuss changes with them upon arrival.   The patient is critically ill with multiple organ systems failure and requires high complexity decision making for assessment and support, frequent evaluation and titration of therapies, application of advanced  monitoring technologies and extensive interpretation of multiple databases.   Critical Care Time devoted to patient care services described in this note is  35  Minutes. This time reflects time of care of this signee Dr Koren BoundWesam Yacoub. This critical care time does not reflect procedure time, or teaching time or supervisory time of PA/NP/Med student/Med Resident etc but could involve care discussion time.  Alyson ReedyWesam G. Yacoub, M.D. [facility]/Critical [facility]. Pager: [phone]. After hours pager: [phone].  01/27/2015  8:32 AM

## 2015-01-27 NOTE — Progress Notes (Signed)
Inpatient Diabetes Program Recommendations  AACE/ADA: New Consensus Statement on Inpatient Glycemic Control (2015)  Target Ranges:  Prepandial:   less than 140 mg/dL      Peak postprandial:   less than 180 mg/dL (1-2 hours)      Critically ill patients:  140 - 180 mg/dL   Review of Glycemic Control  Diabetes history: DM 2 Outpatient Diabetes medications: reported not taking meds at home Current orders for Inpatient glycemic control: Lantus 30 units, Novolog Moderate Q4  Inpatient Diabetes Program Recommendations:   Insulin - Tube Fee Coverage: Patient on Vital HP tube feeds. Glucose in the mid 200's consistently. Please consider adding Novolog 3-5 units Q4 Tube Feed coverage in addition to correction.  Thanks,  Callahan Peddie RN, MSN, PCCN Inpatient Diabetes Coordinator Team Pager [phone] (8a-5p)  

## 2015-01-28 ENCOUNTER — Inpatient Hospital Stay (HOSPITAL_COMMUNITY): Payer: Managed Care, Other (non HMO)

## 2015-01-28 LAB — BLOOD GAS, ARTERIAL
ACID-BASE EXCESS: 5.2 mmol/L — AB (ref 0.0–2.0)
BICARBONATE: 28.7 meq/L — AB (ref 20.0–24.0)
Drawn by: 244861
FIO2: 0.4
LHR: 12 {breaths}/min
O2 SAT: 96.9 %
PATIENT TEMPERATURE: 100.6
PCO2 ART: 40.5 mmHg (ref 35.0–45.0)
PEEP: 5 cmH2O
PH ART: 7.469 — AB (ref 7.350–7.450)
PRESSURE CONTROL: 15 cmH2O
TCO2: 29.9 mmol/L (ref 0–100)
pO2, Arterial: 92.4 mmHg (ref 80.0–100.0)

## 2015-01-28 LAB — CBC
HCT: 37.2 % — ABNORMAL LOW (ref 39.0–52.0)
Hemoglobin: 12 g/dL — ABNORMAL LOW (ref 13.0–17.0)
MCH: 28.9 pg (ref 26.0–34.0)
MCHC: 32.3 g/dL (ref 30.0–36.0)
MCV: 89.6 fL (ref 78.0–100.0)
PLATELETS: 260 10*3/uL (ref 150–400)
RBC: 4.15 MIL/uL — ABNORMAL LOW (ref 4.22–5.81)
RDW: 14.3 % (ref 11.5–15.5)
WBC: 8.6 10*3/uL (ref 4.0–10.5)

## 2015-01-28 LAB — GLUCOSE, CAPILLARY
GLUCOSE-CAPILLARY: 227 mg/dL — AB (ref 65–99)
GLUCOSE-CAPILLARY: 247 mg/dL — AB (ref 65–99)
GLUCOSE-CAPILLARY: 315 mg/dL — AB (ref 65–99)
Glucose-Capillary: 257 mg/dL — ABNORMAL HIGH (ref 65–99)
Glucose-Capillary: 228 mg/dL — ABNORMAL HIGH (ref 65–99)
Glucose-Capillary: 210 mg/dL — ABNORMAL HIGH (ref 65–99)

## 2015-01-28 LAB — BASIC METABOLIC PANEL
Anion gap: 10 (ref 5–15)
BUN: 28 mg/dL — AB (ref 6–20)
CALCIUM: 8.9 mg/dL (ref 8.9–10.3)
CO2: 31 mmol/L (ref 22–32)
Chloride: 103 mmol/L (ref 101–111)
Creatinine, Ser: 0.84 mg/dL (ref 0.61–1.24)
GFR calc Af Amer: >60 mL/min (ref >60)
GLUCOSE: 253 mg/dL — AB (ref 65–99)
POTASSIUM: 3.3 mmol/L — AB (ref 3.5–5.1)
Sodium: 144 mmol/L (ref 135–145)

## 2015-01-28 LAB — MAGNESIUM: Magnesium: 2.1 mg/dL (ref 1.7–2.4)

## 2015-01-28 LAB — PHOSPHORUS: Phosphorus: 3.3 mg/dL (ref 2.5–4.6)

## 2015-01-28 MED ORDER — FUROSEMIDE 10 MG/ML IJ SOLN: 40.0000 mg | Freq: Three times a day (TID) | INTRAMUSCULAR | Status: DC

## 2015-01-28 MED ORDER — POTASSIUM CHLORIDE 20 MEQ/15ML (10%) PO SOLN
20.0000 meq | ORAL | Status: AC
  Administered 2015-01-28 (×2): 20 meq
  Filled 2015-01-28 (×2): qty 15

## 2015-01-28 MED ORDER — POTASSIUM CHLORIDE 10 MEQ/50ML IV SOLN
10.0000 meq | INTRAVENOUS | Status: AC
  Administered 2015-01-28 (×4): 10 meq via INTRAVENOUS
  Filled 2015-01-28 (×4): qty 50

## 2015-01-28 MED ORDER — ALTEPLASE 2 MG IJ SOLR
2.0000 mg | Freq: Once | INTRAMUSCULAR | Status: AC
  Administered 2015-01-28: 2 mg
  Filled 2015-01-28: qty 2

## 2015-01-28 MED ORDER — PNEUMOCOCCAL VAC POLYVALENT 25 MCG/0.5ML IJ INJ
0.5000 mL | INJECTION | INTRAMUSCULAR | Status: DC | PRN
Start: 2015-01-28 — End: 2015-02-08

## 2015-01-28 MED ORDER — FUROSEMIDE 10 MG/ML IJ SOLN
40.0000 mg | Freq: Two times a day (BID) | INTRAMUSCULAR | Status: DC
  Administered 2015-01-28 – 2015-01-31 (×6): 40 mg via INTRAVENOUS
  Filled 2015-01-28 (×6): qty 4

## 2015-01-28 MED ORDER — FREE WATER
250.0000 mL | Freq: Four times a day (QID) | Status: DC
  Administered 2015-01-28 – 2015-01-31 (×12): 250 mL

## 2015-01-28 MED ORDER — CHLORHEXIDINE GLUCONATE 0.12 % MT SOLN
OROMUCOSAL | Status: AC
  Filled 2015-01-28: qty 15

## 2015-01-28 MED ORDER — INFLUENZA VAC SPLIT QUAD 0.5 ML IM SUSY: 0.5000 mL | PREFILLED_SYRINGE | INTRAMUSCULAR | Status: DC | PRN

## 2015-01-28 MED ORDER — POTASSIUM CHLORIDE 20 MEQ/15ML (10%) PO SOLN
40.0000 meq | Freq: Three times a day (TID) | ORAL | Status: AC
  Administered 2015-01-28 (×2): 40 meq
  Filled 2015-01-28 (×2): qty 30

## 2015-01-28 NOTE — Progress Notes (Signed)
Subjective: Patient remains intubated and on mechanical ventilation. He is arousable and can intermittently follow simple verbal commands with extremity movements.  Objective: Current vital signs: BP 164/88 mmHg  Pulse 88  Temp(Src) 100 F (37.8 C) (Core (Comment))  Resp 25  Ht 6\' 2"  (1.88 m)  Wt 117.4 kg (258 lb 13.1 oz)  BMI 33.22 kg/m2  SpO2 98%  Neurologic Exam: Patient had spontaneous respirations. He was arousable with verbal stimulation and able to move extremities voluntarily to command, left greater than right. Pupils were equal and reacted normally to light. Eyes were midline and deviated only minimally to the right or left side. Face was symmetrical with no focal weakness. Muscle tone was flaccid throughout. Withdrawal movements to noxious stimuli were equal.  MRI of the brain showed subtle diffusion abnormality involving right and left hippocampal and medial temporal lobes as well as right caudate nucleus. These findings are concerning for acute hypoxic injury.  EEG on 01/27/2015 showed findings consistent with severe diffuse nonspecific encephalopathy. No evidence of epileptic activity was seen.  Medications: I have reviewed the patient's current medications.  Assessment/Plan: 44 year old man status post cardiac arrest with moderately severe diffuse hypoxic encephalopathy. Patient is slightly more responsive today than on yesterday. It is likely that he will require prolonged rehabilitation efforts, with prognosis to being able to return to independent functioning guarded at this point.  No further neurodiagnostic studies are indicated acutely. We will continue to follow this patient with you.  C.R. Roseanne RenoStewart, MD [facility] [phone]  01/28/2015  9:00 AM

## 2015-01-28 NOTE — Progress Notes (Signed)
I witness Latroya rn wasting versed and fentanyl.

## 2015-01-28 NOTE — Progress Notes (Signed)
PROGRESS NOTE  Subjective:   44 yo man, no previos hx of CAD Admitted 10/15 with cardiac arrest.  Cath showed no significant disease in the LM, LAD, Lcx. RCA had a thrombotic lesion .   Had thrombectomy and stenting .  Had the [place] sun cooling protocol.  Now has rewarmed.  Off sedation. Still no purposeful movement    Echo  is basically normal  - mild diastolic dysfunction. Normal EF -65%  Going for head CT today   Objective:    Vital Signs:   Temp:  [98.2 F (36.8 C)-100.6 F (38.1 C)] 100 F (37.8 C) (10/21 0400) Pulse Rate:  [34-107] 88 (10/21 0600) Resp:  [16-33] 25 (10/21 0600) BP: (118-183)/(57-103) 164/88 mmHg (10/21 0600) SpO2:  [94 %-100 %] 98 % (10/21 0600) FiO2 (%):  [40 %-50 %] 40 % (10/21 0308) Weight:  [258 lb 13.1 oz (117.4 kg)] 258 lb 13.1 oz (117.4 kg) (10/21 0500)  Last BM Date: 01/22/15   24-hour weight change: Weight change: 7.1 oz (0.2 kg)  Weight trends: Filed Weights   01/26/15 0300 01/27/15 0500 01/28/15 0500  Weight: 265 lb 6.9 oz (120.4 kg) 258 lb 6.1 oz (117.2 kg) 258 lb 13.1 oz (117.4 kg)    Intake/Output:  10/20 0701 - 10/21 0700 In: 2848 [I.V.:273; NG/GT:1550; IV Piggyback:1025] Out: 2000 [Urine:2000]     Physical Exam: BP 164/88 mmHg  Pulse 88  Temp(Src) 100 F (37.8 C) (Core (Comment))  Resp 25  Ht 6\' 2"  (1.88 m)  Wt 258 lb 13.1 oz (117.4 kg)  BMI 33.22 kg/m2  SpO2 98%  Wt Readings from Last 3 Encounters:  01/28/15 258 lb 13.1 oz (117.4 kg)  03/05/12 287 lb 6.4 oz (130.364 kg)  05/24/10 276 lb (125.193 kg)    General: Vital signs reviewed and noted. On the vent   Head: Abrasion on back of his head   Eyes: Not assessed.   Throat: ETT in place   Neck:  normal   Lungs:   on vent   Heart:  RR , tachy  Abdomen:  Soft, non-tender, non-distended    Extremities: No edema   Neurologic: Sedated   Psych: NA    Labs: BMET:  Recent Labs  01/27/15 0450 01/28/15 0355  NA 150* 144  K 3.0* 3.3*  CL 110  103  CO2 31 31  GLUCOSE 263* 253*  BUN 27* 28*  CREATININE 0.90 0.84  CALCIUM 9.4 8.9  MG 1.7 2.1  PHOS 1.1* 3.3    Liver function tests: No results for input(s): AST, ALT, ALKPHOS, BILITOT, PROT, ALBUMIN in the last 72 hours. No results for input(s): LIPASE, AMYLASE in the last 72 hours.  CBC:  Recent Labs  01/27/15 0450 01/28/15 0355  WBC 9.5 8.6  HGB 12.6* 12.0*  HCT 38.3* 37.2*  MCV 88.7 89.6  PLT 269 260    Cardiac Enzymes: No results for input(s): CKTOTAL, CKMB, TROPONINI in the last 72 hours.  Coagulation Studies: No results for input(s): LABPROT, INR in the last 72 hours.  Other: Invalid input(s): POCBNP No results for input(s): DDIMER in the last 72 hours. No results for input(s): HGBA1C in the last 72 hours. No results for input(s): CHOL, HDL, LDLCALC, TRIG, CHOLHDL in the last 72 hours. No results for input(s): TSH, T4TOTAL, T3FREE, THYROIDAB in the last 72 hours.  Invalid input(s): FREET3 No results for input(s): VITAMINB12, FOLATE, FERRITIN, TIBC, IRON, RETICCTPCT in the last 72 hours.   Other results:  Tele  ( personally reviewed )  - sinus tach , occasional PVCs  Medications:    Infusions: . sodium chloride Stopped (01/28/15 0100)  . fentaNYL infusion INTRAVENOUS Stopped (01/26/15 2300)  . midazolam (VERSED) infusion Stopped (01/26/15 0730)    Scheduled Medications: . antiseptic oral rinse  7 mL Mouth Rinse 10 times per day  . aspirin  81 mg Per Tube Daily  . atorvastatin  80 mg Oral q1800  . carvedilol  25 mg Oral BID WC  . chlorhexidine gluconate  15 mL Mouth Rinse BID  . enoxaparin (LOVENOX) injection  40 mg Subcutaneous Q24H  . feeding supplement (PRO-STAT SUGAR FREE 64)  60 mL Per Tube TID  . feeding supplement (VITAL HIGH PROTEIN)  1,000 mL Per Tube Q24H  . free water  300 mL Per Tube Q6H  . Influenza vac split quadrivalent PF  0.5 mL Intramuscular Tomorrow-1000  . insulin aspart  0-15 Units Subcutaneous 6 times per day  .  insulin glargine  30 Units Subcutaneous Q24H  . pantoprazole sodium  40 mg Per Tube Daily  . pneumococcal 23 valent vaccine  0.5 mL Intramuscular Tomorrow-1000  . potassium chloride  20 mEq Per Tube Q4H  . sodium chloride  3 mL Intravenous Q12H  . ticagrelor  90 mg Per Tube BID    Assessment/ Plan:   Active Problems:   Cardiac arrest (HCC)   Acute MI, inferolateral wall, initial episode of care (HCC)   Respiratory failure (HCC)   Anoxic encephalopathy (HCC)   Acute respiratory failure with hypoxia (HCC)   Acute ST elevation myocardial infarction (STEMI) (HCC)  1.  CAD - s/p thrombectomy and stenting of the RCA No significant lesion on the left side.  Continue DAPT for 1 year Coreg was increased  to 25  BID last night  HR continues to be elevated.  Can consider increasing up to 50 BID tomorrow if he remains tachycardic   2. Cardiac arrest.  Further management per PCCM.   still no purposeful movement  Further management per PCCM   3. Hyperlipidemia:  Continue atorvastatin   4. DM :  Continue insulin therapy .   5. Post arrest encephalopathy:  Not waking up yet . Further eval and management per neurology .    Disposition:  Length of Stay: 6  Vesta Mixer, Montez Hageman., MD, [facility] 01/28/2015, 8:14 AM Office [phone] Pager [phone]

## 2015-01-28 NOTE — Care Management Note (Addendum)
Case Management Note  Patient Details  Name: Albert Jensen MRN: [id number] Date of Birth: 08/15/1970  Subjective/Objective:          [employer] - Victorino DikeJennifer called - Has gotten clinicals - is available for discharge needs - phone number [phone], fax [phone].  Stated didn't have results of cath with her 21 pages that she received.  Resent the latest progress note with cath results.           Action/Plan:   Expected Discharge Date:                  Expected Discharge Plan:     In-House Referral:     Discharge planning Services  CM Consult, Indigent Health Clinic, Medication Assistance  Post Acute Care Choice:    Choice offered to:     DME Arranged:    DME Agency:     HH Arranged:    HH Agency:     Status of Service:     Medicare Important Message Given:    Date Medicare IM Given:    Medicare IM give by:    Date Additional Medicare IM Given:    Additional Medicare Important Message give by:     If discussed at Long Length of Stay Meetings, dates discussed:    Additional Comments: CM assessed pt, pt is non verbal and was unable to communicate with CM.  CM consulted with [facility] and [facility].  CM was informed by [facility] liaison that pt would need to pay remainder of $5000 copay then 40% copay (approximately $17,000) because pt is not in network.  CM was informed by [facility] that facility is within network, agency will contact brother and sister in law to discuss possible discharge plan.  CIR recommended HH or LTAC, pt is currently on 6L [place], tube feeds, and IV antibiotics.  Vangie BickerBrown, Sarah Jane, RN 01/28/2015, 4:03 PM

## 2015-01-28 NOTE — Progress Notes (Signed)
[facility] FOR [facility] LAB REPLACEMENT ONLY  The patient does [facility] based on the criteria listed below:   1. Is GFR >/= 40 ml/min? Yes.    Patient's GFR today is >60 2. Is urine output >/= 0.5 ml/kg/hr for the last 6 hours? Yes Patient's UOP is 0.99 ml/kg/hr 3. Is BUN < 60 mg/dL? Yes.    Patient's BUN today is 28 4. Abnormal electrolyte(s):  K 3.3 5. Ordered repletion with: per protocol 6. If a panic level lab has been reported, has the CCM MD in charge been notified? Yes.  .   Physician:  Rocco SereneByrum  Ethan Clayburn McEachran 01/28/2015 6:26 AM

## 2015-01-28 NOTE — Progress Notes (Signed)
Wasted 150cc of fentanyl and 30cc of versed in sink with Sinclair Shiphris Bungque

## 2015-01-28 NOTE — Progress Notes (Signed)
Patient transported to CT and back on ventilator without complications. 

## 2015-01-28 NOTE — Progress Notes (Signed)
Nutrition Follow-up  DOCUMENTATION CODES:   Obesity unspecified  INTERVENTION:   Continue Vital High Protein @ 40 ml/hr  60 ml Prostat TID.   Tube feeding regimen provides 1560 kcal, 174 grams of protein, and 802 ml of H2O.  Total free water: 1802 ml  NUTRITION DIAGNOSIS:   Inadequate oral intake related to inability to eat as evidenced by NPO status. Ongoing.   GOAL:   Provide needs based on ASPEN/SCCM guidelines Met.   MONITOR:   Vent status, Labs, Weight trends, TF tolerance, I & O's  ASSESSMENT:   44yo male with hx HTN, DM presented 10/15 after witnessed arrest.  Patient is currently intubated on ventilator support MV: 11.6 L/min Temp (24hrs), Avg:100.1 F (37.8 C), Min:99.6 F (37.6 C), Max:100.6 F (38.1 C)  Free water: 250 ml every 6 hours = 1000 ml Labs reviewed: potassium low (3.3), CBG's: 227-247 Pt is now weaning, family discussing trach/PEG.   Diet Order:  Diet NPO time specified  Skin:  Wound (see comment) (MASD on sacrum)  Last BM:  10/21  Height:   Ht Readings from Last 1 Encounters:  01/22/15 _0  (1.88 m)   Weight:   Wt Readings from Last 1 Encounters:  01/28/15 258 lb 13.1 oz (117.4 kg)    Ideal Body Weight:  86.3 kg  BMI:  Body mass index is 33.22 kg/(m^2).  Estimated Nutritional Needs:   Kcal:  5075-7322  Protein:  >/= 172 grams   Fluid:  > 1.5 L/day  EDUCATION NEEDS:   No education needs identified at this time  [place], [place], [place] Pager [phone] After Hours Pager

## 2015-01-28 NOTE — Progress Notes (Signed)
ETT holder changed with assistance of RN without any complications.

## 2015-01-28 NOTE — Progress Notes (Signed)
Inpatient Diabetes Program Recommendations  AACE/ADA: New Consensus Statement on Inpatient Glycemic Control (2015)  Target Ranges:  Prepandial:   less than 140 mg/dL      Peak postprandial:   less than 180 mg/dL (1-2 hours)      Critically ill patients:  140 - 180 mg/dL   Review of Glycemic Control Results for Albert Jensen, Albert Jensen (MRN [id number]) as of 01/28/2015 11:37  Ref. Range 01/27/2015 15:27 01/27/2015 19:55 01/27/2015 23:21 01/28/2015 03:55 01/28/2015 07:42  Glucose-Capillary Latest Ref Range: 65-99 mg/dL 409258 (H) 811223 (H) 914231 (H) 227 (H) 228 (H)    Inpatient Diabetes Program Recommendations: Consider adding Novolog 4 units Q4 as tube feed coverage. Thank you  Piedad ClimesGina Julliana Whitmyer BSN, RN,CDE Inpatient Diabetes Coordinator [phone] (team pager)

## 2015-01-28 NOTE — Progress Notes (Signed)
PULMONARY / CRITICAL CARE MEDICINE   Name: Albert Jensen MRN: [id number] DOB: 08/15/1970    ADMISSION DATE:  01/22/2015 CONSULTATION DATE:  10/15  REFERRING MD :  Isabel CapriceVaranassi   CHIEF COMPLAINT:  Cardiac arrest   INITIAL PRESENTATION: 44yo male with hx HTN, DM presented 10/15 after witnessed arrest.  Initial rhythm PEA on EMS arrival with approx 25 mins CPR before ROSC (15 mins bystander and 10 mins with EMS). EKG concerning for STEMI, after negative head CT he was taken urgently to cath lab and PCCM consulted for vent management/hypothermia protocol.   STUDIES:  CT head/c-spine 10/15>>>neg acute  SIGNIFICANT EVENTS: 10/15>cath lab  SUBJECTIVE:  Following some commands this AM but not consistently, mental status remains questionable but weaning well and   VITAL SIGNS: Temp:  [98.2 F (36.8 C)-100.6 F (38.1 C)] 100 F (37.8 C) (10/21 0400) Pulse Rate:  [82-98] 85 (10/21 1116) Resp:  [16-33] 27 (10/21 1116) BP: (118-183)/(57-107) 138/84 mmHg (10/21 1116) SpO2:  [94 %-100 %] 98 % (10/21 1116) FiO2 (%):  [40 %] 40 % (10/21 1116) Weight:  [117.4 kg (258 lb 13.1 oz)] 117.4 kg (258 lb 13.1 oz) (10/21 0500)  HEMODYNAMICS: CVP:  [5 mmHg] 5 mmHg  VENTILATOR SETTINGS: Vent Mode:  [-] PSV;CPAP FiO2 (%):  [40 %] 40 % Set Rate:  [12 bmp] 12 bmp PEEP:  [5 cmH20] 5 cmH20 Pressure Support:  [5 cmH20] 5 cmH20 INTAKE / OUTPUT:  Intake/Output Summary (Last 24 hours) at 01/28/15 1117 Last data filed at 01/28/15 1000  Gross per 24 hour  Intake   1858 ml  Output   2295 ml  Net   -437 ml   PHYSICAL EXAMINATION: General:  wdwn male, appears older than stated age Neuro:  Agitated intermittently and purposeful pulling at ETT, moving all ext to commands and weaning well. HEENT:  PERRL, mm moist, ETT< C-collar. Cardiovascular:  RRR, Nl S1/S2, -M/R/G. Lungs:  Resps even non labored on vent, PSV. Abdomen:  Round, soft, hypoactive bs  Musculoskeletal:  Warm and dry, multiple large areas plaque  psoriasis   LABS:  CBC  Recent Labs Lab 01/26/15 0448 01/27/15 0450 01/28/15 0355  WBC 6.6 9.5 8.6  HGB 10.6* 12.6* 12.0*  HCT 32.8* 38.3* 37.2*  PLT 208 269 260   Coag's  Recent Labs Lab 01/22/15 1128 01/22/15 1430 01/22/15 2200  APTT  --  87*  --   INR 1.09 3.06* 1.08   BMET  Recent Labs Lab 01/26/15 0448 01/27/15 0450 01/28/15 0355  NA 141 150* 144  K 3.5 3.0* 3.3*  CL 110 110 103  CO2 25 31 31   BUN 27* 27* 28*  CREATININE 0.81 0.90 0.84  GLUCOSE 299* 263* 253*   Electrolytes  Recent Labs Lab 01/26/15 0448 01/27/15 0450 01/28/15 0355  CALCIUM 8.3* 9.4 8.9  MG 1.9 1.7 2.1  PHOS 1.7* 1.1* 3.3   Sepsis Markers No results for input(s): LATICACIDVEN, PROCALCITON, O2SATVEN in the last 168 hours. ABG  Recent Labs Lab 01/26/15 0340 01/27/15 0258 01/28/15 0405  PHART 7.362 7.501* 7.469*  PCO2ART 43.4 35.5 40.5  PO2ART 72.4* 68.6* 92.4   Liver Enzymes No results for input(s): AST, ALT, ALKPHOS, BILITOT, ALBUMIN in the last 168 hours. Cardiac Enzymes  Recent Labs Lab 01/22/15 2200 01/23/15 0318 01/23/15 0815  TROPONINI 7.54* 10.30* 11.56*   Glucose  Recent Labs Lab 01/27/15 1154 01/27/15 1527 01/27/15 1955 01/27/15 2321 01/28/15 0355 01/28/15 0742  GLUCAP 259* 258* 223* 231* 227* 228*   Imaging  Mr Brain Wo Contrast  01/28/2015  CLINICAL DATA:  Initial evaluation for acute encephalopathy, status post cardiac arrest. EXAM: MRI HEAD WITHOUT CONTRAST TECHNIQUE: Multiplanar, multiecho pulse sequences of the brain and surrounding structures were obtained without intravenous contrast. COMPARISON:  Prior CT from 01/27/2015. FINDINGS: Study mildly degraded by motion artifact. Mild age-related cerebral volume loss present. No significant white matter disease. There is very subtle mildly increased signal intensity on DWI sequence involving the right caudate (series 3, image 31). Similarly, there is subtly increased diffusion abnormality within  the bilateral hippocampi/mesial temporal lobes (series 5, image 19). Subtly increased T2/FLAIR signal intensity within these regions. Hippocampi themselves are of normal morphology and equal size. There is question of subtle involvement of the left caudate and lentiform nucleus as well. No associated hemorrhage. No acute large vessel territory infarct. Intravascular flow voids maintained. No mass lesion, midline shift, or mass effect. No hydrocephalus. No extra-axial fluid collection. Craniocervical junction within normal limits. Pituitary gland within normal limits. No acute abnormality about the orbits. Mucosal thickening within the sphenoid sinuses, right greater than left. Scattered opacity within the bilateral mastoid air cells. Fluid within the posterior nasopharynx. Patient appears to be intubated. Bone marrow signal intensity within normal limits. Mild scalp edema, which may be related to overall volume status. IMPRESSION: Subtle diffusion abnormality within the bilateral hippocampi/mesial temporal lobes as well as the right caudate. While these findings are nonspecific, primary concern is for possible hypoxic ischemic injury given the history of prolonged cardiac arrest. Electronically Signed   By: Rise Mu M.D.   On: 01/28/2015 01:55   Dg Chest Port 1 View  01/28/2015  CLINICAL DATA:  Acute respiratory failure with hypoxia. Cardiac arrest. Acute myocardial infarction. EXAM: PORTABLE CHEST 1 VIEW COMPARISON:  01/27/2015 and 01/26/2015 FINDINGS: Endotracheal tube and NG tube and central line all appear in good position, unchanged. Heart size and pulmonary vascularity are normal. Minimal atelectasis at the left lung base. IMPRESSION: Minimal atelectasis at the left lung base. Electronically Signed   By: Francene Boyers M.D.   On: 01/28/2015 07:29   ASSESSMENT / PLAN:  PULMONARY OETT 10/15>>> Acute respiratory failure - post cardiac arrest  Patient has multiple ribs dislocated from his  sternum and every time he is placed on a wean internal rebreathing is noted. P:   Changed to PCV for asynchrony concerns, PEEP to 5 and FiO2 to 40% ?extubation now that he is weaning well, mental status is poor however. F/u CXR. F/u ABG. Adjust vent for ABG.  CARDIOVASCULAR CVL L IJ 10/16>>> Cardiac arrest - PEA  STEMI  RCA occlusion - s/p stenting 10/15 P:  Anticoagulation per cards. 2D echo not ordered (delayed until patient is able to comply with tech to get a good study) but EF is 55-65% on cath. ASA. Normothermia protocol complete. Hold home lisinopril, tricor, HCTZ. Increased Coreg to 25 BID, continue.  RENAL Hypo K, Mg and Phos. P:   F/u chem. KVO IVF. Replace electrolytes as indicated. Lasix 40 mg IV q6 x3 doses. Free water 250 q6.Marland Kitchen  GASTROINTESTINAL No active issue  P:   PPI. Nutrition for TF.  HEMATOLOGIC No active issue  P:  Anticoagulation per cards. F/u CBC. SQ heparin.  INFECTIOUS UTI  P:   Urine culture 10/16>>>  Rocephin 10/16>>>  ENDOCRINE Hx DM  P:   Off insulin drip. Resistant scale and lantus 30. Hold home metformin.  NEUROLOGIC AMS - post cardiac arrest  Fall - head CT neg  Shivering  Patient  was following commands on 10/18, 10/19 was very much sedated and not following commands, no 10/20 patient is off sedation and is not arousable or following command. P:   RASS goal: 0 to -2. Fentanyl drip for pain off for now. D/C versed Normothermia protocol - complete  Reorder EEG and CT noted. MRI with ?CVA associated with anoxic injury from arrest. Neurology feels prolonged rehab will be needed. Neurology consult called to Dr. Roseanne Reno, appreciate input. Will need to clear c-spine once awake.  FAMILY  - Updates: Both brothers updated bedside, since patient is weaning well, we posed the question of trach/peg if extubated then fails extubation.  Family is discussing this right now.   The patient is critically ill with multiple organ  systems failure and requires high complexity decision making for assessment and support, frequent evaluation and titration of therapies, application of advanced monitoring technologies and extensive interpretation of multiple databases.   Critical Care Time devoted to patient care services described in this note is  35  Minutes. This time reflects time of care of this signee Dr Koren Bound. This critical care time does not reflect procedure time, or teaching time or supervisory time of PA/NP/Med student/Med Resident etc but could involve care discussion time.  Alyson Reedy, M.D. [facility] Pulmonary/Critical Care Medicine. Pager: [phone]. After hours pager: [phone].  01/28/2015  11:17 AM

## 2015-01-29 ENCOUNTER — Inpatient Hospital Stay (HOSPITAL_COMMUNITY): Payer: Managed Care, Other (non HMO)

## 2015-01-29 DIAGNOSIS — J9600 Acute respiratory failure, unspecified whether with hypoxia or hypercapnia: Secondary | ICD-10-CM | POA: Diagnosis present

## 2015-01-29 LAB — CBC
HEMATOCRIT: 38.8 % — AB (ref 39.0–52.0)
HEMOGLOBIN: 12.5 g/dL — AB (ref 13.0–17.0)
MCH: 28.9 pg (ref 26.0–34.0)
MCHC: 32.2 g/dL (ref 30.0–36.0)
MCV: 89.8 fL (ref 78.0–100.0)
Platelets: 279 10*3/uL (ref 150–400)
RBC: 4.32 MIL/uL (ref 4.22–5.81)
RDW: 14 % (ref 11.5–15.5)
WBC: 8.5 10*3/uL (ref 4.0–10.5)

## 2015-01-29 LAB — POCT I-STAT 3, ART BLOOD GAS (G3+)
Acid-Base Excess: 10 mmol/L — ABNORMAL HIGH (ref 0.0–2.0)
Bicarbonate: 34.1 mEq/L — ABNORMAL HIGH (ref 20.0–24.0)
O2 SAT: 94 %
PH ART: 7.497 — AB (ref 7.350–7.450)
TCO2: 35 mmol/L (ref 0–100)
pCO2 arterial: 44.5 mmHg (ref 35.0–45.0)
pO2, Arterial: 70 mmHg — ABNORMAL LOW (ref 80.0–100.0)

## 2015-01-29 LAB — BASIC METABOLIC PANEL
ANION GAP: 8 (ref 5–15)
BUN: 30 mg/dL — AB (ref 6–20)
CHLORIDE: 100 mmol/L — AB (ref 101–111)
CO2: 32 mmol/L (ref 22–32)
Calcium: 9 mg/dL (ref 8.9–10.3)
Creatinine, Ser: 0.85 mg/dL (ref 0.61–1.24)
GFR calc Af Amer: >60 mL/min (ref >60)
GLUCOSE: 290 mg/dL — AB (ref 65–99)
POTASSIUM: 3.6 mmol/L (ref 3.5–5.1)
Sodium: 140 mmol/L (ref 135–145)

## 2015-01-29 LAB — GLUCOSE, CAPILLARY
GLUCOSE-CAPILLARY: 252 mg/dL — AB (ref 65–99)
GLUCOSE-CAPILLARY: 254 mg/dL — AB (ref 65–99)
GLUCOSE-CAPILLARY: 309 mg/dL — AB (ref 65–99)
Glucose-Capillary: 275 mg/dL — ABNORMAL HIGH (ref 65–99)
Glucose-Capillary: 283 mg/dL — ABNORMAL HIGH (ref 65–99)
Glucose-Capillary: 292 mg/dL — ABNORMAL HIGH (ref 65–99)

## 2015-01-29 LAB — PROCALCITONIN: Procalcitonin: 0.34 ng/mL

## 2015-01-29 LAB — MAGNESIUM: Magnesium: 2.2 mg/dL (ref 1.7–2.4)

## 2015-01-29 LAB — PHOSPHORUS: PHOSPHORUS: 3 mg/dL (ref 2.5–4.6)

## 2015-01-29 MED ORDER — MIDAZOLAM HCL 2 MG/2ML IJ SOLN
1.0000 mg | Freq: Once | INTRAMUSCULAR | Status: AC
  Administered 2015-01-29: 1 mg via INTRAVENOUS
  Filled 2015-01-29: qty 2

## 2015-01-29 MED ORDER — VANCOMYCIN HCL 10 G IV SOLR
2500.0000 mg | Freq: Once | INTRAVENOUS | Status: AC
  Administered 2015-01-29: 2500 mg via INTRAVENOUS
  Filled 2015-01-29: qty 2500

## 2015-01-29 MED ORDER — PIPERACILLIN-TAZOBACTAM 3.375 G IVPB
3.3750 g | Freq: Three times a day (TID) | INTRAVENOUS | Status: DC
  Administered 2015-01-29 – 2015-02-04 (×18): 3.375 g via INTRAVENOUS
  Filled 2015-01-29 (×24): qty 50

## 2015-01-29 MED ORDER — POTASSIUM CHLORIDE 20 MEQ/15ML (10%) PO SOLN
20.0000 meq | Freq: Every day | ORAL | Status: DC
  Administered 2015-01-29 – 2015-02-08 (×11): 20 meq
  Filled 2015-01-29 (×12): qty 15

## 2015-01-29 MED ORDER — INSULIN GLARGINE 100 UNIT/ML ~~LOC~~ SOLN
40.0000 [IU] | SUBCUTANEOUS | Status: DC
  Administered 2015-01-30: 40 [IU] via SUBCUTANEOUS
  Filled 2015-01-29 (×2): qty 0.4

## 2015-01-29 MED ORDER — VANCOMYCIN HCL IN DEXTROSE 1-5 GM/200ML-% IV SOLN
1000.0000 mg | Freq: Three times a day (TID) | INTRAVENOUS | Status: DC
  Administered 2015-01-29 – 2015-01-30 (×3): 1000 mg via INTRAVENOUS
  Filled 2015-01-29 (×5): qty 200

## 2015-01-29 MED ORDER — INSULIN ASPART 100 UNIT/ML ~~LOC~~ SOLN
0.0000 [IU] | SUBCUTANEOUS | Status: DC
Start: 2015-01-29 — End: 2015-02-08
  Administered 2015-01-29 (×2): 11 [IU] via SUBCUTANEOUS
  Administered 2015-01-29: 15 [IU] via SUBCUTANEOUS
  Administered 2015-01-29: 11 [IU] via SUBCUTANEOUS
  Administered 2015-01-30: 7 [IU] via SUBCUTANEOUS
  Administered 2015-01-30: 15 [IU] via SUBCUTANEOUS
  Administered 2015-01-30: 7 [IU] via SUBCUTANEOUS
  Administered 2015-01-30: 15 [IU] via SUBCUTANEOUS
  Administered 2015-01-30: 11 [IU] via SUBCUTANEOUS
  Administered 2015-01-30: 7 [IU] via SUBCUTANEOUS
  Administered 2015-01-31: 15 [IU] via SUBCUTANEOUS
  Administered 2015-01-31: 12 [IU] via SUBCUTANEOUS
  Administered 2015-01-31 (×2): 7 [IU] via SUBCUTANEOUS
  Administered 2015-01-31: 11 [IU] via SUBCUTANEOUS
  Administered 2015-02-01: 4 [IU] via SUBCUTANEOUS
  Administered 2015-02-01: 7 [IU] via SUBCUTANEOUS
  Administered 2015-02-01: 11 [IU] via SUBCUTANEOUS
  Administered 2015-02-01: 7 [IU] via SUBCUTANEOUS
  Administered 2015-02-01: 20 [IU] via SUBCUTANEOUS
  Administered 2015-02-01: 7 [IU] via SUBCUTANEOUS
  Administered 2015-02-02 (×2): 4 [IU] via SUBCUTANEOUS
  Administered 2015-02-02: 7 [IU] via SUBCUTANEOUS
  Administered 2015-02-02: 3 [IU] via SUBCUTANEOUS
  Administered 2015-02-02: 7 [IU] via SUBCUTANEOUS
  Administered 2015-02-02: 4 [IU] via SUBCUTANEOUS
  Administered 2015-02-03: 15 [IU] via SUBCUTANEOUS
  Administered 2015-02-03: 11 [IU] via SUBCUTANEOUS
  Administered 2015-02-03: 15 [IU] via SUBCUTANEOUS
  Administered 2015-02-03: 7 [IU] via SUBCUTANEOUS
  Administered 2015-02-03: 15 [IU] via SUBCUTANEOUS
  Administered 2015-02-04: 7 [IU] via SUBCUTANEOUS
  Administered 2015-02-04: 20 [IU] via SUBCUTANEOUS
  Administered 2015-02-04: 11 [IU] via SUBCUTANEOUS
  Administered 2015-02-04: 15 [IU] via SUBCUTANEOUS
  Administered 2015-02-04 (×2): 7 [IU] via SUBCUTANEOUS
  Administered 2015-02-04 – 2015-02-05 (×2): 11 [IU] via SUBCUTANEOUS
  Administered 2015-02-05: 15 [IU] via SUBCUTANEOUS
  Administered 2015-02-05: 7 [IU] via SUBCUTANEOUS
  Administered 2015-02-05: 15 [IU] via SUBCUTANEOUS
  Administered 2015-02-05: 7 [IU] via SUBCUTANEOUS
  Administered 2015-02-05: 15 [IU] via SUBCUTANEOUS
  Administered 2015-02-06: 7 [IU] via SUBCUTANEOUS
  Administered 2015-02-06 – 2015-02-07 (×5): 11 [IU] via SUBCUTANEOUS
  Administered 2015-02-07: 7 [IU] via SUBCUTANEOUS
  Administered 2015-02-07 (×2): 11 [IU] via SUBCUTANEOUS
  Administered 2015-02-07: 7 [IU] via SUBCUTANEOUS
  Administered 2015-02-07 (×2): 11 [IU] via SUBCUTANEOUS
  Administered 2015-02-08: 7 [IU] via SUBCUTANEOUS
  Administered 2015-02-08: 11 [IU] via SUBCUTANEOUS
  Administered 2015-02-08: 7 [IU] via SUBCUTANEOUS

## 2015-01-29 NOTE — Progress Notes (Signed)
PULMONARY / CRITICAL CARE MEDICINE   Name: Albert MarylandRicky Dunnavant MRN: [id number] DOB: 01/13/1971    ADMISSION DATE:  01/22/2015 CONSULTATION DATE:  10/15  REFERRING MD :  Isabel CapriceVaranassi   CHIEF COMPLAINT:  Cardiac arrest   INITIAL PRESENTATION: 44 y/o male with hx HTN, DM presented 10/15 after witnessed arrest.  Initial rhythm PEA on EMS arrival with approx 25 mins CPR before ROSC (15 mins bystander and 10 mins with EMS). EKG concerning for STEMI, after negative head CT he was taken urgently to cath lab and PCCM consulted for vent management/hypothermia protocol.   STUDIES:  CT head/c-spine 10/15 >> neg for acute process  SIGNIFICANT EVENTS: 10/15  Admit after witnessed cardiac arrest.  To cath lab  SUBJECTIVE:  RN reports tmax of 101.5, hyperglycemia and increased ETT secretions  VITAL SIGNS: Temp:  [99.6 F (37.6 C)-101.1 F (38.4 C)] 100.1 F (37.8 C) (10/22 0800) Pulse Rate:  [81-93] 88 (10/22 0757) Resp:  [17-27] 19 (10/22 0757) BP: (119-153)/(75-91) 134/84 mmHg (10/22 0757) SpO2:  [88 %-100 %] 100 % (10/22 0757) FiO2 (%):  [40 %] 40 % (10/22 0800) Weight:  [251 lb 5.2 oz (114 kg)] 251 lb 5.2 oz (114 kg) (10/22 0500)  HEMODYNAMICS:    VENTILATOR SETTINGS: Vent Mode:  [-] PSV;CPAP FiO2 (%):  [40 %] 40 % Set Rate:  [12 bmp] 12 bmp PEEP:  [5 cmH20] 5 cmH20 Pressure Support:  [5 cmH20-10 cmH20] 10 cmH20 INTAKE / OUTPUT:  Intake/Output Summary (Last 24 hours) at 01/29/15 [id number] Last data filed at 01/29/15 0800  Gross per 24 hour  Intake   2525 ml  Output   5270 ml  Net  -2745 ml   PHYSICAL EXAMINATION: General:  wdwn male, appears older than stated age Neuro:  Alert, tracks, intermittently follows commands HEENT:  PERRL, mm moist, ETT,  C-collar in place. Cardiovascular:  RRR, Nl S1/S2, -M/R/G. Lungs:  Resps even non labored on vent, lungs bilaterally coarse  Abdomen:  Round, soft, hypoactive bs  Musculoskeletal:  Warm and dry, multiple large areas plaque psoriasis    LABS:  CBC  Recent Labs Lab 01/27/15 0450 01/28/15 0355 01/29/15 0425  WBC 9.5 8.6 8.5  HGB 12.6* 12.0* 12.5*  HCT 38.3* 37.2* 38.8*  PLT 269 260 279   Coag's  Recent Labs Lab 01/22/15 1128 01/22/15 1430 01/22/15 2200  APTT  --  87*  --   INR 1.09 3.06* 1.08   BMET  Recent Labs Lab 01/27/15 0450 01/28/15 0355 01/29/15 0425  NA 150* 144 140  K 3.0* 3.3* 3.6  CL 110 103 100*  CO2 31 31 32  BUN 27* 28* 30*  CREATININE 0.90 0.84 0.85  GLUCOSE 263* 253* 290*   Electrolytes  Recent Labs Lab 01/27/15 0450 01/28/15 0355 01/29/15 0425  CALCIUM 9.4 8.9 9.0  MG 1.7 2.1 2.2  PHOS 1.1* 3.3 3.0   Sepsis Markers No results for input(s): LATICACIDVEN, PROCALCITON, O2SATVEN in the last 168 hours.   ABG  Recent Labs Lab 01/27/15 0258 01/28/15 0405 01/29/15 0508  PHART 7.501* 7.469* 7.497*  PCO2ART 35.5 40.5 44.5  PO2ART 68.6* 92.4 70.0*   Liver Enzymes No results for input(s): AST, ALT, ALKPHOS, BILITOT, ALBUMIN in the last 168 hours.   Cardiac Enzymes  Recent Labs Lab 01/22/15 2200 01/23/15 0318 01/23/15 0815  TROPONINI 7.54* 10.30* 11.56*   Glucose  Recent Labs Lab 01/28/15 1121 01/28/15 1627 01/28/15 1944 01/29/15 0045 01/29/15 0429 01/29/15 0743  GLUCAP 247* 210* 315* 275* 252* 254*  Imaging Dg Chest Port 1 View  01/29/2015  CLINICAL DATA:  Intubated.  Shortness of breath. EXAM: PORTABLE CHEST 1 VIEW COMPARISON:  01/28/2015 FINDINGS: Endotracheal tube remains in place with tip at the level of the clavicular heads. Left internal jugular central venous catheter is unchanged. Enteric tube courses into the left upper abdomen, incompletely imaged. Cardiomediastinal silhouette is unchanged and within normal limits. Lung volumes are slightly lower than on the prior study with mildly increased left basilar opacity. No sizable pleural effusion or pneumothorax is identified. IMPRESSION: Increased left basilar atelectasis. Electronically Signed    By: Sebastian Ache M.D.   On: 01/29/2015 07:52   ASSESSMENT / PLAN:  PULMONARY OETT 10/15 >> Acute respiratory failure - post cardiac arrest  Patient has multiple ribs dislocated from his sternum and every time he is placed on a wean internal rebreathing is noted. P:   PCV for asynchrony concerns, PEEP to 5 and FiO2 to 40%  ?extubation due to mental status, good mechanics with weaning but not safe with current mental status Intermittent CXR / ABG Daily weaning but no plan for extubation yet.  Family discussing idea of trach/PEG if fails extubation (no decision as of 10/22)  CARDIOVASCULAR CVL L IJ 10/16 >> Cardiac arrest - PEA  STEMI  RCA occlusion - s/p stenting 10/15 P:  Anticoagulation per cards. 2D echo not yet ordered (delayed until patient is able to comply with tech to get a good study) but EF is 55-65% on cath. ASA. Normothermia protocol complete. Hold home lisinopril, tricor, HCTZ. Continue Coreg to 25 BID  RENAL Hypo K, Mg and Phos. P:   Trend BMP / UOP  KVO IVF. Replace electrolytes as indicated. Lasix 40 mg BID  KCL 20 mEq QD Free water 250 q6.Marland Kitchen  GASTROINTESTINAL No active issue  P:   PPI. Nutrition for TF.  HEMATOLOGIC No active issue  P:  Anticoagulation per cards. F/u CBC. SQ heparin.  INFECTIOUS UTI  Fever - tmax 101.5 10/22 P:   Urine culture 10/16 >> negative  Sputum 10/22 >>   Rocephin 10/16 >> 10/20 Vanco 10/22 >>  Zosyn 10/22 >>   Follow fever curve / WBC  Assess PCT   ENDOCRINE Hx DM  P:   Change to resistant scale  Increase Lantus to 40 units QD 10/22. Hold home metformin.  NEUROLOGIC AMS / Hypoxic Encephalopathy  - post cardiac arrest  Fall - head CT neg  Shivering - resolved  Patient was following commands on 10/18, 10/19 was very much sedated and not following commands, no 10/20 patient is off sedation and is not arousable or following command. P:   RASS goal: 0 . MRI with ?CVA associated with anoxic injury from  arrest. Neurology feels prolonged rehab will be needed. Neurology consult called to Dr. Roseanne Reno, appreciate input. Will need to clear c-spine once awake. Continue C-Collar   FAMILY  - Updates: No family at bedside.      Canary Brim, NP-C [facility] Pgr: [phone] or if no answer [phone] 01/29/2015, 9:52 AM

## 2015-01-29 NOTE — Progress Notes (Signed)
PROGRESS NOTE  Subjective:   44 yo man, no previos hx of CAD Admitted 10/15 with cardiac arrest.  Cath showed no significant disease in the LM, LAD, Lcx. RCA had a thrombotic lesion .   Had thrombectomy and stenting .  Had the [place] sun cooling protocol.  Off sedation.  Opens eyes,  Blinks .    Echo  is basically normal  - mild diastolic dysfunction. Normal EF -65%  Going for head CT today   Objective:    Vital Signs:   Temp:  [99.6 F (37.6 C)-101.1 F (38.4 C)] 100.1 F (37.8 C) (10/22 0800) Pulse Rate:  [81-93] 88 (10/22 0757) Resp:  [17-27] 19 (10/22 0757) BP: (119-153)/(75-91) 134/84 mmHg (10/22 0757) SpO2:  [88 %-100 %] 100 % (10/22 0757) FiO2 (%):  [40 %] 40 % (10/22 0800) Weight:  [251 lb 5.2 oz (114 kg)] 251 lb 5.2 oz (114 kg) (10/22 0500)  Last BM Date: 01/28/15   24-hour weight change: Weight change: -7 lb 7.9 oz (-3.4 kg)  Weight trends: Filed Weights   01/27/15 0500 01/28/15 0500 01/29/15 0500  Weight: 258 lb 6.1 oz (117.2 kg) 258 lb 13.1 oz (117.4 kg) 251 lb 5.2 oz (114 kg)    Intake/Output:  10/21 0701 - 10/22 0700 In: 2835 [XB/MW:4132[NG/GT:2635; IV Piggyback:200] Out: 5315 [Urine:5315] Total I/O In: 110 [NG/GT:110] Out: 275 [Urine:275]   Physical Exam: BP 134/84 mmHg  Pulse 88  Temp(Src) 100.1 F (37.8 C) (Core (Comment))  Resp 19  Ht 6\' 2"  (1.88 m)  Wt 251 lb 5.2 oz (114 kg)  BMI 32.25 kg/m2  SpO2 100%  Wt Readings from Last 3 Encounters:  01/29/15 251 lb 5.2 oz (114 kg)  03/05/12 287 lb 6.4 oz (130.364 kg)  05/24/10 276 lb (125.193 kg)    General: Vital signs reviewed and noted. On the vent   Head: Abrasion on back of his head   Eyes: Not assessed.   Throat: ETT in place   Neck:  normal   Lungs:   on vent   Heart:  RR , tachy  Abdomen:  Soft, non-tender, non-distended    Extremities: No edema   Neurologic: Moves, opens eyes,   Psych: NA    Labs: BMET:  Recent Labs  01/28/15 0355 01/29/15 0425  NA 144 140  K  3.3* 3.6  CL 103 100*  CO2 31 32  GLUCOSE 253* 290*  BUN 28* 30*  CREATININE 0.84 0.85  CALCIUM 8.9 9.0  MG 2.1 2.2  PHOS 3.3 3.0    Liver function tests: No results for input(s): AST, ALT, ALKPHOS, BILITOT, PROT, ALBUMIN in the last 72 hours. No results for input(s): LIPASE, AMYLASE in the last 72 hours.  CBC:  Recent Labs  01/28/15 0355 01/29/15 0425  WBC 8.6 8.5  HGB 12.0* 12.5*  HCT 37.2* 38.8*  MCV 89.6 89.8  PLT 260 279    Cardiac Enzymes: No results for input(s): CKTOTAL, CKMB, TROPONINI in the last 72 hours.  Coagulation Studies: No results for input(s): LABPROT, INR in the last 72 hours.  Other: Invalid input(s): POCBNP No results for input(s): DDIMER in the last 72 hours. No results for input(s): HGBA1C in the last 72 hours. No results for input(s): CHOL, HDL, LDLCALC, TRIG, CHOLHDL in the last 72 hours. No results for input(s): TSH, T4TOTAL, T3FREE, THYROIDAB in the last 72 hours.  Invalid input(s): FREET3 No results for input(s): VITAMINB12, FOLATE, FERRITIN, TIBC, IRON, RETICCTPCT in the last  72 hours.   Other results:  Tele  ( personally reviewed )  - sinus tach , occasional PVCs  Medications:    Infusions: . fentaNYL infusion INTRAVENOUS Stopped (01/26/15 2300)    Scheduled Medications: . antiseptic oral rinse  7 mL Mouth Rinse 10 times per day  . aspirin  81 mg Per Tube Daily  . atorvastatin  80 mg Oral q1800  . carvedilol  25 mg Oral BID WC  . chlorhexidine gluconate  15 mL Mouth Rinse BID  . enoxaparin (LOVENOX) injection  40 mg Subcutaneous Q24H  . feeding supplement (PRO-STAT SUGAR FREE 64)  60 mL Per Tube TID  . feeding supplement (VITAL HIGH PROTEIN)  1,000 mL Per Tube Q24H  . free water  250 mL Per Tube Q6H  . furosemide  40 mg Intravenous BID  . insulin aspart  0-20 Units Subcutaneous 6 times per day  . [START ON 01/30/2015] insulin glargine  40 Units Subcutaneous Q24H  . pantoprazole sodium  40 mg Per Tube Daily  .  potassium chloride  20 mEq Per Tube Daily  . sodium chloride  3 mL Intravenous Q12H  . ticagrelor  90 mg Per Tube BID    Assessment/ Plan:   Active Problems:   Cardiac arrest (HCC)   Acute MI, inferolateral wall, initial episode of care (HCC)   Respiratory failure (HCC)   Anoxic encephalopathy (HCC)   Acute respiratory failure with hypoxia (HCC)   Acute ST elevation myocardial infarction (STEMI) (HCC)  1.  CAD - s/p thrombectomy and stenting of the RCA No significant lesion on the left side.  Continue DAPT for 1 year Coreg was increased  to 25  BID last night  HR continues to be elevated.  HR and BP are well controlled.  Continue current meds.    2. Cardiac arrest.  Further management per PCCM.   still no purposeful movement  Further management per PCCM   3. Hyperlipidemia:  Continue atorvastatin   4. DM :  Continue insulin therapy .   5. Post arrest encephalopathy:  Not waking up yet . Further eval and management per neurology .   No active cardiac issues. Continue current meds Will sign off. Call for questions   Disposition:  Length of Stay: 7  Vesta Mixer, Montez Hageman., MD, [facility] 01/29/2015, 10:24 AM Office [phone] Pager [phone]
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
## 2015-01-29 NOTE — Progress Notes (Signed)
Subjective: Patient was alert today and actively tracts visually. Remains intubated but is currently being weaned from the ventilator.  Objective: Current vital signs: BP 134/84 mmHg  Pulse 88  Temp(Src) 100.1 F (37.8 C) (Core (Comment))  Resp 19  Ht 6\' 2"  (1.88 m)  Wt 114 kg (251 lb 5.2 oz)  BMI 32.25 kg/m2  SpO2 100%  Neurologic Exam: Patient was alert and in no acute distress. He readily tract visually toward verbal stimulation. He was able to follow simple commands with use of his left upper extremity and both lower extremities, although movements were minimal.  Medications: I have reviewed the patient's current medications.  Assessment/Plan: 44 year old man with hypoxic brain injury secondary to cardiac arrest on 01/22/2015. Patient has regained consciousness, but has severe diffuse impairment, and will require extensive and probably protracted rehabilitation intervention.  Recommend no changes in current management. We will continue to follow this patient with you.  C.R. Roseanne RenoStewart, MD [facility] [phone]  01/29/2015  8:48 AM

## 2015-01-29 NOTE — Progress Notes (Signed)
Suctioned for sputum 

## 2015-01-30 ENCOUNTER — Inpatient Hospital Stay (HOSPITAL_COMMUNITY): Payer: Managed Care, Other (non HMO)

## 2015-01-30 DIAGNOSIS — G931 Anoxic brain damage, not elsewhere classified: Secondary | ICD-10-CM

## 2015-01-30 LAB — CBC
HEMATOCRIT: 37.7 % — AB (ref 39.0–52.0)
Hemoglobin: 12.3 g/dL — ABNORMAL LOW (ref 13.0–17.0)
MCH: 29.4 pg (ref 26.0–34.0)
MCHC: 32.6 g/dL (ref 30.0–36.0)
MCV: 90.2 fL (ref 78.0–100.0)
PLATELETS: 311 10*3/uL (ref 150–400)
RBC: 4.18 MIL/uL — ABNORMAL LOW (ref 4.22–5.81)
RDW: 13.8 % (ref 11.5–15.5)
WBC: 9.3 10*3/uL (ref 4.0–10.5)

## 2015-01-30 LAB — GLUCOSE, CAPILLARY
GLUCOSE-CAPILLARY: 209 mg/dL — AB (ref 65–99)
GLUCOSE-CAPILLARY: 219 mg/dL — AB (ref 65–99)
GLUCOSE-CAPILLARY: 284 mg/dL — AB (ref 65–99)
GLUCOSE-CAPILLARY: 292 mg/dL — AB (ref 65–99)
GLUCOSE-CAPILLARY: 311 mg/dL — AB (ref 65–99)
Glucose-Capillary: 224 mg/dL — ABNORMAL HIGH (ref 65–99)
Glucose-Capillary: 303 mg/dL — ABNORMAL HIGH (ref 65–99)

## 2015-01-30 LAB — BASIC METABOLIC PANEL
Anion gap: 8 (ref 5–15)
BUN: 34 mg/dL — AB (ref 6–20)
CHLORIDE: 104 mmol/L (ref 101–111)
CO2: 32 mmol/L (ref 22–32)
CREATININE: 1.02 mg/dL (ref 0.61–1.24)
Calcium: 9.1 mg/dL (ref 8.9–10.3)
GFR calc Af Amer: >60 mL/min (ref >60)
GFR calc non Af Amer: >60 mL/min (ref >60)
GLUCOSE: 239 mg/dL — AB (ref 65–99)
POTASSIUM: 3.6 mmol/L (ref 3.5–5.1)
Sodium: 144 mmol/L (ref 135–145)

## 2015-01-30 LAB — URINE CULTURE: Culture: NO GROWTH

## 2015-01-30 LAB — VANCOMYCIN, TROUGH: Vancomycin Tr: 24 ug/mL — ABNORMAL HIGH (ref 10.0–20.0)

## 2015-01-30 LAB — PROCALCITONIN: Procalcitonin: 0.3 ng/mL

## 2015-01-30 MED ORDER — SODIUM CHLORIDE 0.9 % IV SOLN
1250.0000 mg | Freq: Two times a day (BID) | INTRAVENOUS | Status: DC
  Filled 2015-01-30: qty 1250

## 2015-01-30 NOTE — Progress Notes (Signed)
PULMONARY / CRITICAL CARE MEDICINE   Name: Albert Jensen MRN: [id number] DOB: October 29, 1970    ADMISSION DATE:  01/22/2015 CONSULTATION DATE:  10/15  REFERRING MD :  Isabel Caprice   CHIEF COMPLAINT:  Cardiac arrest   INITIAL PRESENTATION: 44 y/o male with hx HTN, DM presented 10/15 after witnessed arrest.  Initial rhythm PEA on EMS arrival with approx 25 mins CPR before ROSC (15 mins bystander and 10 mins with EMS). EKG concerning for STEMI, after negative head CT he was taken urgently to cath lab and PCCM consulted for vent management/hypothermia protocol.   STUDIES:  CT head/c-spine 10/15 >> neg for acute process ECHO 10/18 >> LV mild dilation, EF 65-70%, no RWMA, normal PA pressure  SIGNIFICANT EVENTS: 10/15  Admit after witnessed cardiac arrest.  To cath lab  SUBJECTIVE:  RN reports intermittent periods of agitation, attempts to pull at ETT, then has periods of calm with minimal interaction.  Notes intermittently follows commands.  Tmax 100.1  VITAL SIGNS: Temp:  [99.6 F (37.6 C)-100.1 F (37.8 C)] 100.1 F (37.8 C) (10/23 0759) Pulse Rate:  [81-101] 101 (10/23 0834) Resp:  [14-27] 24 (10/23 0834) BP: (98-146)/(58-86) 118/79 mmHg (10/23 0834) SpO2:  [98 %-100 %] 100 % (10/23 0834) FiO2 (%):  [40 %] 40 % (10/23 0834) Weight:  [241 lb 13.5 oz (109.7 kg)] 241 lb 13.5 oz (109.7 kg) (10/23 0400)  HEMODYNAMICS:    VENTILATOR SETTINGS: Vent Mode:  [-] PSV;CPAP FiO2 (%):  [40 %] 40 % Set Rate:  [12 bmp] 12 bmp PEEP:  [5 cmH20] 5 cmH20 Pressure Support:  [5 cmH20-10 cmH20] 5 cmH20 INTAKE / OUTPUT:  Intake/Output Summary (Last 24 hours) at 01/30/15 1004 Last data filed at 01/30/15 0800  Gross per 24 hour  Intake   2370 ml  Output   1800 ml  Net    570 ml   PHYSICAL EXAMINATION: General:  wdwn male, appears older than stated age Neuro:  Alert, tracks, intermittently follows commands, MAE HEENT:  PERRL, mm moist, ETT,  C-collar in place. Cardiovascular:  RRR, Nl S1/S2,  -M/R/G. Lungs:  Resps even non labored on vent, lungs bilaterally coarse  Abdomen:  Round, soft, hypoactive bs  Musculoskeletal:  Warm and dry, multiple large areas plaque psoriasis   LABS:  CBC  Recent Labs Lab 01/28/15 0355 01/29/15 0425 01/30/15 0330  WBC 8.6 8.5 9.3  HGB 12.0* 12.5* 12.3*  HCT 37.2* 38.8* 37.7*  PLT 260 279 311   Coag's No results for input(s): APTT, INR in the last 168 hours. BMET  Recent Labs Lab 01/28/15 0355 01/29/15 0425 01/30/15 0330  NA 144 140 144  K 3.3* 3.6 3.6  CL 103 100* 104  CO2 31 32 32  BUN 28* 30* 34*  CREATININE 0.84 0.85 1.02  GLUCOSE 253* 290* 239*   Electrolytes  Recent Labs Lab 01/27/15 0450 01/28/15 0355 01/29/15 0425 01/30/15 0330  CALCIUM 9.4 8.9 9.0 9.1  MG 1.7 2.1 2.2  --   PHOS 1.1* 3.3 3.0  --    Sepsis Markers  Recent Labs Lab 01/29/15 1550 01/30/15 0330  PROCALCITON 0.34 0.30     ABG  Recent Labs Lab 01/27/15 0258 01/28/15 0405 01/29/15 0508  PHART 7.501* 7.469* 7.497*  PCO2ART 35.5 40.5 44.5  PO2ART 68.6* 92.4 70.0*   Liver Enzymes No results for input(s): AST, ALT, ALKPHOS, BILITOT, ALBUMIN in the last 168 hours.   Cardiac Enzymes No results for input(s): TROPONINI, PROBNP in the last 168 hours. Glucose  Recent Labs Lab 01/29/15 1246 01/29/15 1549 01/29/15 1956 01/29/15 2342 01/30/15 0332 01/30/15 0728  GLUCAP 309* 292* 283* 292* 224* 219*   Imaging Dg Chest Port 1 View  01/30/2015  CLINICAL DATA:  Acute respiratory failure, hypertension, diabetes mellitus EXAM: PORTABLE CHEST 1 VIEW COMPARISON:  Portable exam 0446 hours compared to 01/29/2015 FINDINGS: Rotated to the LEFT. Nasogastric tube extends into stomach. LEFT jugular central venous catheter tip projects over whole LEFT brachiocephalic vein near SVC confluence. Questionable visualization of an endotracheal tube at the thoracic inlet approximately 8.6 cm above carina. Stable heart size mediastinal contours for degree of  rotation. Bibasilar atelectasis versus infiltrate, slightly increased. Superimposed artifacts without definite upper lobe infiltrate or pneumothorax. IMPRESSION: Probable endotracheal tube tip at thoracic inlet 8.6 cm above carina ; this can be advanced 4 cm if desired for more stable positioning in the thorax trachea. Increased bibasilar atelectasis versus infiltrate. Electronically Signed   By: Ulyses SouthwardMark  Boles M.D.   On: 01/30/2015 07:34   Dg Chest Port 1 View  01/30/2015  CLINICAL DATA:  Endotracheal tube placement.  Initial encounter. EXAM: PORTABLE CHEST 1 VIEW COMPARISON:  Chest radiograph performed earlier today at 4:46 a.m. FINDINGS: The patient's endotracheal tube is seen ending 4-5 cm above the carina. An enteric tube is noted extending below the diaphragm. A left IJ line is noted ending about the mid SVC. The lungs are mildly hypoexpanded. Mild vascular congestion is noted. Mild left basilar airspace opacity may reflect atelectasis or possibly mild pneumonia. No pleural effusion or pneumothorax is seen. The cardiomediastinal silhouette is normal in size. No acute osseous abnormalities are seen. IMPRESSION: 1. Endotracheal tube seen ending 4-5 cm above the carina. 2. Lungs mildly hypoexpanded. Mild vascular congestion noted. Mild left basilar airspace opacity may reflect atelectasis or possibly mild pneumonia. Electronically Signed   By: Roanna RaiderJeffery  Chang M.D.   On: 01/30/2015 07:05   Dg Chest Port 1 View  01/29/2015  CLINICAL DATA:  44 year old male with endotracheal tube replacement. EXAM: PORTABLE CHEST 1 VIEW COMPARISON:  01/29/2015 and prior exam FINDINGS: An endotracheal tube is present with tip 6 cm above the carina. Left IJ central venous catheter is again noted with tip overlying the upper SVC. An NG tube is present cold in the proximal stomach. This is a low volume film with left basilar atelectasis again noted. There is no evidence of pneumothorax. The cardiomediastinal silhouette is unchanged.  IMPRESSION: Endotracheal tube with tip 6 cm above the carina. Low volume film with continued left basilar atelectasis. Electronically Signed   By: Harmon PierJeffrey  Hu M.D.   On: 01/29/2015 21:59   ASSESSMENT / PLAN:  PULMONARY OETT 10/15 >> Acute respiratory failure - post cardiac arrest  Patient has multiple ribs dislocated from his sternum and every time he is placed on a wean internal rebreathing is noted. P:   PCV for asynchrony concerns, PEEP to 5 and FiO2 to 40%  PSV as tolerated  ?extubation due to mental status, good mechanics with weaning but not safe with current mental status Intermittent CXR / ABG Daily weaning but no plan for extubation yet.   Family discussing idea of trach/PEG if fails extubation  CARDIOVASCULAR CVL L IJ 10/16 >> Cardiac arrest - PEA  STEMI  RCA occlusion - s/p stenting 10/15 P:  Anticoagulation per cards. ASA. Normothermia protocol complete. Hold home lisinopril, tricor, HCTZ.  Continue Coreg 25 BID  RENAL Hypo K, Mg and Phos. P:   Trend BMP / UOP  KVO IVF. Replace  electrolytes as indicated. Lasix 40 mg BID  KCL 20 mEq QD Free water 250 q6.  GASTROINTESTINAL No active issue  P:   PPI. Nutrition for TF  HEMATOLOGIC No active issue  P:  Anticoagulation per cards. F/u CBC. SQ heparin.  INFECTIOUS UTI  Fever - tmax 101.5 10/22 P:   Urine culture 10/16 >> negative  Sputum 10/22 >>  [facility] 10/22 >>   Rocephin 10/16 >> 10/20 Vanco 10/22 >>  Zosyn 10/22 >>   Follow fever curve / WBC  Assess PCT   ENDOCRINE Hx DM  P:   Change to resistant scale  Increase Lantus to 40 units QD 10/22. Hold home metformin.  NEUROLOGIC AMS / Hypoxic Encephalopathy  - post cardiac arrest  Fall - head CT neg  Shivering - resolved  Patient was following commands on 10/18, 10/19 was very much sedated and not following commands, no 10/20 patient is off sedation and is not arousable or following command. P:   RASS goal: 0 . MRI with ?CVA associated  with anoxic injury from arrest. Neurology feels prolonged rehab will be needed. Neurology consult called to Dr. Roseanne Reno, appreciate input. Will need to clear c-spine once awake. Continue C-Collar   FAMILY  - Updates: Brother, Brett Canales, updated 10/22 and 10/23 on plan of care.    GLOBAL: Will consult SW to assist brother with obtaining "temporary POA" so he can help manage his bills / home needs.    Canary Brim, NP-C [facility] Pgr: [phone] or if no answer [phone] 01/30/2015, 10:04 AM

## 2015-01-30 NOTE — Progress Notes (Signed)
ANTIBIOTIC CONSULT NOTE - FOLLOW UP  Pharmacy Consult for vancomycin Indication: pneumonia  No Known Allergies  Patient Measurements: Height:  (188 cm) Weight: 241 lb 13.5 oz (109.7 kg) IBW/kg (Calculated) : 82.2 Adjusted Body Weight:   Vital Signs: Temp: 98.3 F (36.8 C) (10/23 1615) Temp Source: Core (Comment) (10/23 1615) BP: 127/84 mmHg (10/23 1615) Pulse Rate: 100 (10/23 1615) Intake/Output from previous day: 10/22 0701 - 10/23 0700 In: 2640 [NG/GT:1790; IV Piggyback:850] Out: 2425 [Urine:2425] Intake/Output from this shift: Total I/O In: 1310 [NG/GT:1060; IV Piggyback:250] Out: 1600 [Urine:1600]  Labs:  Recent Labs  01/28/15 0355 01/29/15 0425 01/30/15 0330  WBC 8.6 8.5 9.3  HGB 12.0* 12.5* 12.3*  PLT 260 279 311  CREATININE 0.84 0.85 1.02   Estimated Creatinine Clearance: 124.4 mL/min (by C-G formula based on Cr of 1.02).  Recent Labs  01/30/15 1409  VANCOTROUGH 24*     Microbiology: Recent Results (from the past 720 hour(s))  MRSA PCR Screening     Status: None   Collection Time: 01/22/15  2:15 PM  Result Value Ref Range Status   MRSA by PCR NEGATIVE NEGATIVE Final    Comment:        The GeneXpert MRSA Assay (FDA approved for NASAL specimens only), is one component of a comprehensive MRSA colonization surveillance program. It is not intended to diagnose MRSA infection nor to guide or monitor treatment for MRSA infections.   Culture, Urine     Status: None   Collection Time: 01/23/15  8:11 PM  Result Value Ref Range Status   Specimen Description URINE, CATHETERIZED  Final   Special Requests NONE  Final   Culture NO GROWTH 1 DAY  Final   Report Status 01/24/2015 FINAL  Final  Culture, blood (routine x 2)     Status: None (Preliminary result)   Collection Time: 01/29/15  3:45 PM  Result Value Ref Range Status   Specimen Description BLOOD BLOOD LEFT ARM  Final   Special Requests BOTTLES DRAWN AEROBIC AND ANAEROBIC 10CC  Final   Culture NO GROWTH < 24 HOURS  Final   Report Status PENDING  Incomplete  Culture, blood (routine x 2)     Status: None (Preliminary result)   Collection Time: 01/29/15  4:00 PM  Result Value Ref Range Status   Specimen Description BLOOD BLOOD LEFT ARM  Final   Special Requests BOTTLES DRAWN AEROBIC AND ANAEROBIC 5CC  Final   Culture NO GROWTH < 24 HOURS  Final   Report Status PENDING  Incomplete  Culture, Urine     Status: None   Collection Time: 01/29/15  4:29 PM  Result Value Ref Range Status   Specimen Description URINE, CATHETERIZED  Final   Special Requests NONE  Final   Culture NO GROWTH 1 DAY  Final   Report Status 01/30/2015 FINAL  Final    Anti-infectives    Start     Dose/Rate Route Frequency Ordered Stop   01/31/15 0600  vancomycin (VANCOCIN) 1,250 mg in sodium chloride 0.9 % 250 mL IVPB     1,250 mg 166.7 mL/hr over 90 Minutes Intravenous Every 12 hours 01/30/15 1737     01/30/15 0000  vancomycin (VANCOCIN) IVPB 1000 mg/200 mL premix  Status:  Discontinued     1,000 mg 200 mL/hr over 60 Minutes Intravenous Every 8 hours 01/29/15 1520 01/30/15 1737   01/29/15 1530  vancomycin (VANCOCIN) 2,500 mg in sodium chloride 0.9 % 500 mL IVPB     2,500  mg 250 mL/hr over 120 Minutes Intravenous  Once 01/29/15 1520 01/29/15 1801   01/29/15 1530  piperacillin-tazobactam (ZOSYN) IVPB 3.375 g     3.375 g 12.5 mL/hr over 240 Minutes Intravenous 3 times per day 01/29/15 1520     01/23/15 1100  cefTRIAXone (ROCEPHIN) 1 g in dextrose 5 % 50 mL IVPB     1 g 100 mL/hr over 30 Minutes Intravenous Every 24 hours 01/23/15 1005 01/27/15 1210      Assessment: 44 yo male with pneumonia is currently on supratherapeutic vancomycin.  Vancomycin trough is 24 which is before the 3rd dose. SCr rising at 1.02  Goal of Therapy:  Vancomycin trough level 15-20 mcg/ml  Plan:  - d/c standing vancomycin order for now - recheck vancomycin random tom at 0500 - if vancomycin level is <20, can continue  vancomycin 1250mg  iv q12h.  If not, will reassess.   Stephan Nelis, Tsz-Yin 01/30/2015,5:38 PM

## 2015-01-30 NOTE — Procedures (Signed)
ET tube adanced from 20 to 25 at the lip, patient has bilateral breath sounds and good volumes. RT will continue to monitor.

## 2015-01-30 NOTE — Progress Notes (Signed)
Subjective: Patient was alert today and actively tracts visually. Intermittently follows simple commands. Remains intubated but is currently being weaned from the ventilator.  Objective: Current vital signs: BP 126/86 mmHg  Pulse 96  Temp(Src) 100 F (37.8 C) (Core (Comment))  Resp 20  Ht 6\' 2"  (1.88 m)  Wt 109.7 kg (241 lb 13.5 oz)  BMI 31.04 kg/m2  SpO2 100%  Neurologic Exam: Patient was alert and in no acute distress. He readily tract visually toward verbal stimulation. He was able to follow simple commands with bilateral UE (raise arms) and LE (wiggle toes) though this was not always consistent.   Medications: I have reviewed the patient's current medications.  Assessment/Plan: 44 year old man with hypoxic brain injury secondary to cardiac arrest on 01/22/2015. Patient has regained consciousness, but has severe diffuse impairment, and will require extensive and probably protracted rehabilitation intervention.  Recommend no changes in current management. We will continue to follow as needed.   Elspeth Choeter Jaleyah Longhi, DO [facility] [phone]  If 7pm- 7am, please page neurology on call as listed in AMION.   01/30/2015  7:31 AM

## 2015-01-30 NOTE — Progress Notes (Signed)
PROGRESS NOTE  Subjective:   44 yo man, no previos hx of CAD Admitted 10/15 with cardiac arrest.  Cath showed no significant disease in the LM, LAD, Lcx. RCA had a thrombotic lesion .   Had thrombectomy and stenting .  Had the [place] sun cooling protocol.  Off sedation.  Opens eyes,  Blinks .    Echo  is basically normal  - mild diastolic dysfunction. Normal EF -65%  Continues to improve from a mental status   Objective:    Vital Signs:   Temp:  [99.6 F (37.6 C)-100.1 F (37.8 C)] 100.1 F (37.8 C) (10/23 0759) Pulse Rate:  [81-101] 101 (10/23 0834) Resp:  [14-27] 24 (10/23 0834) BP: (98-146)/(58-86) 118/79 mmHg (10/23 0834) SpO2:  [98 %-100 %] 100 % (10/23 0834) FiO2 (%):  [40 %] 40 % (10/23 0834) Weight:  [241 lb 13.5 oz (109.7 kg)] 241 lb 13.5 oz (109.7 kg) (10/23 0400)  Last BM Date: 01/28/15   24-hour weight change: Weight change: -9 lb 7.7 oz (-4.3 kg)  Weight trends: Filed Weights   01/28/15 0500 01/29/15 0500 01/30/15 0400  Weight: 258 lb 13.1 oz (117.4 kg) 251 lb 5.2 oz (114 kg) 241 lb 13.5 oz (109.7 kg)    Intake/Output:  10/22 0701 - 10/23 0700 In: 2640 [NG/GT:1790; IV Piggyback:850] Out: 2425 [Urine:2425] Total I/O In: 40 [NG/GT:40] Out: -    Physical Exam: BP 118/79 mmHg  Pulse 101  Temp(Src) 100.1 F (37.8 C) (Core (Comment))  Resp 24  Ht 6\' 2"  (1.88 m)  Wt 241 lb 13.5 oz (109.7 kg)  BMI 31.04 kg/m2  SpO2 100%  Wt Readings from Last 3 Encounters:  01/30/15 241 lb 13.5 oz (109.7 kg)  03/05/12 287 lb 6.4 oz (130.364 kg)  05/24/10 276 lb (125.193 kg)    General: Vital signs reviewed and noted. On the vent   Head: Abrasion on back of his head   Eyes: Not assessed.   Throat: ETT in place   Neck:  normal   Lungs:   on vent   Heart:  RR    Abdomen:  Soft, non-tender, non-distended    Extremities: No edema   Neurologic: Moves, opens eyes,   Psych: NA    Labs: BMET:  Recent Labs  01/28/15 0355 01/29/15 0425  01/30/15 0330  NA 144 140 144  K 3.3* 3.6 3.6  CL 103 100* 104  CO2 31 32 32  GLUCOSE 253* 290* 239*  BUN 28* 30* 34*  CREATININE 0.84 0.85 1.02  CALCIUM 8.9 9.0 9.1  MG 2.1 2.2  --   PHOS 3.3 3.0  --     Liver function tests: No results for input(s): AST, ALT, ALKPHOS, BILITOT, PROT, ALBUMIN in the last 72 hours. No results for input(s): LIPASE, AMYLASE in the last 72 hours.  CBC:  Recent Labs  01/29/15 0425 01/30/15 0330  WBC 8.5 9.3  HGB 12.5* 12.3*  HCT 38.8* 37.7*  MCV 89.8 90.2  PLT 279 311    Cardiac Enzymes: No results for input(s): CKTOTAL, CKMB, TROPONINI in the last 72 hours.  Coagulation Studies: No results for input(s): LABPROT, INR in the last 72 hours.  Other: Invalid input(s): POCBNP No results for input(s): DDIMER in the last 72 hours. No results for input(s): HGBA1C in the last 72 hours. No results for input(s): CHOL, HDL, LDLCALC, TRIG, CHOLHDL in the last 72 hours. No results for input(s): TSH, T4TOTAL, T3FREE, THYROIDAB in the last 72  hours.  Invalid input(s): FREET3 No results for input(s): VITAMINB12, FOLATE, FERRITIN, TIBC, IRON, RETICCTPCT in the last 72 hours.   Other results:  Tele  ( personally reviewed )  - sinus tach , occasional PVCs  Medications:    Infusions: . fentaNYL infusion INTRAVENOUS Stopped (01/26/15 2300)    Scheduled Medications: . antiseptic oral rinse  7 mL Mouth Rinse 10 times per day  . aspirin  81 mg Per Tube Daily  . atorvastatin  80 mg Oral q1800  . carvedilol  25 mg Oral BID WC  . chlorhexidine gluconate  15 mL Mouth Rinse BID  . enoxaparin (LOVENOX) injection  40 mg Subcutaneous Q24H  . feeding supplement (PRO-STAT SUGAR FREE 64)  60 mL Per Tube TID  . feeding supplement (VITAL HIGH PROTEIN)  1,000 mL Per Tube Q24H  . free water  250 mL Per Tube Q6H  . furosemide  40 mg Intravenous BID  . insulin aspart  0-20 Units Subcutaneous 6 times per day  . insulin glargine  40 Units Subcutaneous Q24H  .  pantoprazole sodium  40 mg Per Tube Daily  . piperacillin-tazobactam (ZOSYN)  IV  3.375 g Intravenous 3 times per day  . potassium chloride  20 mEq Per Tube Daily  . sodium chloride  3 mL Intravenous Q12H  . ticagrelor  90 mg Per Tube BID  . vancomycin  1,000 mg Intravenous Q8H    Assessment/ Plan:   Active Problems:   Cardiac arrest (HCC)   Acute MI, inferolateral wall, initial episode of care (HCC)   Respiratory failure (HCC)   Anoxic encephalopathy (HCC)   Acute respiratory failure with hypoxia (HCC)   Acute ST elevation myocardial infarction (STEMI) (HCC)   Acute respiratory failure (HCC)  1.  CAD - s/p thrombectomy and stenting of the RCA No significant lesion on the left side.  Continue DAPT for 1 year Coreg was increased  to 25  BID  HR continues to be elevated.  HR and BP are well controlled.  Continue current meds.    2. Cardiac arrest.  Further management per PCCM.   still no purposeful movement  Further management per PCCM   3. Hyperlipidemia:  Continue atorvastatin   4. DM :  Continue insulin therapy .   5. Post arrest encephalopathy:  Not waking up yet . Further eval and management per neurology .   No active cardiac issues. Continue current meds Will sign off. Call for questions   Disposition:  Length of Stay: 8  Vesta Mixer, Montez Hageman., MD, [facility] 01/30/2015, 9:40 AM Office [phone] Pager [phone]

## 2015-01-31 ENCOUNTER — Inpatient Hospital Stay (HOSPITAL_COMMUNITY): Payer: Managed Care, Other (non HMO)

## 2015-01-31 DIAGNOSIS — L8990 Pressure ulcer of unspecified site, unspecified stage: Secondary | ICD-10-CM | POA: Diagnosis present

## 2015-01-31 LAB — POCT I-STAT 3, ART BLOOD GAS (G3+)
Acid-Base Excess: 12 mmol/L — ABNORMAL HIGH (ref 0.0–2.0)
Bicarbonate: 36.8 mEq/L — ABNORMAL HIGH (ref 20.0–24.0)
O2 SAT: 95 %
PCO2 ART: 47.1 mmHg — AB (ref 35.0–45.0)
PO2 ART: 71 mmHg — AB (ref 80.0–100.0)
Patient temperature: 98.6
TCO2: 38 mmol/L (ref 0–100)
pH, Arterial: 7.5 — ABNORMAL HIGH (ref 7.350–7.450)

## 2015-01-31 LAB — BASIC METABOLIC PANEL
Anion gap: 11 (ref 5–15)
BUN: 42 mg/dL — ABNORMAL HIGH (ref 6–20)
CALCIUM: 9.3 mg/dL (ref 8.9–10.3)
CO2: 34 mmol/L — ABNORMAL HIGH (ref 22–32)
CREATININE: 1.31 mg/dL — AB (ref 0.61–1.24)
Chloride: 102 mmol/L (ref 101–111)
Glucose, Bld: 260 mg/dL — ABNORMAL HIGH (ref 65–99)
Potassium: 3.5 mmol/L (ref 3.5–5.1)
SODIUM: 147 mmol/L — AB (ref 135–145)

## 2015-01-31 LAB — PROTIME-INR
INR: 1.18 (ref 0.00–1.49)
Prothrombin Time: 15.2 seconds (ref 11.6–15.2)

## 2015-01-31 LAB — URINALYSIS, ROUTINE W REFLEX MICROSCOPIC
Bilirubin Urine: NEGATIVE
GLUCOSE, UA: 100 mg/dL — AB
Hgb urine dipstick: NEGATIVE
Ketones, ur: NEGATIVE mg/dL
LEUKOCYTES UA: NEGATIVE
NITRITE: NEGATIVE
PH: 5 (ref 5.0–8.0)
PROTEIN: NEGATIVE mg/dL
Specific Gravity, Urine: 1.02 (ref 1.005–1.030)
Urobilinogen, UA: 0.2 mg/dL (ref 0.0–1.0)

## 2015-01-31 LAB — CBC
HCT: 37.8 % — ABNORMAL LOW (ref 39.0–52.0)
Hemoglobin: 11.9 g/dL — ABNORMAL LOW (ref 13.0–17.0)
MCH: 28.7 pg (ref 26.0–34.0)
MCHC: 31.5 g/dL (ref 30.0–36.0)
MCV: 91.3 fL (ref 78.0–100.0)
PLATELETS: 350 10*3/uL (ref 150–400)
RBC: 4.14 MIL/uL — ABNORMAL LOW (ref 4.22–5.81)
RDW: 13.8 % (ref 11.5–15.5)
WBC: 9.4 10*3/uL (ref 4.0–10.5)

## 2015-01-31 LAB — VANCOMYCIN, RANDOM: VANCOMYCIN RM: 12 ug/mL

## 2015-01-31 LAB — PHOSPHORUS: PHOSPHORUS: 4.4 mg/dL (ref 2.5–4.6)

## 2015-01-31 LAB — GLUCOSE, CAPILLARY
GLUCOSE-CAPILLARY: 322 mg/dL — AB (ref 65–99)
GLUCOSE-CAPILLARY: 306 mg/dL — AB (ref 65–99)
Glucose-Capillary: 228 mg/dL — ABNORMAL HIGH (ref 65–99)
Glucose-Capillary: 246 mg/dL — ABNORMAL HIGH (ref 65–99)
Glucose-Capillary: 293 mg/dL — ABNORMAL HIGH (ref 65–99)

## 2015-01-31 LAB — CULTURE, RESPIRATORY

## 2015-01-31 LAB — CULTURE, RESPIRATORY W GRAM STAIN

## 2015-01-31 LAB — PROCALCITONIN: Procalcitonin: 0.26 ng/mL

## 2015-01-31 LAB — APTT: APTT: 31 s (ref 24–37)

## 2015-01-31 LAB — MAGNESIUM: MAGNESIUM: 2.6 mg/dL — AB (ref 1.7–2.4)

## 2015-01-31 MED ORDER — INSULIN GLARGINE 100 UNIT/ML ~~LOC~~ SOLN
45.0000 [IU] | SUBCUTANEOUS | Status: DC
  Administered 2015-01-31 – 2015-02-03 (×4): 45 [IU] via SUBCUTANEOUS
  Filled 2015-01-31 (×5): qty 0.45

## 2015-01-31 MED ORDER — INSULIN ASPART 100 UNIT/ML ~~LOC~~ SOLN
3.0000 [IU] | Freq: Three times a day (TID) | SUBCUTANEOUS | Status: DC
  Administered 2015-01-31 – 2015-02-01 (×3): 3 [IU] via SUBCUTANEOUS

## 2015-01-31 MED ORDER — FREE WATER
250.0000 mL | Status: DC
  Administered 2015-01-31 – 2015-02-07 (×36): 250 mL
  Administered 2015-02-08: 60 mL
  Administered 2015-02-08: 250 mL

## 2015-01-31 MED ORDER — VANCOMYCIN HCL 10 G IV SOLR
1250.0000 mg | Freq: Two times a day (BID) | INTRAVENOUS | Status: DC
  Filled 2015-01-31 (×2): qty 1250

## 2015-01-31 NOTE — Progress Notes (Signed)
Inpatient Diabetes Program Recommendations  AACE/ADA: New Consensus Statement on Inpatient Glycemic Control (2015)  Target Ranges:  Prepandial:   less than 140 mg/dL      Peak postprandial:   less than 180 mg/dL (1-2 hours)      Critically ill patients:  140 - 180 mg/dL   Review of Glycemic Control  Diabetes history: DM 2 Outpatient Diabetes medications: Metformin and Glipizide are listed, however, not being taken at home Current orders for Inpatient glycemic control: Lantus 45 uits, Novolog Resistant Q4hrs  Inpatient Diabetes Program Recommendations: Insulin - Tube Feed Coverage: Consider adding Novolog 4 units Q4 as tube feed coverage.  Thanks,   Christena DeemShannon Jaesean Litzau RN, MSN, [facility] Inpatient Diabetes Coordinator Team Pager [phone] (8a-5p)

## 2015-01-31 NOTE — Progress Notes (Signed)
Patient ID: Albert MarylandRicky Jensen, male   DOB: 02/12/1971, 44 y.o.   MRN: [id number]      PROGRESS NOTE  Subjective:   44 yo man, no previos hx of CAD Admitted 10/15 with cardiac arrest.  Cath showed no significant disease in the LM, LAD, Lcx. RCA had a thrombotic lesion .   Had thrombectomy and stenting .  Had the [place] sun cooling protocol.  Off sedation.  On vent does not follow commands  Nurse indicates failing spontaneous breathing trials   Echo  is basically normal  - mild diastolic dysfunction. Normal EF -65%  Continues to improve from a mental status   Objective:    Vital Signs:   Temp:  [98.3 F (36.8 C)-100.2 F (37.9 C)] 98.7 F (37.1 C) (10/24 0400) Pulse Rate:  [93-101] 100 (10/24 0600) Resp:  [16-24] 18 (10/24 0600) BP: (87-130)/(58-87) 111/75 mmHg (10/24 0600) SpO2:  [97 %-100 %] 100 % (10/24 0600) FiO2 (%):  [40 %] 40 % (10/24 0700) Weight:  [108 kg (238 lb 1.6 oz)] 108 kg (238 lb 1.6 oz) (10/24 0406)  Last BM Date: 01/30/15   24-hour weight change: Weight change: -1.7 kg (-3 lb 12 oz)  Weight trends: Filed Weights   01/29/15 0500 01/30/15 0400 01/31/15 0406  Weight: 114 kg (251 lb 5.2 oz) 109.7 kg (241 lb 13.5 oz) 108 kg (238 lb 1.6 oz)    Intake/Output:  10/23 0701 - 10/24 0700 In: 2010 [NG/GT:1660; IV Piggyback:350] Out: 2700 [Urine:2700] Total I/O In: -  Out: 400 [Urine:400]   Physical Exam: BP 111/75 mmHg  Pulse 100  Temp(Src) 98.7 F (37.1 C) (Oral)  Resp 18  Ht 6\' 2"  (1.88 m)  Wt 108 kg (238 lb 1.6 oz)  BMI 30.56 kg/m2  SpO2 100%  Wt Readings from Last 3 Encounters:  01/31/15 108 kg (238 lb 1.6 oz)  03/05/12 130.364 kg (287 lb 6.4 oz)  05/24/10 125.193 kg (276 lb)    General: Vital signs reviewed and noted. On the vent   Head: Abrasion on back of his head   Eyes: Not assessed.   Throat: ETT in place   Neck:  normal   Lungs:   on vent   Heart:  RR    Abdomen:  Soft, non-tender, non-distended  NG tube in place   Extremities:  No edema   Neurologic: Moves, opens eyes,   Psych: NA    Labs: BMET:  Recent Labs  01/29/15 0425 01/30/15 0330  NA 140 144  K 3.6 3.6  CL 100* 104  CO2 32 32  GLUCOSE 290* 239*  BUN 30* 34*  CREATININE 0.85 1.02  CALCIUM 9.0 9.1  MG 2.2  --   PHOS 3.0  --      CBC:  Recent Labs  01/29/15 0425 01/30/15 0330  WBC 8.5 9.3  HGB 12.5* 12.3*  HCT 38.8* 37.7*  MCV 89.8 90.2  PLT 279 311      Other results:  Tele  SR / ST no arrhythmia  01/31/2015    Medications:    Infusions: . fentaNYL infusion INTRAVENOUS Stopped (01/26/15 2300)    Scheduled Medications: . antiseptic oral rinse  7 mL Mouth Rinse 10 times per day  . aspirin  81 mg Per Tube Daily  . atorvastatin  80 mg Oral q1800  . carvedilol  25 mg Oral BID WC  . chlorhexidine gluconate  15 mL Mouth Rinse BID  . enoxaparin (LOVENOX) injection  40 mg Subcutaneous Q24H  .  feeding supplement (PRO-STAT SUGAR FREE 64)  60 mL Per Tube TID  . feeding supplement (VITAL HIGH PROTEIN)  1,000 mL Per Tube Q24H  . free water  250 mL Per Tube Q6H  . furosemide  40 mg Intravenous BID  . insulin aspart  0-20 Units Subcutaneous 6 times per day  . insulin glargine  40 Units Subcutaneous Q24H  . pantoprazole sodium  40 mg Per Tube Daily  . piperacillin-tazobactam (ZOSYN)  IV  3.375 g Intravenous 3 times per day  . potassium chloride  20 mEq Per Tube Daily  . sodium chloride  3 mL Intravenous Q12H  . ticagrelor  90 mg Per Tube BID    Assessment/ Plan:   Active Problems:   Cardiac arrest (HCC)   Acute MI, inferolateral wall, initial episode of care (HCC)   Respiratory failure (HCC)   Anoxic encephalopathy (HCC)   Acute respiratory failure with hypoxia (HCC)   Acute ST elevation myocardial infarction (STEMI) (HCC)   Acute respiratory failure (HCC)  1.  CAD - s/p thrombectomy and stenting of the RCA No significant lesion on the left side.  Continue DAPT for 1 year Coreg was increased  to 25  BID   Continue  current meds.    2. Cardiac arrest.  Further management per PCCM.   still no purposeful movement  Further management per PCCM   3. Hyperlipidemia:  Continue atorvastatin   4. DM :  Continue insulin therapy .   5. Post arrest encephalopathy:  Not waking up yet . Further eval and management per neurology .   No active cardiac issues. Continue current meds    Disposition:  Length of Stay: [address]

## 2015-01-31 NOTE — Progress Notes (Signed)
eLink Physician-Brief Progress Note Patient Name: Tye MarylandRicky Wares DOB: 03/11/1971 MRN: [id number]   Date of Service  01/31/2015  HPI/Events of Note  Updated brother  eICU Interventions  If not extubated tues will trach wed consented     Intervention Category Minor Interventions: Communication with other healthcare providers and/or family  Nelda BucksFEINSTEIN,DANIEL J. 01/31/2015, 7:31 PM

## 2015-01-31 NOTE — Progress Notes (Signed)
PULMONARY / CRITICAL CARE MEDICINE   Name: Albert Jensen MRN: [id number] DOB: 1970-12-04    ADMISSION DATE:  01/22/2015 CONSULTATION DATE:  10/15  REFERRING MD :  Scarlette Calico   CHIEF COMPLAINT:  Cardiac arrest   INITIAL PRESENTATION: 44 y/o male with hx HTN, DM presented 10/15 after witnessed arrest.  Initial rhythm PEA on EMS arrival with approx 25 mins CPR before ROSC (15 mins bystander and 10 mins with EMS). EKG concerning for STEMI, after negative head CT he was taken urgently to cath lab and PCCM consulted for vent management/hypothermia protocol.   STUDIES:  CT head/c-spine 10/15 >> neg for acute process ECHO 10/18 >> LV mild dilation, EF 65-70%, no RWMA, normal PA pressure  SIGNIFICANT EVENTS: 10/15  Admit after witnessed cardiac arrest.  To cath lab rca stent  SUBJECTIVE:  agitation , fever low grade  VITAL SIGNS: Temp:  [98.3 F (36.8 C)-100.4 F (38 C)] 100.4 F (38 C) (10/24 0800) Pulse Rate:  [93-100] 100 (10/24 0600) Resp:  [16-24] 18 (10/24 0600) BP: (87-130)/(58-87) 111/75 mmHg (10/24 0600) SpO2:  [97 %-100 %] 100 % (10/24 0600) FiO2 (%):  [40 %] 40 % (10/24 0800) Weight:  [108 kg (238 lb 1.6 oz)] 108 kg (238 lb 1.6 oz) (10/24 0406)  HEMODYNAMICS:    VENTILATOR SETTINGS: Vent Mode:  [-] PCV FiO2 (%):  [40 %] 40 % Set Rate:  [12 bmp] 12 bmp PEEP:  [5 cmH20] 5 cmH20 Pressure Support:  [5 cmH20] 5 cmH20 Plateau Pressure:  [15 cmH20-17 cmH20] 17 cmH20 INTAKE / OUTPUT:  Intake/Output Summary (Last 24 hours) at 01/31/15 0912 Last data filed at 01/31/15 0800  Gross per 24 hour  Intake   1630 ml  Output   3100 ml  Net  -1470 ml   PHYSICAL EXAMINATION: General:  wdwn male, appears older than stated age Neuro:  Alert, tracks, intermittently follows commands slow simple HEENT:  PERRL, mm moist, ETT,  C-collar in place. Cardiovascular:  RRR, Nl S1/S2, -M Lungs:  Coarse BS Abdomen:  Round, soft, hypoactive bs  Musculoskeletal:  Warm and dry, multiple large  areas plaque psoriasis unchanged  LABS:  CBC  Recent Labs Lab 01/28/15 0355 01/29/15 0425 01/30/15 0330  WBC 8.6 8.5 9.3  HGB 12.0* 12.5* 12.3*  HCT 37.2* 38.8* 37.7*  PLT 260 279 311   Coag's No results for input(s): APTT, INR in the last 168 hours. BMET  Recent Labs Lab 01/28/15 0355 01/29/15 0425 01/30/15 0330  NA 144 140 144  K 3.3* 3.6 3.6  CL 103 100* 104  CO2 31 32 32  BUN 28* 30* 34*  CREATININE 0.84 0.85 1.02  GLUCOSE 253* 290* 239*   Electrolytes  Recent Labs Lab 01/27/15 0450 01/28/15 0355 01/29/15 0425 01/30/15 0330  CALCIUM 9.4 8.9 9.0 9.1  MG 1.7 2.1 2.2  --   PHOS 1.1* 3.3 3.0  --    Sepsis Markers  Recent Labs Lab 01/29/15 1550 01/30/15 0330  PROCALCITON 0.34 0.30     ABG  Recent Labs Lab 01/27/15 0258 01/28/15 0405 01/29/15 0508  PHART 7.501* 7.469* 7.497*  PCO2ART 35.5 40.5 44.5  PO2ART 68.6* 92.4 70.0*   Liver Enzymes No results for input(s): AST, ALT, ALKPHOS, BILITOT, ALBUMIN in the last 168 hours.   Cardiac Enzymes No results for input(s): TROPONINI, PROBNP in the last 168 hours. Glucose  Recent Labs Lab 01/30/15 0332 01/30/15 0728 01/30/15 1225 01/30/15 1636 01/30/15 2011 01/30/15 2314  GLUCAP 224* 219* 311* 284* 303*  209*   Imaging Dg Chest Port 1 View  01/30/2015  CLINICAL DATA:  Recent endotracheal tube advancement EXAM: PORTABLE CHEST - 1 VIEW COMPARISON:  01/30/2015 FINDINGS: Cardiac shadow is stable. Endotracheal tube now all is seen at 4.2 cm above the carina and in satisfactory position. A nasogastric catheter is noted in the stomach. A left jugular central line is again seen and stable. No new focal infiltrate or sizable effusion is noted. Minimal left basilar atelectasis remains. IMPRESSION: Endotracheal to further within the trachea as described. Persistent left basilar atelectasis. Electronically Signed   By: Inez Catalina M.D.   On: 01/30/2015 19:37   Dg Chest Port 1 View  01/30/2015  CLINICAL  DATA:  Acute respiratory failure EXAM: PORTABLE CHEST 1 VIEW COMPARISON:  01/30/2015 FINDINGS: Endotracheal tube terminates 6.5 cm above the carina. Mild left basilar atelectasis. No focal consolidation. No pleural effusion or pneumothorax. The heart is normal in size. IMPRESSION: Endotracheal tube terminates 6.5 cm above the carina. Electronically Signed   By: Julian Hy M.D.   On: 01/30/2015 13:02   ASSESSMENT / PLAN:  PULMONARY OETT 10/15 >> Acute respiratory failure - post cardiac arrest  Patient has multiple ribs dislocated from his sternum and every time he is placed on a wean internal rebreathing is noted. P:   Remains on PC, last ABg alk, consider PC reduction 12, repeat abg Wean cpap 5ps 5, goal 2 hr Consider trach given neuro status , airway protection skills and almost 2 weeks  Repeat pcxr Dc lasix, see renal  CARDIOVASCULAR CVL L IJ 10/16 >> Cardiac arrest - PEA  STEMI  RCA occlusion - s/p stenting 10/15 P:  ASA, brilinta Hold home lisinopril, tricor, HCTZ further Continue Coreg 25 BID  RENAL ARF, r/o overdiuresis, hypernatremia, hypovolemia. P:   bmet in am  Dc lasix Free water, change to q4h  GASTROINTESTINAL No active issue  P:   PPI. Nutrition for TF  HEMATOLOGIC No active issue  P:  F/u CBC. lovenox but follow crt in am  INFECTIOUS UTI  Fever - imrpoved P:   Urine culture 10/16 >> negative  Sputum 10/22 >>rare candida  BC 10/22 >>   Rocephin 10/16 >> 10/20 Vanco 10/22 >> 10/24 Zosyn 10/22 >>   Dc line neck Send UA Dc vanc  ENDOCRINE Hx DM  uncontrolled P:   Change to resistant scale  Increase Lantus to 45 units Likely to need Tf coverage  NEUROLOGIC AMS / Hypoxic Encephalopathy  - post cardiac arrest  Fall - head CT neg  Shivering - resolved  Patient was following commands on 10/18, now delirium  P:   RASS goal: 0 . Continue C-Collar until able to clear clinically Consider addition Risperdal   FAMILY  - Updates:  Brother, Richardson Landry, updated 10/22 and 10/23 on plan of care.    GLOBAL: Will consult SW to assist brother with obtaining "temporary POA" so he can help manage his bills / home needs.    Ccm time 30 min   Lavon Paganini. Titus Mould, MD, [place] Pgr: [facility]

## 2015-01-31 NOTE — Progress Notes (Signed)
ANTIBIOTIC CONSULT NOTE - FOLLOW UP  Pharmacy Consult for vancomycin Indication: pneumonia  No Known Allergies  Patient Measurements: Height:  (188 cm) Weight: 238 lb 1.6 oz (108 kg) IBW/kg (Calculated) : 82.2 Adjusted Body Weight:   Vital Signs: Temp: 100.2 F (37.9 C) (10/24 1250) Temp Source: Oral (10/24 1250) BP: 104/66 mmHg (10/24 1200) Pulse Rate: 99 (10/24 1200) Intake/Output from previous day: 10/23 0701 - 10/24 0700 In: 2010 [NG/GT:1660; IV Piggyback:350] Out: 2700 [Urine:2700] Intake/Output from this shift: Total I/O In: 360 [NG/GT:360] Out: 1050 [Urine:1050]  Labs:  Recent Labs  01/29/15 0425 01/30/15 0330 01/31/15 0425  WBC 8.5 9.3 9.4  HGB 12.5* 12.3* 11.9*  PLT 279 311 350  CREATININE 0.85 1.02 1.31*   Estimated Creatinine Clearance: 96.1 mL/min (by C-G formula based on Cr of 1.31).  Recent Labs  01/30/15 1409 01/31/15 0425  VANCOTROUGH 24*  --   VANCORANDOM  --  12     Microbiology: Recent Results (from the past 720 hour(s))  MRSA PCR Screening     Status: None   Collection Time: 01/22/15  2:15 PM  Result Value Ref Range Status   MRSA by PCR NEGATIVE NEGATIVE Final    Comment:        The GeneXpert MRSA Assay (FDA approved for NASAL specimens only), is one component of a comprehensive MRSA colonization surveillance program. It is not intended to diagnose MRSA infection nor to guide or monitor treatment for MRSA infections.   Culture, Urine     Status: None   Collection Time: 01/23/15  8:11 PM  Result Value Ref Range Status   Specimen Description URINE, CATHETERIZED  Final   Special Requests NONE  Final   Culture NO GROWTH 1 DAY  Final   Report Status 01/24/2015 FINAL  Final  Culture, blood (routine x 2)     Status: None (Preliminary result)   Collection Time: 01/29/15  3:45 PM  Result Value Ref Range Status   Specimen Description BLOOD BLOOD LEFT ARM  Final   Special Requests BOTTLES DRAWN AEROBIC AND ANAEROBIC 10CC   Final   Culture NO GROWTH < 24 HOURS  Final   Report Status PENDING  Incomplete  Culture, blood (routine x 2)     Status: None (Preliminary result)   Collection Time: 01/29/15  4:00 PM  Result Value Ref Range Status   Specimen Description BLOOD BLOOD LEFT ARM  Final   Special Requests BOTTLES DRAWN AEROBIC AND ANAEROBIC 5CC  Final   Culture NO GROWTH < 24 HOURS  Final   Report Status PENDING  Incomplete  Culture, Urine     Status: None   Collection Time: 01/29/15  4:29 PM  Result Value Ref Range Status   Specimen Description URINE, CATHETERIZED  Final   Special Requests NONE  Final   Culture NO GROWTH 1 DAY  Final   Report Status 01/30/2015 FINAL  Final  Culture, respiratory (NON-Expectorated)     Status: None   Collection Time: 01/29/15  4:43 PM  Result Value Ref Range Status   Specimen Description TRACHEAL ASPIRATE  Final   Special Requests NONE  Final   Gram Stain   Final    ABUNDANT WBC PRESENT,BOTH PMN AND MONONUCLEAR FEW SQUAMOUS EPITHELIAL CELLS PRESENT NO ORGANISMS SEEN Performed at [facility]    Culture   Final    RARE CANDIDA ALBICANS Performed at [facility]    Report Status 01/31/2015 FINAL  Final    Anti-infectives  Start     Dose/Rate Route Frequency Ordered Stop   01/31/15 1000  vancomycin (VANCOCIN) 1,250 mg in sodium chloride 0.9 % 250 mL IVPB  Status:  Discontinued     1,250 mg 166.7 mL/hr over 90 Minutes Intravenous Every 12 hours 01/31/15 0917 01/31/15 0925   01/31/15 0600  vancomycin (VANCOCIN) 1,250 mg in sodium chloride 0.9 % 250 mL IVPB  Status:  Discontinued     1,250 mg 166.7 mL/hr over 90 Minutes Intravenous Every 12 hours 01/30/15 1737 01/30/15 1740   01/30/15 0000  vancomycin (VANCOCIN) IVPB 1000 mg/200 mL premix  Status:  Discontinued     1,000 mg 200 mL/hr over 60 Minutes Intravenous Every 8 hours 01/29/15 1520 01/30/15 1737   01/29/15 1530  vancomycin (VANCOCIN) 2,500 mg in sodium chloride 0.9 % 500 mL IVPB     2,500  mg 250 mL/hr over 120 Minutes Intravenous  Once 01/29/15 1520 01/29/15 1801   01/29/15 1530  piperacillin-tazobactam (ZOSYN) IVPB 3.375 g     3.375 g 12.5 mL/hr over 240 Minutes Intravenous 3 times per day 01/29/15 1520     01/23/15 1100  cefTRIAXone (ROCEPHIN) 1 g in dextrose 5 % 50 mL IVPB     1 g 100 mL/hr over 30 Minutes Intravenous Every 24 hours 01/23/15 1005 01/27/15 1210      Assessment: 44 yo male with pneumonia is currently on vancomycin for possible HCAP.  Vancomycin trough last night was 24 with slight bump in Cr 0.8> 1.3 so dose held, vancomycin random level 12 this am - plan on restarting.   CCM d/c vancomycin during rounds.   Goal of Therapy:  Vancomycin trough level 15-20 mcg/ml  Plan:  Change vancomycin 1250mg  q12  - stop vanc per CCM  Leota SauersLisa Fields Oros Pharm.D. CPP, BCPS Clinical Pharmacist [phone] 01/31/2015 1:53 PM

## 2015-02-01 ENCOUNTER — Inpatient Hospital Stay (HOSPITAL_COMMUNITY): Payer: Managed Care, Other (non HMO)

## 2015-02-01 DIAGNOSIS — I2111 ST elevation (STEMI) myocardial infarction involving right coronary artery: Secondary | ICD-10-CM

## 2015-02-01 LAB — BASIC METABOLIC PANEL
Anion gap: 9 (ref 5–15)
BUN: 41 mg/dL — ABNORMAL HIGH (ref 6–20)
CALCIUM: 9 mg/dL (ref 8.9–10.3)
CO2: 35 mmol/L — ABNORMAL HIGH (ref 22–32)
CREATININE: 1.26 mg/dL — AB (ref 0.61–1.24)
Chloride: 99 mmol/L — ABNORMAL LOW (ref 101–111)
Glucose, Bld: 271 mg/dL — ABNORMAL HIGH (ref 65–99)
Potassium: 3.3 mmol/L — ABNORMAL LOW (ref 3.5–5.1)
SODIUM: 143 mmol/L (ref 135–145)

## 2015-02-01 LAB — CBC WITH DIFFERENTIAL/PLATELET
BASOS PCT: 0 %
Basophils Absolute: 0 10*3/uL (ref 0.0–0.1)
EOS ABS: 0.1 10*3/uL (ref 0.0–0.7)
EOS PCT: 1 %
HCT: 39.9 % (ref 39.0–52.0)
HEMOGLOBIN: 12.6 g/dL — AB (ref 13.0–17.0)
Lymphocytes Relative: 15 %
Lymphs Abs: 1.4 10*3/uL (ref 0.7–4.0)
MCH: 28.9 pg (ref 26.0–34.0)
MCHC: 31.6 g/dL (ref 30.0–36.0)
MCV: 91.5 fL (ref 78.0–100.0)
Monocytes Absolute: 0.8 10*3/uL (ref 0.1–1.0)
Monocytes Relative: 9 %
NEUTROS PCT: 75 %
Neutro Abs: 7.3 10*3/uL (ref 1.7–7.7)
PLATELETS: 339 10*3/uL (ref 150–400)
RBC: 4.36 MIL/uL (ref 4.22–5.81)
RDW: 13.7 % (ref 11.5–15.5)
WBC: 9.8 10*3/uL (ref 4.0–10.5)

## 2015-02-01 LAB — GLUCOSE, CAPILLARY
GLUCOSE-CAPILLARY: 215 mg/dL — AB (ref 65–99)
GLUCOSE-CAPILLARY: 221 mg/dL — AB (ref 65–99)
GLUCOSE-CAPILLARY: 276 mg/dL — AB (ref 65–99)
GLUCOSE-CAPILLARY: 351 mg/dL — AB (ref 65–99)
Glucose-Capillary: 235 mg/dL — ABNORMAL HIGH (ref 65–99)
Glucose-Capillary: 178 mg/dL — ABNORMAL HIGH (ref 65–99)

## 2015-02-01 LAB — PHOSPHORUS: PHOSPHORUS: 3 mg/dL (ref 2.5–4.6)

## 2015-02-01 LAB — C DIFFICILE QUICK SCREEN W PCR REFLEX
C Diff antigen: NEGATIVE
C Diff interpretation: NEGATIVE
C Diff toxin: NEGATIVE

## 2015-02-01 MED ORDER — POTASSIUM CHLORIDE 20 MEQ/15ML (10%) PO SOLN
40.0000 meq | Freq: Once | ORAL | Status: AC
  Administered 2015-02-01: 40 meq
  Filled 2015-02-01: qty 30

## 2015-02-01 MED ORDER — INSULIN ASPART 100 UNIT/ML ~~LOC~~ SOLN
4.0000 [IU] | SUBCUTANEOUS | Status: DC
Start: 2015-02-01 — End: 2015-02-03
  Administered 2015-02-01 – 2015-02-03 (×9): 4 [IU] via SUBCUTANEOUS

## 2015-02-01 MED ORDER — POTASSIUM CHLORIDE 20 MEQ/15ML (10%) PO SOLN: 40.0000 meq | Freq: Once | ORAL | Status: DC

## 2015-02-01 NOTE — Progress Notes (Signed)
PULMONARY / CRITICAL CARE MEDICINE   Name: Albert Jensen MRN: [id number] DOB: 10/07/1970    ADMISSION DATE:  01/22/2015 CONSULTATION DATE:  10/15  REFERRING MD :  Isabel CapriceVaranassi   CHIEF COMPLAINT:  Cardiac arrest   INITIAL PRESENTATION: 44 y/o male with hx HTN, DM presented 10/15 after witnessed arrest.  Initial rhythm PEA on EMS arrival with approx 25 mins CPR before ROSC (15 mins bystander and 10 mins with EMS). EKG concerning for STEMI, after negative head CT he was taken urgently to cath lab and PCCM consulted for vent management/hypothermia protocol.   STUDIES:  CT head/c-spine 10/15 >> neg for acute process ECHO 10/18 >> LV mild dilation, EF 65-70%, no RWMA, normal PA pressure  SIGNIFICANT EVENTS: 10/15  Admit after witnessed cardiac arrest.  To cath lab rca stent  SUBJECTIVE:  Temp noted, some increase stool  VITAL SIGNS: Temp:  [100.1 F (37.8 C)-102.2 F (39 C)] 100.1 F (37.8 C) (10/25 0800) Pulse Rate:  [94-105] 94 (10/25 0900) Resp:  [19-30] 24 (10/25 0900) BP: (104-142)/(66-94) 113/76 mmHg (10/25 0900) SpO2:  [96 %-100 %] 100 % (10/25 0900) FiO2 (%):  [30 %-40 %] 30 % (10/25 0800) Weight:  [108.7 kg (239 lb 10.2 oz)] 108.7 kg (239 lb 10.2 oz) (10/25 0200)  HEMODYNAMICS:    VENTILATOR SETTINGS: Vent Mode:  [-] PCV FiO2 (%):  [30 %-40 %] 30 % Set Rate:  [12 bmp] 12 bmp PEEP:  [5 cmH20] 5 cmH20 Pressure Support:  [5 cmH20-8 cmH20] 8 cmH20 Plateau Pressure:  [14 cmH20-15 cmH20] 14 cmH20 INTAKE / OUTPUT:  Intake/Output Summary (Last 24 hours) at 02/01/15 0932 Last data filed at 02/01/15 0900  Gross per 24 hour  Intake   2130 ml  Output   2025 ml  Net    105 ml   PHYSICAL EXAMINATION: General:  wdwn male, appears older than stated age Neuro:  Alert, tracks, fc yes, slow response HEENT:  C-collar in place, denied pain on palpation Cardiovascular:  RRR, Nl S1/S2, -M Lungs:  Coarse BS Abdomen:  Round, soft, hypoactive bs  Musculoskeletal:  Warm and dry,  multiple large areas plaque psoriasis unchanged  LABS:  CBC  Recent Labs Lab 01/30/15 0330 01/31/15 0425 02/01/15 0332  WBC 9.3 9.4 9.8  HGB 12.3* 11.9* 12.6*  HCT 37.7* 37.8* 39.9  PLT 311 350 339   Coag's  Recent Labs Lab 01/31/15 1200  APTT 31  INR 1.18   BMET  Recent Labs Lab 01/30/15 0330 01/31/15 0425 02/01/15 0332  NA 144 147* 143  K 3.6 3.5 3.3*  CL 104 102 99*  CO2 32 34* 35*  BUN 34* 42* 41*  CREATININE 1.02 1.31* 1.26*  GLUCOSE 239* 260* 271*   Electrolytes  Recent Labs Lab 01/28/15 0355 01/29/15 0425 01/30/15 0330 01/31/15 0425 02/01/15 0332  CALCIUM 8.9 9.0 9.1 9.3 9.0  MG 2.1 2.2  --  2.6*  --   PHOS 3.3 3.0  --  4.4 3.0   Sepsis Markers  Recent Labs Lab 01/29/15 1550 01/30/15 0330 01/31/15 0425  PROCALCITON 0.34 0.30 0.26     ABG  Recent Labs Lab 01/28/15 0405 01/29/15 0508 01/31/15 1036  PHART 7.469* 7.497* 7.500*  PCO2ART 40.5 44.5 47.1*  PO2ART 92.4 70.0* 71.0*   Liver Enzymes No results for input(s): AST, ALT, ALKPHOS, BILITOT, ALBUMIN in the last 168 hours.   Cardiac Enzymes No results for input(s): TROPONINI, PROBNP in the last 168 hours. Glucose  Recent Labs Lab 01/31/15 1143 01/31/15  1611 01/31/15 2021 02/01/15 0033 02/01/15 0450 02/01/15 0746  GLUCAP 322* 293* 306* 351* 235* 221*   Imaging Dg Chest Port 1 View  02/01/2015  CLINICAL DATA:  Hypoxia EXAM: PORTABLE CHEST 1 VIEW COMPARISON:  January 31, 2015 FINDINGS: Endotracheal tube tip is 3.9 cm above the carina. Nasogastric tube tip and side port are below the diaphragm. No pneumothorax. There is atelectatic change in the left base region, stable. Lungs elsewhere clear. Heart size and pulmonary vascularity are normal. No adenopathy. IMPRESSION: Tube positions as described without pneumothorax. Left base atelectasis is stable. No new opacity. No change in cardiac silhouette. Electronically Signed   By: Bretta Bang III M.D.   On: 02/01/2015 07:00    Dg Abd Portable 1v  02/01/2015  CLINICAL DATA:  Orogastric tube placement.  Initial encounter. EXAM: PORTABLE ABDOMEN - 1 VIEW COMPARISON:  None. FINDINGS: The patient's orogastric tube is noted ending overlying the antrum of the stomach. The visualized bowel gas pattern is grossly unremarkable, though not fully assessed. No free intra-abdominal air is seen, though evaluation is limited on a single supine view. No acute osseous abnormalities are identified. IMPRESSION: Orogastric tube noted ending overlying the antrum of the stomach. Electronically Signed   By: Roanna Raider M.D.   On: 02/01/2015 05:22   ASSESSMENT / PLAN:  PULMONARY OETT 10/15 >> Acute respiratory failure - post cardiac arrest  Patient has multiple ribs dislocated from his sternum and every time he is placed on a wean internal rebreathing is noted. P:   Wean cpap 5 ps5, goal 30 min  Assess airway protection skills Attempt to clear neck clinically and remove if able If not extubated today, will trach in am  Secretions also are improved  CARDIOVASCULAR CVL L IJ 10/16 >> Cardiac arrest - PEA  STEMI  RCA occlusion - s/p stenting 10/15 P:  ASA, brilinta Hold home lisinopril, tricor, HCTZ further Continue Coreg 25 BID  RENAL ARF, r/o overdiuresis, hypernatremia, hypovolemia, hyopK P:   bmet in am  Free water, q4h k supp  GASTROINTESTINAL No active issue  P:   PPI. Nutrition for TF, hold if weaning well  HEMATOLOGIC DVt prevention P:  F/u CBC. lovenox  INFECTIOUS UTI  Fever  Increase stools, r/;o cdiff P:   Urine culture 10/16 >> negative  Sputum 10/22 >>rare candida  BC 10/22 >>   Rocephin 10/16 >> 10/20 Vanco 10/22 >> 10/24 Zosyn 10/22 >>   Send cdiff UA neg Low threshold empiric cdiff treatment Resend BC if spike further  ENDOCRINE Hx DM  uncontrolled P:   resistant scale  Increase Lantus to 45 units,may need to hold if NPO Add TF coverage to q4h 4 units  NEUROLOGIC AMS /  Hypoxic Encephalopathy  - post cardiac arrest  Fall - head CT neg  Shivering - resolved  Patient was following commands on 10/18, now delirium  P:   RASS goal: 0 . Attempt to clear clinically if no pain on palpation and reliable, can dc  FAMILY  - Updates: Brother, Brett Canales, updated 10/22 and 10/23 on plan of care.  Updated sister in law 10/25  GLOBAL: Will consult SW to assist brother with obtaining "temporary POA" so he can help manage his bills / home needs.    Ccm time 30 min   Mcarthur Rossetti. Tyson Alias, MD, FACP Pgr: [phone] [facility]

## 2015-02-01 NOTE — Procedures (Signed)
Extubation Procedure Note  Patient Details:   Name: Tye MarylandRicky Tubby DOB: 08/13/1970 MRN: [id number]   Airway Documentation: Extubated pt to 4lpm [place] sat 99%, RR 25, HR 100, BP 120/76.  Pt unable to speak or cough upon request.  Able to look around.  No spontaneous cough or cough on command.    Evaluation  O2 sats: stable throughout Complications: No apparent complications Patient did tolerate procedure well. Bilateral Breath Sounds: Clear, Diminished Suctioning: Airway No  [place] CampbellHall, Milus Fritze Lynn 02/01/2015, 10:28 AM

## 2015-02-01 NOTE — Progress Notes (Signed)
Patient ID: Albert Jensen, male   DOB: 07-17-1970, 44 y.o.   MRN: [id number]      PROGRESS NOTE  Subjective:   44 yo man, no previos hx of CAD Admitted 10/15 with cardiac arrest.  Cath showed no significant disease in the LM, LAD, Lcx. RCA had a thrombotic lesion .   Had thrombectomy and stenting .  Had the Artic sun cooling protocol.  Off sedation.  On vent eyes open ? Weaning trial today    Echo  is basically normal  - mild diastolic dysfunction. Normal EF -65%  Continues to improve from a mental status   Objective:    Vital Signs:   Temp:  [100.2 F (37.9 C)-102.2 F (39 C)] 100.9 F (38.3 C) (10/25 0400) Pulse Rate:  [94-105] 99 (10/25 0700) Resp:  [14-30] 23 (10/25 0700) BP: (102-135)/(66-94) 128/88 mmHg (10/25 0700) SpO2:  [96 %-100 %] 97 % (10/25 0700) FiO2 (%):  [30 %-40 %] 30 % (10/25 0336) Weight:  [108.7 kg (239 lb 10.2 oz)] 108.7 kg (239 lb 10.2 oz) (10/25 0200)  Last BM Date: 01/31/15   24-hour weight change: Weight change: 0.7 kg (1 lb 8.7 oz)  Weight trends: Filed Weights   01/30/15 0400 01/31/15 0406 02/01/15 0200  Weight: 109.7 kg (241 lb 13.5 oz) 108 kg (238 lb 1.6 oz) 108.7 kg (239 lb 10.2 oz)    Intake/Output:  10/24 0701 - 10/25 0700 In: 1820 [NG/GT:1720; IV Piggyback:100] Out: 1675 [Urine:1675]     Physical Exam: BP 128/88 mmHg  Pulse 99  Temp(Src) 100.9 F (38.3 C) (Oral)  Resp 23  Ht  (1.88 m)  Wt 108.7 kg (239 lb 10.2 oz)  BMI 30.75 kg/m2  SpO2 97%  Wt Readings from Last 3 Encounters:  02/01/15 108.7 kg (239 lb 10.2 oz)  03/05/12 130.364 kg (287 lb 6.4 oz)  05/24/10 125.193 kg (276 lb)    General: Vital signs reviewed and noted. On the vent   Head: Abrasion on back of his head   Eyes: Not assessed.   Throat: ETT in place   Neck:  normal   Lungs:   on vent   Heart:  RR    Abdomen:  Soft, non-tender, non-distended  NG tube in place   Extremities: No edema   Neurologic: Moves, opens eyes,   Psych: NA     Labs: BMET:  Recent Labs  01/31/15 0425 02/01/15 0332  NA 147* 143  K 3.5 3.3*  CL 102 99*  CO2 34* 35*  GLUCOSE 260* 271*  BUN 42* 41*  CREATININE 1.31* 1.26*  CALCIUM 9.3 9.0  MG 2.6*  --   PHOS 4.4 3.0     CBC:  Recent Labs  01/31/15 0425 02/01/15 0332  WBC 9.4 9.8  NEUTROABS  --  7.3  HGB 11.9* 12.6*  HCT 37.8* 39.9  MCV 91.3 91.5  PLT 350 339      Other results:  Tele  SR / ST no arrhythmia  02/01/2015    Medications:    Infusions: . fentaNYL infusion INTRAVENOUS Stopped (01/26/15 2300)    Scheduled Medications: . antiseptic oral rinse  7 mL Mouth Rinse 10 times per day  . aspirin  81 mg Per Tube Daily  . atorvastatin  80 mg Oral q1800  . carvedilol  25 mg Oral BID WC  . chlorhexidine gluconate  15 mL Mouth Rinse BID  . enoxaparin (LOVENOX) injection  40 mg Subcutaneous Q24H  . feeding supplement (PRO-STAT SUGAR  FREE 64)  60 mL Per Tube TID  . feeding supplement (VITAL HIGH PROTEIN)  1,000 mL Per Tube Q24H  . free water  250 mL Per Tube Q4H  . insulin aspart  0-20 Units Subcutaneous 6 times per day  . insulin aspart  3 Units Subcutaneous TID WC  . insulin glargine  45 Units Subcutaneous Q24H  . pantoprazole sodium  40 mg Per Tube Daily  . piperacillin-tazobactam (ZOSYN)  IV  3.375 g Intravenous 3 times per day  . potassium chloride  20 mEq Per Tube Daily  . sodium chloride  3 mL Intravenous Q12H  . ticagrelor  90 mg Per Tube BID    Assessment/ Plan:   Active Problems:   Cardiac arrest (HCC)   Acute MI, inferolateral wall, initial episode of care (HCC)   Respiratory failure (HCC)   Anoxic encephalopathy (HCC)   Acute respiratory failure with hypoxia (HCC)   Acute ST elevation myocardial infarction (STEMI) (HCC)   Acute respiratory failure (HCC)   Pressure ulcer  1.  CAD - s/p thrombectomy and stenting of the RCA No significant lesion on the left side.  Continue DAPT for 1 year Coreg was increased  to 25  BID   Continue  current meds.    2. Cardiac arrest.  Further management per PCCM.   still no purposeful movement  Further management per PCCM   3. Hyperlipidemia:  Continue atorvastatin   4. DM :  Continue insulin therapy .   5. Post arrest encephalopathy:  . Further eval and management per neurology . Functional status will be  Better elucidated post extubation   No active cardiac issues. Continue current meds    Disposition:  Length of Stay: 10  Charlton HawsPeter Natajah Derderian

## 2015-02-01 NOTE — Progress Notes (Signed)
eLink Physician-Brief Progress Note Patient Name: Albert MarylandRicky Jensen DOB: 11/15/1970 MRN: [id number]   Date of Service  02/01/2015  HPI/Events of Note    eICU Interventions  Hypokalemia, repleted      Intervention Category Intermediate Interventions: Electrolyte abnormality - evaluation and management  Max FickleDouglas McQuaid 02/01/2015, 5:56 AM

## 2015-02-01 NOTE — Progress Notes (Signed)
CSW spoke with patient's brother Albert Jensen via phone today to discuss request from brother for a financial power of attorney for his brother so that he could pay his brother's bills.  Patient is currently on a vent and is not oriented to sign legal documents at this time. Also- CSW explained to brother that the hospital could not facilitate a financial POA even if patient were alert and oriented. CSW suggested that Albert Jensen begin by checking with patient's bank to determine if there was a process in place to address this issue. Also- he should contact patient's creditors and notify them of current situation in hopes that they would take this into consideration with bill collection. Brother states that there are 3 brothers and they are all working together - however- there is no POA as patient is 44 years old.  CSW provided support and offered to assist family with other issues as needed.  Albert Jensen stated he would follow up with bank and creditors.   [name], KentuckyLCSW [phone]

## 2015-02-02 ENCOUNTER — Inpatient Hospital Stay (HOSPITAL_COMMUNITY): Payer: Managed Care, Other (non HMO)

## 2015-02-02 LAB — BASIC METABOLIC PANEL
ANION GAP: 12 (ref 5–15)
BUN: 32 mg/dL — ABNORMAL HIGH (ref 6–20)
CHLORIDE: 103 mmol/L (ref 101–111)
CO2: 32 mmol/L (ref 22–32)
Calcium: 9.3 mg/dL (ref 8.9–10.3)
Creatinine, Ser: 1.15 mg/dL (ref 0.61–1.24)
GFR calc Af Amer: >60 mL/min (ref >60)
Glucose, Bld: 157 mg/dL — ABNORMAL HIGH (ref 65–99)
POTASSIUM: 3.7 mmol/L (ref 3.5–5.1)
SODIUM: 147 mmol/L — AB (ref 135–145)

## 2015-02-02 LAB — CBC
HCT: 41.1 % (ref 39.0–52.0)
HEMOGLOBIN: 12.8 g/dL — AB (ref 13.0–17.0)
MCH: 28.8 pg (ref 26.0–34.0)
MCHC: 31.1 g/dL (ref 30.0–36.0)
MCV: 92.4 fL (ref 78.0–100.0)
PLATELETS: 373 10*3/uL (ref 150–400)
RBC: 4.45 MIL/uL (ref 4.22–5.81)
RDW: 13.6 % (ref 11.5–15.5)
WBC: 9.7 10*3/uL (ref 4.0–10.5)

## 2015-02-02 LAB — GLUCOSE, CAPILLARY
GLUCOSE-CAPILLARY: 241 mg/dL — AB (ref 65–99)
Glucose-Capillary: 141 mg/dL — ABNORMAL HIGH (ref 65–99)
Glucose-Capillary: 152 mg/dL — ABNORMAL HIGH (ref 65–99)
Glucose-Capillary: 169 mg/dL — ABNORMAL HIGH (ref 65–99)
Glucose-Capillary: 188 mg/dL — ABNORMAL HIGH (ref 65–99)
Glucose-Capillary: 223 mg/dL — ABNORMAL HIGH (ref 65–99)

## 2015-02-02 LAB — PHOSPHORUS: Phosphorus: 3 mg/dL (ref 2.5–4.6)

## 2015-02-02 MED ORDER — ANTISEPTIC ORAL RINSE SOLUTION (CORINZ)
7.0000 mL | OROMUCOSAL | Status: DC
  Administered 2015-02-02 – 2015-02-08 (×36): 7 mL via OROMUCOSAL

## 2015-02-02 MED ORDER — ATORVASTATIN CALCIUM 80 MG PO TABS
80.0000 mg | ORAL_TABLET | Freq: Every day | ORAL | Status: DC
  Administered 2015-02-04 – 2015-02-07 (×4): 80 mg
  Filled 2015-02-02 (×5): qty 1

## 2015-02-02 MED ORDER — VITAL HIGH PROTEIN PO LIQD
1000.0000 mL | ORAL | Status: DC
  Administered 2015-02-02 (×2)
  Administered 2015-02-03: 1000 mL
  Administered 2015-02-03: 40 mL/h
  Filled 2015-02-02 (×3): qty 1000

## 2015-02-02 MED ORDER — CARVEDILOL 25 MG PO TABS
25.0000 mg | ORAL_TABLET | Freq: Two times a day (BID) | ORAL | Status: DC
  Administered 2015-02-03 – 2015-02-05 (×2): 25 mg
  Filled 2015-02-02 (×3): qty 1

## 2015-02-02 MED ORDER — ACETAMINOPHEN 160 MG/5ML PO SOLN
650.0000 mg | Freq: Four times a day (QID) | ORAL | Status: DC | PRN
  Administered 2015-02-02 – 2015-02-07 (×10): 650 mg via ORAL
  Filled 2015-02-02 (×12): qty 20.3

## 2015-02-02 NOTE — Progress Notes (Signed)
Rehab Admissions Coordinator Note:  Patient was screened by Trish MageLogue, Salvatore Shear M for appropriateness for an Inpatient Acute Rehab Consult.  At this time, we are recommending Inpatient Rehab consult.  Trish MageLogue, Shaden Lacher M 02/02/2015, 2:59 PM  I can be reached at [phone].

## 2015-02-02 NOTE — Evaluation (Signed)
Occupational Therapy Evaluation Patient Details Name: Albert Jensen MRN: [id number] DOB: 04/05/1971 Today's Date: 02/02/2015    History of Present Illness 44 year old man who is brought in by EMS after suffering a cardiac arrest with CPR 15 minutes before EMS arrived--hypoxic encephalopathy. PMHx of HTN and DM   Clinical Impression   This 44 yo male admitted with above presents to acute OT with decreased balance, decreased mobility, acute pain, decreased cognition all affecting his ability to care for himself at an independent level as he was pta. He will benefit from acute OT with follow up on CIR to get to a S level.     Follow Up Recommendations  CIR    Equipment Recommendations   (TBD at next venue)       Precautions / Restrictions Precautions Precautions: Fall Precaution Comments: ribs detatchment from sternum due to CPR Restrictions Weight Bearing Restrictions: No      Mobility Bed Mobility Overal bed mobility: +2 for physical assistance;Needs Assistance Bed Mobility: Supine to Sit;Sit to Supine     Supine to sit: Total assist;+2 for physical assistance Sit to supine: Total assist;+2 for physical assistance   General bed mobility comments: "helicopter" technique with use of pad under pt     Balance Overall balance assessment: Needs assistance Sitting-balance support: No upper extremity supported;Feet supported Sitting balance-Leahy Scale: Zero Sitting balance - Comments: no righting reaction with tendency to lean posteriorly if not supported. While seated EOB we had him reach with alternating Bil UEs to give "5", shake hands, reach out for therapist hand                                    ADL Overall ADL's : Needs assistance/impaired Eating/Feeding: NPO   Grooming: Wash/dry face;Moderate assistance;Sitting (needed A to initiate)   Upper Body Bathing: Maximal assistance;Sitting   Lower Body Bathing: Total assistance;+2 for physical  assistance;Sitting/lateral leans   Upper Body Dressing : Maximal assistance;Sitting   Lower Body Dressing: Total assistance;Sitting/lateral leans                       Vision Additional Comments: Vision needs to be further tested, does not wear glasses          Pertinent Vitals/Pain Pain Assessment: No/denies pain (family did report that pt was complaining earlier that his throat was hurting from prior endo trachial tube)     Hand Dominance Right   Extremity/Trunk Assessment Upper Extremity Assessment Upper Extremity Assessment: Generalized weakness (with slow movements)   Lower Extremity Assessment Lower Extremity Assessment: Defer to PT evaluation       Communication Communication Communication: Expressive difficulties (possibly due in part to sore throat from prior endotrachial tube)   Cognition Arousal/Alertness: Awake/alert Behavior During Therapy: Flat affect Overall Cognitive Status: Impaired/Different from baseline Area of Impairment: Attention;Following commands;Safety/judgement;Problem solving   Current Attention Level: Sustained   Following Commands: Follows one step commands with increased time Safety/Judgement: Decreased awareness of deficits   Problem Solving: Slow processing;Decreased initiation;Difficulty sequencing;Requires verbal cues General Comments: increased cuing with increased time to follow commands              Home Living Family/patient expects to be discharged to:: Private residence Living Arrangements: Parent (father) Available Help at Discharge: Family;Available 24 hours/day (supervision only) Type of Home: House Home Access: Stairs to enter Entergy CorporationEntrance Stairs-Number of Steps: 1   Home Layout: One level  Bathroom Shower/Tub: Industrial/product designer: Handicapped height Bathroom Accessibility: Yes   Home Equipment: None   Additional Comments: Washes cars for [employer]      Prior  Functioning/Environment Level of Independence: Independent             OT Diagnosis: Generalized weakness;Cognitive deficits;Disturbance of vision;Acute pain;Altered mental status   OT Problem List: Decreased strength;Decreased range of motion;Decreased activity tolerance;Impaired balance (sitting and/or standing);Decreased cognition;Decreased safety awareness;Pain;Impaired UE functional use;Obesity;Decreased knowledge of use of DME or AE;Impaired vision/perception   OT Treatment/Interventions: Self-care/ADL training;Therapeutic exercise;DME and/or AE instruction;Cognitive remediation/compensation;Therapeutic activities;Balance training;Patient/family education;Visual/perceptual remediation/compensation    OT Goals(Current goals can be found in the care plan section) Acute Rehab OT Goals Patient Stated Goal: Pt unable (will shake head yes and no with increased time) OT Goal Formulation: With patient/family Time For Goal Achievement: 02/16/15 Potential to Achieve Goals: Good  OT Frequency: Min 2X/week              End of Session    Activity Tolerance: Patient tolerated treatment well Patient left: in bed;with call bell/phone within reach   Time: 1024-1055 OT Time Calculation (min): 31 min Charges:  OT General Charges $OT Visit: 1 Procedure OT Evaluation $Initial OT Evaluation Tier I: 1 Procedure  Evette Georges [phone] 02/02/2015, 11:21 AM

## 2015-02-02 NOTE — Progress Notes (Signed)
PULMONARY / CRITICAL CARE MEDICINE   Name: Albert Jensen MRN: [id number] DOB: Mar 10, 1971    ADMISSION DATE:  01/22/2015 CONSULTATION DATE:  10/15  REFERRING MD :  Isabel Caprice   CHIEF COMPLAINT:  Cardiac arrest   INITIAL PRESENTATION: 44 y/o male with hx HTN, DM presented 10/15 after witnessed arrest.  Initial rhythm PEA on EMS arrival with approx 25 mins CPR before ROSC (15 mins bystander and 10 mins with EMS). EKG concerning for STEMI, after negative head CT he was taken urgently to cath lab and PCCM consulted for vent management/hypothermia protocol.   STUDIES:  CT head/c-spine 10/15 >> neg for acute process ECHO 10/18 >> LV mild dilation, EF 65-70%, no RWMA, normal PA pressure  SIGNIFICANT EVENTS: 10/15  Admit after witnessed cardiac arrest.  To cath lab rca stent 10/25 extubated  SUBJECTIVE:extubated, no distress  VITAL SIGNS: Temp:  [99.1 F (37.3 C)-99.8 F (37.7 C)] 99.6 F (37.6 C) (10/26 0730) Pulse Rate:  [100-112] 107 (10/26 1200) Resp:  [19-28] 19 (10/26 1200) BP: (97-147)/(61-114) 121/89 mmHg (10/26 1200) SpO2:  [92 %-100 %] 92 % (10/26 1200) Weight:  [108.5 kg (239 lb 3.2 oz)] 108.5 kg (239 lb 3.2 oz) (10/26 0324)  HEMODYNAMICS:    VENTILATOR SETTINGS:   INTAKE / OUTPUT:  Intake/Output Summary (Last 24 hours) at 02/02/15 1227 Last data filed at 02/02/15 0730  Gross per 24 hour  Intake    150 ml  Output   1751 ml  Net  -1601 ml   PHYSICAL EXAMINATION: General:  wdwn male, appears older than stated age Neuro:  Alert, fc pos, moves ext increased HEENT:  C-collar in place, denied pain on palpation Cardiovascular:  RRR, Nl S1/S2, -M Lungs:  Cta Abdomen:  Round, soft, wnl bs  Musculoskeletal:  Warm and dry, multiple large areas plaque psoriasis unchanged  LABS:  CBC  Recent Labs Lab 01/31/15 0425 02/01/15 0332 02/02/15 0223  WBC 9.4 9.8 9.7  HGB 11.9* 12.6* 12.8*  HCT 37.8* 39.9 41.1  PLT 350 339 373   Coag's  Recent Labs Lab  01/31/15 1200  APTT 31  INR 1.18   BMET  Recent Labs Lab 01/31/15 0425 02/01/15 0332 02/02/15 0223  NA 147* 143 147*  K 3.5 3.3* 3.7  CL 102 99* 103  CO2 34* 35* 32  BUN 42* 41* 32*  CREATININE 1.31* 1.26* 1.15  GLUCOSE 260* 271* 157*   Electrolytes  Recent Labs Lab 01/28/15 0355 01/29/15 0425  01/31/15 0425 02/01/15 0332 02/02/15 0223  CALCIUM 8.9 9.0  < > 9.3 9.0 9.3  MG 2.1 2.2  --  2.6*  --   --   PHOS 3.3 3.0  --  4.4 3.0 3.0  < > = values in this interval not displayed. Sepsis Markers  Recent Labs Lab 01/29/15 1550 01/30/15 0330 01/31/15 0425  PROCALCITON 0.34 0.30 0.26     ABG  Recent Labs Lab 01/28/15 0405 01/29/15 0508 01/31/15 1036  PHART 7.469* 7.497* 7.500*  PCO2ART 40.5 44.5 47.1*  PO2ART 92.4 70.0* 71.0*   Liver Enzymes No results for input(s): AST, ALT, ALKPHOS, BILITOT, ALBUMIN in the last 168 hours.   Cardiac Enzymes No results for input(s): TROPONINI, PROBNP in the last 168 hours. Glucose  Recent Labs Lab 02/01/15 1159 02/01/15 1645 02/01/15 2003 02/02/15 0036 02/02/15 0320 02/02/15 0730  GLUCAP 276* 215* 178* 169* 141* 152*   Imaging No results found. ASSESSMENT / PLAN:  PULMONARY OETT 10/15 >> Acute respiratory failure - post cardiac arrest  Patient has multiple ribs dislocated from his sternum and every time he is placed on a wean internal rebreathing is noted. P:   IS as able Pt abbgressive  CARDIOVASCULAR CVL L IJ 10/16 >> Cardiac arrest - PEA  STEMI  RCA occlusion - s/p stenting 10/15 P:  ASA, brilinta Hold home lisinopril, tricor, HCTZ further Continue Coreg 25 BID Keep tele  RENAL ARF, r/o overdiuresis, hypernatremia, hypovolemia, hyopK P:   bmet in am  Free water resume after tube in stomach placed May nee d5w  GASTROINTESTINAL Dysphagia P:   PPI. Requires objective testing Place NGT for meds and start feeds with asp precautions  HEMATOLOGIC DVt prevention P:  F/u CBC. Lovenox,  crt wnl  INFECTIOUS UTI  Fever  Increase stools, r/;o cdiff Unimpressed LLL, seems is ATX P:   Urine culture 10/16 >> negative  Sputum 10/22 >>rare candida  BC 10/22 >>   Rocephin 10/16 >> 10/20 Vanco 10/22 >> 10/24 Zosyn 10/22 >>   Send cdiff - neg Fevers improved, add stop date 2 more days then dc zosyn and observe  ENDOCRINE Hx DM  uncontrolled P:   resistant scale  Increase Lantus to 45 units , restart TF   NEUROLOGIC AMS / Hypoxic Encephalopathy  - post cardiac arrest  Fall - head CT neg  Shivering - resolved  Patient was following commands on 10/18, now delirium  P:   RASS goal: 0 . PAt aggressive OT  FAMILY  - Updates: Brother, Brett CanalesSteve, updated 10/22 and 10/23 on plan of care.  Updated sister in law 10/25  GLOBAL: Will consult SW to assist brother with obtaining "temporary POA" so he can help manage his bills / home needs.    To tele, triad  Mcarthur RossettiDaniel J. Tyson AliasFeinstein, MD, FACP Pgr: [phone] [facility]

## 2015-02-02 NOTE — Evaluation (Signed)
Clinical/Bedside Swallow Evaluation Patient Details  Name: Albert Jensen MRN: [id number] Date of Birth: 02/28/1971  Today's Date: 02/02/2015 Time: SLP Start Time (ACUTE ONLY): 82950834 SLP Stop Time (ACUTE ONLY): 0859 SLP Time Calculation (min) (ACUTE ONLY): 25 min  Past Medical History:  Past Medical History  Diagnosis Date  . Hypertension   . Diabetes mellitus    Past Surgical History:  Past Surgical History  Procedure Laterality Date  . Cardiac catheterization N/A 01/22/2015    Procedure: Left Heart Cath and Coronary Angiography;  Surgeon: Corky CraftsJayadeep S Varanasi, MD;  [facility];  Service: Cardiovascular;  Laterality: N/A;  . Cardiac catheterization N/A 01/22/2015    Procedure: Coronary Stent Intervention;  Surgeon: Corky CraftsJayadeep S Varanasi, MD;  [facility];  Service: Cardiovascular;  Laterality: N/A;  prox rca   HPI:  44 year old man with history of HTN an DM admitted after cardiac arrest and fall with head hitting hard on concrete. CPR aproximately 25 min. Emergent heart cath. Intubated 10/15-10/25. MRI findings are nonspecific, primary concern is for possible hypoxic ischemic injury given the history of prolonged cardiac arrest. CXR Left base atelectasis is stable. No new opacity.   Assessment / Plan / Recommendation Clinical Impression  Pt following commands (delayed), alert with delayed processing. Currently aphonic (10 day intubation), extremely weak throat clear and inability to produce effective cough; attempts throat clear with congested respirations (audible secretions on vocal cords). Oral care provided with trials ice chip and tsp water with delayed cough, loosened secretions. Family present and encouraged to ask pt to cough/throat clear and swallow intermittently throughout today. Pt not ready for objective assessment, will return next date. Consider short term means of nutrition.       Aspiration Risk  Severe    Diet Recommendation NPO    Medication Administration: Via alternative means    Other  Recommendations Oral Care Recommendations: Oral care QID   Follow Up Recommendations       Frequency and Duration min 2x/week  2 weeks   Pertinent Vitals/Pain none    SLP Swallow Goals     Swallow Study Prior Functional Status       General Other Pertinent Information: 44 year old man with history of HTN an DM admitted after cardiac arrest and fall with head hitting hard on concrete. CPR aproximately 25 min. Emergent heart cath. Intubated 10/15-10/25. MRI findings are nonspecific, primary concern is for possible hypoxic ischemic injury given the history of prolonged cardiac arrest. CXR Left base atelectasis is stable. No new opacity. Type of Study: Bedside swallow evaluation Previous Swallow Assessment:  (none) Diet Prior to this Study: NPO Temperature Spikes Noted: Yes Respiratory Status: Supplemental O2 delivered via (comment) History of Recent Intubation: Yes Length of Intubations (days): 10 days Date extubated: 02/01/15 Behavior/Cognition: Alert;Cooperative;Pleasant mood;Confused;Distractible;Requires cueing Oral Cavity - Dentition: Adequate natural dentition/normal for age Self-Feeding Abilities: Total assist Patient Positioning: Upright in bed Baseline Vocal Quality: Aphonic Volitional Cough: Weak;Wet;Congested (unable) Volitional Swallow: Unable to elicit    Oral/Motor/Sensory Function Overall Oral Motor/Sensory Function:  (generalized decreased ROM and strength)   Ice Chips Ice chips: Impaired Presentation: Spoon Oral Phase Impairments: Reduced labial seal;Reduced lingual movement/coordination;Impaired anterior to posterior transit Oral Phase Functional Implications: Prolonged oral transit Pharyngeal Phase Impairments: Suspected delayed Swallow;Decreased hyoid-laryngeal movement;Cough - Delayed (wet respirations)   Thin Liquid Thin Liquid: Impaired Presentation: Spoon Oral Phase Impairments: Reduced  labial seal;Reduced lingual movement/coordination;Impaired anterior to posterior transit Oral Phase Functional Implications: Prolonged oral transit Pharyngeal  Phase Impairments: Suspected  delayed Swallow;Throat Clearing - Delayed (wet respirations)    Nectar Thick Nectar Thick Liquid: Not tested   Honey Thick Honey Thick Liquid: Not tested   Puree Puree: Not tested   Solid   GO    Solid: Not tested       Royce Macadamia 02/02/2015,9:30 AM Breck Coons Lonell Face.Ed [employer] [phone]

## 2015-02-02 NOTE — Evaluation (Signed)
Physical Therapy Evaluation Patient Details Name: Albert MarylandRicky Capo MRN: [id number] DOB: 06/07/1970 Today's Date: 02/02/2015   History of Present Illness  44 year old man who is brought in by EMS after suffering a cardiac arrest with CPR 15 minutes before EMS arrived--hypoxic encephalopathy. PMHx of HTN and DM  Clinical Impression  Pt admitted with above diagnosis. Pt currently with functional limitations due to the deficits listed below (see PT Problem List). Pt sat EOB for 10 minutes with max to total assist.  Pt does shake head to yes and no questions.  Will benefit from Rehab.   Pt will benefit from skilled PT to increase their independence and safety with mobility to allow discharge to the venue listed below.      Follow Up Recommendations CIR;Supervision/Assistance - 24 hour    Equipment Recommendations  Other (comment) (TBA)    Recommendations for Other Services Rehab consult     Precautions / Restrictions Precautions Precautions: Fall Precaution Comments: ribs detatchment from sternum due to CPR Restrictions Weight Bearing Restrictions: No      Mobility  Bed Mobility Overal bed mobility: +2 for physical assistance;Needs Assistance Bed Mobility: Supine to Sit;Sit to Supine     Supine to sit: Total assist;+2 for physical assistance Sit to supine: Total assist;+2 for physical assistance   General bed mobility comments: "helicopter" technique with use of pad under pt  Transfers                    Ambulation/Gait                Stairs            Wheelchair Mobility    Modified Rankin (Stroke Patients Only)       Balance Overall balance assessment: Needs assistance Sitting-balance support: No upper extremity supported;Feet supported Sitting balance-Leahy Scale: Zero Sitting balance - Comments: no righting reaction with tendency to lean posteriorly if not supported. Sat 10 minutes. While seated EOB we had him reach with alternating Bil UEs to  give "5", shake hands, reach out for therapist hand.  Pt also kicked his LEs to command and raised his right knee to command.                                      Pertinent Vitals/Pain Pain Assessment: No/denies pain (Family reports he c/o throat pain earlier today)  VSS    Home Living Family/patient expects to be discharged to:: Private residence Living Arrangements: Parent (father) Available Help at Discharge: Family;Available 24 hours/day (supervision only) Type of Home: House Home Access: Stairs to enter   [employer] Corporation[employer]trance Stairs-Number of Steps: 1 Home Layout: One level Home Equipment: None Additional Comments: Washes cars for [employer]    Prior Function Level of Independence: Independent               Hand Dominance   Dominant Hand: Right    Extremity/Trunk Assessment   Upper Extremity Assessment: Defer to OT evaluation           Lower Extremity Assessment: RLE deficits/detail;LLE deficits/detail RLE Deficits / Details: grossly 2/5 LLE Deficits / Details: grossly 2/5  Cervical / Trunk Assessment: Kyphotic  Communication   Communication: Expressive difficulties (possibly due in part to sore throat from prior endotrachial tube)  Cognition Arousal/Alertness: Awake/alert Behavior During Therapy: Flat affect Overall Cognitive Status: Impaired/Different from baseline Area of Impairment: Attention;Following commands;Safety/judgement;Problem solving   Current Attention  Level: Sustained   Following Commands: Follows one step commands with increased time Safety/Judgement: Decreased awareness of deficits   Problem Solving: Slow processing;Decreased initiation;Difficulty sequencing;Requires verbal cues General Comments: increased cuing with increased time to follow commands    General Comments      Exercises General Exercises - Lower Extremity Long Arc Quad: AROM;Both;5 reps;Seated      Assessment/Plan    PT Assessment Patient needs continued PT  services  PT Diagnosis Generalized weakness   PT Problem List Decreased activity tolerance;Decreased balance;Decreased mobility;Decreased knowledge of use of DME;Decreased safety awareness;Decreased knowledge of precautions  PT Treatment Interventions DME instruction;Gait training;Functional mobility training;Therapeutic activities;Balance training;Therapeutic exercise;Neuromuscular re-education;Cognitive remediation;Patient/family education   PT Goals (Current goals can be found in the Care Plan section) Acute Rehab PT Goals Patient Stated Goal: Pt unable (will shake head yes and no with increased time) PT Goal Formulation: With patient Time For Goal Achievement: 02/16/15 Potential to Achieve Goals: Good    Frequency Min 3X/week   Barriers to discharge        Co-evaluation PT/OT/SLP Co-Evaluation/Treatment: Yes Reason for Co-Treatment: Complexity of the patient's impairments (multi-system involvement) PT goals addressed during session: Mobility/safety with mobility         End of Session Equipment Utilized During Treatment: Oxygen Activity Tolerance: Patient limited by fatigue Patient left: in bed;with call bell/phone within reach;with family/visitor present;with SCD's reapplied Nurse Communication: Mobility status;Need for lift equipment         Time: 4098-1191 PT Time Calculation (min) (ACUTE ONLY): 31 min   Charges:   PT Evaluation $Initial PT Evaluation Tier I: 1 Procedure     PT G CodesBerline Lopes 2015/02/14, 1:11 PM  Berneice Zettlemoyer,PT Acute Rehabilitation [phone] (pager)

## 2015-02-03 ENCOUNTER — Inpatient Hospital Stay (HOSPITAL_COMMUNITY): Payer: Managed Care, Other (non HMO)

## 2015-02-03 DIAGNOSIS — J189 Pneumonia, unspecified organism: Secondary | ICD-10-CM

## 2015-02-03 DIAGNOSIS — D62 Acute posthemorrhagic anemia: Secondary | ICD-10-CM

## 2015-02-03 DIAGNOSIS — E1165 Type 2 diabetes mellitus with hyperglycemia: Secondary | ICD-10-CM

## 2015-02-03 DIAGNOSIS — R1310 Dysphagia, unspecified: Secondary | ICD-10-CM

## 2015-02-03 DIAGNOSIS — E1169 Type 2 diabetes mellitus with other specified complication: Secondary | ICD-10-CM

## 2015-02-03 DIAGNOSIS — IMO0002 Reserved for concepts with insufficient information to code with codable children: Secondary | ICD-10-CM | POA: Diagnosis present

## 2015-02-03 DIAGNOSIS — N390 Urinary tract infection, site not specified: Secondary | ICD-10-CM | POA: Diagnosis present

## 2015-02-03 DIAGNOSIS — G9340 Encephalopathy, unspecified: Secondary | ICD-10-CM | POA: Diagnosis present

## 2015-02-03 DIAGNOSIS — N179 Acute kidney failure, unspecified: Secondary | ICD-10-CM

## 2015-02-03 DIAGNOSIS — R000 Tachycardia, unspecified: Secondary | ICD-10-CM

## 2015-02-03 DIAGNOSIS — I2510 Atherosclerotic heart disease of native coronary artery without angina pectoris: Secondary | ICD-10-CM

## 2015-02-03 DIAGNOSIS — J989 Respiratory disorder, unspecified: Secondary | ICD-10-CM

## 2015-02-03 DIAGNOSIS — G931 Anoxic brain damage, not elsewhere classified: Secondary | ICD-10-CM

## 2015-02-03 DIAGNOSIS — I10 Essential (primary) hypertension: Secondary | ICD-10-CM

## 2015-02-03 DIAGNOSIS — R410 Disorientation, unspecified: Secondary | ICD-10-CM | POA: Diagnosis present

## 2015-02-03 DIAGNOSIS — J9811 Atelectasis: Secondary | ICD-10-CM

## 2015-02-03 LAB — GLUCOSE, CAPILLARY
GLUCOSE-CAPILLARY: 318 mg/dL — AB (ref 65–99)
GLUCOSE-CAPILLARY: 223 mg/dL — AB (ref 65–99)
GLUCOSE-CAPILLARY: 268 mg/dL — AB (ref 65–99)
Glucose-Capillary: 277 mg/dL — ABNORMAL HIGH (ref 65–99)
Glucose-Capillary: 303 mg/dL — ABNORMAL HIGH (ref 65–99)
Glucose-Capillary: 320 mg/dL — ABNORMAL HIGH (ref 65–99)

## 2015-02-03 LAB — BLOOD GAS, ARTERIAL
ACID-BASE EXCESS: 3.6 mmol/L — AB (ref 0.0–2.0)
Acid-Base Excess: 4 mmol/L — ABNORMAL HIGH (ref 0.0–2.0)
BICARBONATE: 27.4 meq/L — AB (ref 20.0–24.0)
Bicarbonate: 26.8 mEq/L — ABNORMAL HIGH (ref 20.0–24.0)
Drawn by: 31101
Drawn by: 365271
FIO2: 1
FIO2: 1
LHR: 14 {breaths}/min
O2 SAT: 98.9 %
O2 Saturation: 84.6 %
PATIENT TEMPERATURE: 101.1
PCO2 ART: 42.6 mmHg (ref 35.0–45.0)
PEEP/CPAP: 10 cmH2O
PH ART: 7.431 (ref 7.350–7.450)
Patient temperature: 98.6
TCO2: 27.7 mmol/L (ref 0–100)
TCO2: 28.6 mmol/L (ref 0–100)
VT: 500 mL
pCO2 arterial: 31.6 mmHg — ABNORMAL LOW (ref 35.0–45.0)
pH, Arterial: 7.538 — ABNORMAL HIGH (ref 7.350–7.450)
pO2, Arterial: 47.7 mmHg — ABNORMAL LOW (ref 80.0–100.0)
pO2, Arterial: 159 mmHg — ABNORMAL HIGH (ref 80.0–100.0)

## 2015-02-03 LAB — BASIC METABOLIC PANEL
Anion gap: 10 (ref 5–15)
BUN: 43 mg/dL — AB (ref 6–20)
CALCIUM: 8.9 mg/dL (ref 8.9–10.3)
CHLORIDE: 104 mmol/L (ref 101–111)
CO2: 28 mmol/L (ref 22–32)
CREATININE: 1.31 mg/dL — AB (ref 0.61–1.24)
GFR calc Af Amer: >60 mL/min (ref >60)
GFR calc non Af Amer: >60 mL/min (ref >60)
GLUCOSE: 353 mg/dL — AB (ref 65–99)
Potassium: 3.8 mmol/L (ref 3.5–5.1)
Sodium: 142 mmol/L (ref 135–145)

## 2015-02-03 LAB — CULTURE, BLOOD (ROUTINE X 2)
CULTURE: NO GROWTH
Culture: NO GROWTH

## 2015-02-03 MED ORDER — MIDAZOLAM HCL 2 MG/2ML IJ SOLN
INTRAMUSCULAR | Status: AC
  Administered 2015-02-03: 2 mg via INTRAVENOUS
  Filled 2015-02-03: qty 2

## 2015-02-03 MED ORDER — DEXMEDETOMIDINE HCL IN NACL 200 MCG/50ML IV SOLN
0.0000 ug/kg/h | INTRAVENOUS | Status: DC
  Administered 2015-02-03 – 2015-02-04 (×2): 0.5 ug/kg/h via INTRAVENOUS
  Filled 2015-02-03 (×3): qty 50

## 2015-02-03 MED ORDER — GLUCERNA 1.2 CAL PO LIQD
1000.0000 mL | ORAL | Status: DC
  Filled 2015-02-03 (×3): qty 1000

## 2015-02-03 MED ORDER — FENTANYL CITRATE (PF) 100 MCG/2ML IJ SOLN
INTRAMUSCULAR | Status: AC
  Administered 2015-02-03: 100 ug
  Filled 2015-02-03: qty 2

## 2015-02-03 MED ORDER — FUROSEMIDE 10 MG/ML IJ SOLN
INTRAMUSCULAR | Status: AC
  Filled 2015-02-03: qty 4

## 2015-02-03 MED ORDER — FENTANYL CITRATE (PF) 100 MCG/2ML IJ SOLN
100.0000 ug | INTRAMUSCULAR | Status: DC | PRN
  Filled 2015-02-03: qty 2

## 2015-02-03 MED ORDER — SODIUM CHLORIDE 0.45 % IV SOLN
INTRAVENOUS | Status: DC
  Administered 2015-02-03: 23:00:00 via INTRAVENOUS

## 2015-02-03 MED ORDER — MIDAZOLAM HCL 2 MG/2ML IJ SOLN
2.0000 mg | INTRAMUSCULAR | Status: DC | PRN
Start: 2015-02-03 — End: 2015-02-07
  Administered 2015-02-04 – 2015-02-05 (×2): 2 mg via INTRAVENOUS
  Filled 2015-02-03: qty 2

## 2015-02-03 MED ORDER — GUAIFENESIN-DM 100-10 MG/5ML PO SYRP: 5.0000 mL | ORAL_SOLUTION | Freq: Two times a day (BID) | ORAL | Status: DC

## 2015-02-03 MED ORDER — DOCUSATE SODIUM 50 MG/5ML PO LIQD: 100.0000 mg | Freq: Two times a day (BID) | ORAL | Status: DC | PRN

## 2015-02-03 MED ORDER — INSULIN GLARGINE 100 UNIT/ML ~~LOC~~ SOLN: 50.0000 [IU] | SUBCUTANEOUS | Status: DC

## 2015-02-03 MED ORDER — MIDAZOLAM HCL 2 MG/2ML IJ SOLN
2.0000 mg | INTRAMUSCULAR | Status: DC | PRN
  Administered 2015-02-03 – 2015-02-04 (×2): 2 mg via INTRAVENOUS
  Filled 2015-02-03: qty 2

## 2015-02-03 MED ORDER — FENTANYL CITRATE (PF) 100 MCG/2ML IJ SOLN
100.0000 ug | INTRAMUSCULAR | Status: DC | PRN
  Administered 2015-02-04: 100 ug via INTRAVENOUS

## 2015-02-03 MED ORDER — CHLORHEXIDINE GLUCONATE 0.12% ORAL RINSE (MEDLINE KIT)
15.0000 mL | Freq: Two times a day (BID) | OROMUCOSAL | Status: DC
Start: 2015-02-03 — End: 2015-02-08
  Administered 2015-02-03 – 2015-02-07 (×9): 15 mL via OROMUCOSAL

## 2015-02-03 NOTE — Care Management Note (Signed)
Case Management Note  Patient Details  Name: Tye MarylandRicky Eisman MRN: [id number] Date of Birth: 02/05/1971  Subjective/Objective:          Monia [employer] - Victorino DikeJennifer called - Has gotten clinicals - is available for discharge needs - phone number [phone], fax [phone].  Stated didn't have results of cath with her 21 pages that she received.  Resent the latest progress note with cath results.           Action/Plan:   Expected Discharge Date:                  Expected Discharge Plan:     In-House Referral:     Discharge planning Services  CM Consult, Indigent Health Clinic, Medication Assistance  Post Acute Care Choice:    Choice offered to:     DME Arranged:    DME Agency:     HH Arranged:    HH Agency:     Status of Service:   In process, will continue to monitor  Medicare Important Message Given:    Date Medicare IM Given:    Medicare IM give by:    Date Additional Medicare IM Given:    Additional Medicare Important Message give by:     If discussed at Long Length of Stay Meetings, dates discussed:    Additional Comments: 02/03/2015  CM assessed pt, pt is non verbal and was unable to communicate with CM.  CM consulted with [facility] and [facility].  CM was informed by [facility] liaison that pt would need to pay remainder of $5000 copay then 40% copay (approximately $17,000) because pt is not in network.  CM spoke with pts brother via phone and [facility], pt agreed to speak with [facility] liaison.  CM was informed by [facility] that facility is within network, agency will contact brother and sister in law to discuss possible discharge plan.  CIR recommended HH or LTAC, pt is currently on 6L [place], tube feeds, and IV antibiotics.  Cherylann ParrClaxton, Breniya Goertzen S, RN 02/03/2015, 3:19 PM

## 2015-02-03 NOTE — Consult Note (Signed)
Physical Medicine and Rehabilitation Consult Reason for Consult: Hypoxic brain injury Referring Physician: [facility]   HPI: Berdell Hostetler is a 44 y.o. right handed male with history of hypertension, diabetes mellitus and remote tobacco abuse. Independent prior to admission living with father one level home no steps to entry. History taken from chart review.  Presented 01/22/2015 after witnessed cardiac arrest with CPR 15 minutes before EMS arrived. He was defibrillated twice per EMS and had restoration of sinus rhythm. Initial EKG showed significant ST elevations laterally with inferior ST depressions. Cranial CT scan negative. Patient was intubated. Troponin in the ED 0.10. Underwent cardiac catheterization RCA showed mid RCA 99% lesion. Underwent thrombectomy, PCI/Synergy stent. Placed on Brilinta as well as aspirin. Echocardiogram with ejection fraction of 70% grade 1 diastolic dysfunction. No wall motion abnormalities. Subcutaneous Lovenox for DVT prophylaxis. Hospital course altered mental status after cardiac arrest with neurology services consulted. EEG showed no seizure activity suspect hypoxic encephalopathy. Currently maintained on Zosyn for suspect HCAP. Extubated 02/02/2015. Currently nothing by mouth with nasogastric tube feeds. Physical and occupational therapy evaluations completed 02/02/2015 with recommendations of physical medicine rehabilitation consult  Review of Systems  Unable to perform ROS: mental acuity   Past Medical History  Diagnosis Date  . Hypertension   . Diabetes mellitus    Past Surgical History  Procedure Laterality Date  . Cardiac catheterization N/A 01/22/2015    Procedure: Left Heart Cath and Coronary Angiography;  Surgeon: Corky Crafts, MD;  Location[facility] INVASIVE CV LAB;  Service: Cardiovascular;  Laterality: N/A;  . Cardiac catheterization N/A 01/22/2015    Procedure: Coronary Stent Intervention;  Surgeon: Corky Crafts, MD;  Location: [facility]  INVASIVE CV LAB;  Service: Cardiovascular;  Laterality: N/A;  prox rca   Family History  Problem Relation Age of Onset  . Heart disease Mother 52    MI  . Diabetes Father    Social History:  reports that he quit smoking about 9 years ago. He does not have any smokeless tobacco history on file. He reports that he drinks about 0.6 oz of alcohol per week. His drug history is not on file. Allergies: No Known Allergies Medications Prior to Admission  Medication Sig Dispense Refill  . fenofibrate (TRICOR) 48 MG tablet Take 2 tablets (96 mg total) by mouth daily. (Patient not taking: Reported on 01/22/2015) 180 tablet 1  . fenofibrate micronized (ANTARA) 130 MG capsule Take 1 capsule (130 mg total) by mouth daily before breakfast. (Patient not taking: Reported on 01/22/2015) 90 capsule 1  . glipiZIDE (GLUCOTROL) 10 MG tablet Take 1 tablet (10 mg total) by mouth daily. (Patient not taking: Reported on 01/22/2015) 90 tablet 1  . lisinopril (PRINIVIL,ZESTRIL) 10 MG tablet Take 1 tablet (10 mg total) by mouth daily. (Patient not taking: Reported on 01/22/2015) 90 tablet 1  . lisinopril-hydrochlorothiazide (PRINZIDE,ZESTORETIC) 10-12.5 MG per tablet Take 1 tablet by mouth daily. (Patient not taking: Reported on 01/22/2015) 90 tablet 1  . metFORMIN (GLUCOPHAGE) 1000 MG tablet Take 1 tablet (1,000 mg total) by mouth 2 (two) times daily with a meal. 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[employer]  Functional  History: Prior Function Level of Independence: Independent Functional Status:  Mobility: Bed Mobility Overal bed mobility: +2 for physical assistance, Needs Assistance Bed Mobility: Supine to Sit, Sit to Supine Supine to sit: Total assist, +2 for physical assistance Sit to supine: Total assist, +2 for physical assistance General bed mobility comments: "helicopter" technique with use of pad under pt        ADL: ADL Overall ADL's : Needs assistance/impaired Eating/Feeding: NPO Grooming: Wash/dry face, Moderate assistance, Sitting (needed A to initiate) Upper Body Bathing: Maximal assistance, Sitting Lower Body Bathing: Total assistance, +2 for physical assistance, Sitting/lateral leans Upper Body Dressing : Maximal assistance, Sitting Lower Body Dressing: Total assistance, Sitting/lateral leans  Cognition: Cognition Overall Cognitive Status: Impaired/Different from baseline Orientation Level: Disoriented to place, Disoriented to time, Disoriented to situation, Other (comment) (does not talk to communicate orientation) Cognition Arousal/Alertness: Awake/alert Behavior During Therapy: Flat affect Overall Cognitive Status: Impaired/Different from baseline Area of Impairment: Attention, Following commands, Safety/judgement, Problem solving Current Attention Level: Sustained Following Commands: Follows one step commands with increased time Safety/Judgement: Decreased awareness of deficits Problem Solving: Slow processing, Decreased initiation, Difficulty sequencing, Requires verbal cues General Comments: increased cuing with increased time to follow commands  Blood pressure 125/84, pulse 119, temperature 101.2 F (38.4 C), temperature source Oral, resp. rate 26, height 6\' 2"  (1.88 m), weight 108.5 kg (239 lb 3.2 oz), SpO2 93 %. Physical Exam  Vitals reviewed. Constitutional: He appears well-developed and well-nourished.  HENT:  Head: Normocephalic and atraumatic.  Nasogastric  tube in place  Eyes: Conjunctivae and EOM are normal.  Pupils reactive to light  Neck: Normal range of motion. Neck supple. No thyromegaly present.  Cardiovascular: Regular rhythm.   No murmur heard. Tachycardic  Respiratory: Effort normal. No respiratory distress.  Limited inspiratory effort but clear to auscultation  GI: Soft. Bowel sounds are normal. He exhibits no distension.  Genitourinary:  +Foley  Musculoskeletal: He exhibits edema. He exhibits no tenderness.  Unable to assess strength due to lack of cooperation  Neurological: He is alert.  Patient did make eye contact with examiner but easily distracted. He was nonverbal during exam. Did not follow commands. Withdraws to deep stimuli. Neg Homan's b/l Neg clonus b/l DTRs symmetric   Unable to assess CNs  Skin: Skin is warm and dry.  Psychiatric: He is slowed. Cognition and memory are impaired. He is noncommunicative.    Results for orders placed or performed during the hospital encounter of 01/22/15 (from the past 24 hour(s))  Glucose, capillary     Status: Abnormal   Collection Time: 02/02/15  1:27 PM  Result Value Ref Range   Glucose-Capillary 188 (H) 65 - 99 mg/dL   Comment 1 Capillary Specimen   Glucose, capillary     Status: Abnormal   Collection Time: 02/02/15  5:04 PM  Result Value Ref Range   Glucose-Capillary 223 (H) 65 - 99 mg/dL   Comment 1 Capillary Specimen   Glucose, capillary     Status: Abnormal   Collection Time: 02/02/15  7:48 PM  Result Value Ref Range   Glucose-Capillary 241 (H) 65 - 99 mg/dL  Glucose, capillary     Status: Abnormal   Collection Time: 02/03/15 12:40 AM  Result Value Ref Range   Glucose-Capillary 320 (H) 65 - 99 mg/dL  Basic metabolic panel     Status: Abnormal   Collection Time: 02/03/15  3:03 AM  Result Value Ref Range  Sodium 142 135 - 145 mmol/L   Potassium 3.8 3.5 - 5.1 mmol/L   Chloride 104 101 - 111 mmol/L   CO2 28 22 - 32 mmol/L   Glucose, Bld 353 (H) 65 - 99 mg/dL    BUN 43 (H) 6 - 20 mg/dL   Creatinine, Ser 1.61 (H) 0.61 - 1.24 mg/dL   Calcium 8.9 8.9 - 09.6 mg/dL   GFR calc non Af Amer >60 >60 mL/min   GFR calc Af Amer >60 >60 mL/min   Anion gap 10 5 - 15  Glucose, capillary     Status: Abnormal   Collection Time: 02/03/15  4:16 AM  Result Value Ref Range   Glucose-Capillary 318 (H) 65 - 99 mg/dL   Dg Abd 1 View  04/54/0981  CLINICAL DATA:  Nasogastric tube placement EXAM: ABDOMEN - 1 VIEW COMPARISON:  February 01, 2015 FINDINGS: Nasogastric tube tip and side port are in the distal stomach. Bowel gas pattern unremarkable. Mild atelectasis left lung base. IMPRESSION: Nasogastric tube tip and side port in distal stomach region. Bowel gas pattern unremarkable. Electronically Signed   By: Bretta Bang III M.D.   On: 02/02/2015 15:46    Assessment/Plan: Diagnosis: Hypoxic brain injury Labs and images independently reviewed.  Records reviewed and summated above.  NeuroPsych evaluation for behavorial assessment when appropriate  Provide environmental management by reducing the level of stimulation, tolerating restlessness when possible, protecting patient from harming self or others and reducing patient's cognitive confusion.  Address behavioral concerns include providing structured environments and daily routines in the future  Cognitive therapy to direct modular abilities in order to maintain goals including problem solving, self regulation/monitoring, self management, attention, and memory when appropriate  Fall precautions; pt at risk for second impact syndrome  Prevention of secondary injury: monitor for hypotension, hypoxia, seizures  PT/OT consults for mobility strengthening, endurance training and adaptive ADLs   Consider pharmacological intervention if necessary with neurostimulants,  Such as amantadine, methylphenidate, modafinil, etc.  Consider Propranolol for agitation and storming  Avoid medications that could impair cognitive  abilities, such as anticholinergics, antihistaminic, benzodiazapines, narcotics, etc when possible  1. Does the need for close, 24 hr/day medical supervision in concert with the patient's rehab needs make it unreasonable for this patient to be served in a less intensive setting? Yes 2. Co-Morbidities requiring supervision/potential complications: hypoxic encephalopathy, HTN (monitor and provide prns in accordance with increased physical exertion and pain), DM (Monitor in accordance with exercise and adjust meds as necessary), fevers on abx (Cont abx and monitor), Tachycardia (monitor in accordance with pain and increasing activity), tachypnea, CAD (Cont meds and limit UE resistance exercises), HCAP (Cont abx), AKI (Avoid nephrotoxic meds), ABLA nasogast(transfuse if necessary to ensure appropriate perfusion for increased activity tolerance), Dysphagia (Cont nutrition through NGT) 3. Due to bladder management, safety, skin/wound care, disease management, medication administration, pain management and patient education, does the patient require 24 hr/day rehab nursing? Yes 4. Does the patient require coordinated care of a physician, rehab nurse, PT (1-2 hrs/day, 5 days/week), OT (1-2 hrs/day, 5 days/week) and SLP (1-2 hrs/day, 5 days/week) to address physical and functional deficits in the context of the above medical diagnosis(es)? Yes Addressing deficits in the following areas: balance, endurance, locomotion, strength, transferring, bowel/bladder control, bathing, dressing, feeding, grooming, toileting, cognition, speech, language, swallowing and psychosocial support 5. Can the patient actively participate in an intensive therapy program of at least 3 hrs of therapy per day at least 5 days per week? Not at  present 6. The potential for patient to make measurable gains while on inpatient rehab is Poor to fait at present 7. Anticipated functional outcomes upon discharge from inpatient rehab are mod assist and  max assist  with PT, mod assist and max assist with OT, mod assist and max assist with SLP. 8. Estimated rehab length of stay to reach the above functional goals is: 20-24 days.  9. Does the patient have adequate social supports and living environment to accommodate these discharge functional goals? Potentially 10. Anticipated D/C setting: Other 11. Anticipated post D/C treatments: HH vs. LTAC 12. Overall Rehab/Functional Prognosis: Fair at present  RECOMMENDATIONS: This patient's condition is appropriate for continued rehabilitative care in the following setting: It is unclear at this time the support available from care givers.  If pt has adequate support at home, would consider IRF admission once acute issues have resolved, however, if caregiver support unavailable or limitted, pt would likely need placement in a long term facility, such as LTAC depending on progress.   Patient has agreed to participate in recommended program. Potentially Note that insurance prior authorization may be required for reimbursement for recommended care.  Comment: Rehab Admissions Coordinator to follow up.  Maryla Morrow, MD 02/03/2015

## 2015-02-03 NOTE — Progress Notes (Signed)
Nutrition Brief Note  Telephone order with readback received per Dr. Joseph ArtWoods to D/C large bore NGT and place small bore feeding tube. [facility]. Placement at 100 cm/9510fr. Patient tolerated procedure well. Tube inserted to L nare; tape in place. KUB ordered per RN.   Maureen ChattersKatie Raoul Ciano, RD, LDN Pager #: [phone] After-Hours Pager #: [phone]

## 2015-02-03 NOTE — Progress Notes (Signed)
Called by primary RN to see pt fo decreased sats.  On arrival to the room the pt was siting upright in the bed, alert & able to follow simple commands. RR 24, no acute distress, sats 92% NRB.  Pt is unablt to cough when prompted.  BS very diminished on Lt.  ABG obtained ph 7.5 pO2 43. Placed on Bipap 100% F1O2. PCXR obtained that showed white-out on Lt lung.  Pt began to drop his sats to as low as 58%.  Pt became unresponsive and a code was called.  No loss of pulse & pt became responsive after bagging.  Pt was intubated and tx to [facility].

## 2015-02-03 NOTE — Progress Notes (Signed)
Physical Therapy Treatment Patient Details Name: Tye MarylandRicky Sianez MRN: [id number] DOB: 04/13/1970 Today's Date: 02/03/2015    History of Present Illness 44 year old man who is brought in by EMS after suffering a cardiac arrest with CPR 15 minutes before EMS arrived--hypoxic encephalopathy. PMHx of HTN and DM    PT Comments    Patient progressing slowly towards PT goals. Able to nod appropriately to some questions asked with increased time. Requires total A to maintain static sitting balance. Able to initiate upright posture with manual cues. Pt able to initiate mobility on command with increased time and cues. Fatigues easily. Responds well to family. Difficulty clearing secretions today. Will continue to follow acutely per current POC.   Follow Up Recommendations  CIR;Supervision/Assistance - 24 hour     Equipment Recommendations  Other (comment) (TBA.)    Recommendations for Other Services       Precautions / Restrictions Precautions Precautions: Fall Precaution Comments: ribs detatchment from sternum due to CPR Restrictions Weight Bearing Restrictions: No    Mobility  Bed Mobility Overal bed mobility: Needs Assistance Bed Mobility: Supine to Sit;Sit to Supine     Supine to sit: Total assist;+2 for physical assistance Sit to supine: Total assist;+2 for physical assistance   General bed mobility comments: "helicopter" technique with use of pad under pt  Transfers                    Ambulation/Gait                 Stairs            Wheelchair Mobility    Modified Rankin (Stroke Patients Only)       Balance Overall balance assessment: Needs assistance Sitting-balance support: Feet supported;No upper extremity supported Sitting balance-Leahy Scale: Zero Sitting balance - Comments: No righting reaction with tendency to lean posteriorly and to the left is unsupported. Manual cues to facilitate upright posture and for neck extension. Able to hold  for ~1 minute however fatigues. Able to give a thumbs up on command and initiate waving. Able to kick LEs on command with increased time and assist with initiation. Postural control: Posterior lean                          Cognition Arousal/Alertness: Awake/alert Behavior During Therapy: Flat affect Overall Cognitive Status: Impaired/Different from baseline Area of Impairment: Attention;Following commands;Problem solving;Safety/judgement   Current Attention Level: Sustained   Following Commands: Follows one step commands with increased time Safety/Judgement: Decreased awareness of deficits   Problem Solving: Slow processing;Decreased initiation;Difficulty sequencing General Comments: increased cuing with increased time to follow commands. Able to nod yes to correct birth month and name.    Exercises      General Comments        Pertinent Vitals/Pain Pain Assessment: Faces Faces Pain Scale: No hurt    Home Living                      Prior Function            PT Goals (current goals can now be found in the care plan section) Progress towards PT goals: Progressing toward goals (slowly)    Frequency  Min 3X/week    PT Plan Current plan remains appropriate    Co-evaluation             End of Session   Activity Tolerance: Patient limited by fatigue Patient  left: in bed;with call bell/phone within reach;with bed alarm set;with SCD's reapplied;with family/visitor present     Time: 1130-1153 PT Time Calculation (min) (ACUTE ONLY): 23 min  Charges:  $Therapeutic Activity: 8-22 mins $Neuromuscular Re-education: 8-22 mins                    G Codes:      Jumana Paccione A Tameca Jerez 02/03/2015, 12:09 PM Mylo Red, PT, DPT [phone]

## 2015-02-03 NOTE — Progress Notes (Addendum)
Nutrition Consult/Follow Up  DOCUMENTATION CODES:   Obesity unspecified  INTERVENTION:    D/C Vital High Protein formula   D/C Prostat liquid protein  Initiate Glucerna 1.2 formula at 20 ml/hr and increase by 10 ml every 4 hours to goal rate of 80 ml/hr  TF regimen to provide 2304 kcals, 115 gm protein, 1549 ml of free water  NUTRITION DIAGNOSIS:   Inadequate oral intake related to inability to eat as evidenced by NPO status, ongoing  GOAL:   Patient will meet greater than or equal to 90% of their needs, currently unmet  MONITOR:   TF tolerance, Diet advancement, PO intake, Labs, Weight trends, I & O's  ASSESSMENT:   44yo male with hx HTN, DM presented 10/15 after witnessed arrest.  Patient extubated 10/25.  Transferred to 2W-Cardiac 10/26.  Speech Path recommending continued NPO status.  Large bore NGT discontinued.  Small [facility] feeding [facility]    RD re-consulted for TF initiation & management.  Diet Order:  Diet NPO time specified  Skin:  Wound (see comment) (MASD on sacrum)  Last BM:  10/21  Height:   Ht Readings from Last 1 Encounters:  01/22/15 6\' 2"  (1.88 m)   Weight:   Wt Readings from Last 1 Encounters:  02/02/15 239 lb 3.2 oz (108.5 kg)    CBG (last 3)   Recent Labs  02/03/15 0040 02/03/15 0416 02/03/15 0840  GLUCAP 320* 318* 303*    Ideal Body Weight:  86.3 kg  BMI:  Body mass index is 30.7 kg/(m^2).  Estimated Nutritional Needs:   Kcal:  2200-2400  Protein:  110-120 gm  Fluid:  2.2-2.4 L  EDUCATION NEEDS:   No education needs identified at this time  Maureen ChattersKatie Telesha Deguzman, RD, LDN Pager #: [phone] After-Hours Pager #: [phone]

## 2015-02-03 NOTE — Progress Notes (Signed)
Pt sats lower 80s on 6L. Placed on venturi mask 14 L 55% FiO2. Sats still in lower 80s. Placed on non rebreather. Rapid response paged. MD paged x2. Respiratory paged.  2044- Respiratory & rapid at bedside 2047- Orders placed for blood gass & stat chest xray 2050- Orders placed for bipap & transfer to stepdown 2052-MD returned page and communicated with rapid Pt went into respiratory arrest. Code blue activated Pt intubated & stabilized. 2126- Pt transferred to 2H06

## 2015-02-03 NOTE — Progress Notes (Signed)
Inpatient Diabetes Program Recommendations  AACE/ADA: New Consensus Statement on Inpatient Glycemic Control (2015)  Target Ranges:  Prepandial:   less than 140 mg/dL      Peak postprandial:   less than 180 mg/dL (1-2 hours)      Critically ill patients:  140 - 180 mg/dL   Review of Glycemic Control  Inpatient Diabetes Program Recommendations:  Insulin - Basal: consider increasing Lantus to 54 units  Insulin - Meal Coverage: . Thank you  Piedad ClimesGina Ranferi Clingan BSN, RN,CDE Inpatient Diabetes Coordinator [phone] (team pager)

## 2015-02-03 NOTE — Evaluation (Addendum)
Speech Language Pathology Evaluation Patient Details Name: Albert Jensen MRN: [id number] DOB: 1970-06-17 Today's Date: 02/03/2015 Time: 7829-5621 SLP Time Calculation (min) (ACUTE ONLY): 15 min  Problem List:  Patient Active Problem List   Diagnosis Date Noted  . Pressure ulcer 01/31/2015  . Acute respiratory failure (HCC)   . Anoxic encephalopathy (HCC) 01/25/2015  . Acute respiratory failure with hypoxia (HCC) 01/25/2015  . Acute ST elevation myocardial infarction (STEMI) (HCC)   . Respiratory failure (HCC)   . Cardiac arrest (HCC) 01/22/2015  . Acute MI, inferolateral wall, initial episode of care (HCC)   . ESSENTIAL HYPERTENSION, BENIGN 05/25/2010  . VENTRICULAR TACHYCARDIA, PAROXYSMAL 05/25/2010   Past Medical History:  Past Medical History  Diagnosis Date  . Hypertension   . Diabetes mellitus    Past Surgical History:  Past Surgical History  Procedure Laterality Date  . Cardiac catheterization N/A 01/22/2015    Procedure: Left Heart Cath and Coronary Angiography;  Surgeon: Corky Crafts, MD;  Location: [facility] INVASIVE CV LAB;  Service: Cardiovascular;  Laterality: N/A;  . Cardiac catheterization N/A 01/22/2015    Procedure: Coronary Stent Intervention;  Surgeon: Corky Crafts, MD;  Location: [facility] INVASIVE CV LAB;  Service: Cardiovascular;  Laterality: N/A;  prox rca   HPI:  44 year old man with history of HTN an DM admitted after cardiac arrest and fall with head hitting hard on concrete. CPR aproximately 25 min. Emergent heart cath. Intubated 10/15-10/25. MRI findings are nonspecific, primary concern is for possible hypoxic ischemic injury given the history of prolonged cardiac arrest. CXR Left base atelectasis is stable. No new opacity.   Assessment / Plan / Recommendation Clinical Impression  Pt exhibits significant cognitive and communicative impairments requiring max verbal/visual cues to mouth his name (not independently mouthing). Family stated pt gestured for  water using hand yesterday. Unable to achieve vocalization nor attempting verbal communication likely due to combination of cognitive deficits/anoxia . Suspect laryngeal edema following 10 day intubation. Multimodal cueing provided for labial formation of phonemes. Head gestures to basic/biographical yes/no questions to 30% given additional time to process information with repetition needed. ST to maxixmize cognitive-communicative abilities.      SLP Assessment  Patient needs continued Speech Lanaguage Pathology Services    Follow Up Recommendations  Inpatient Rehab    Frequency and Duration min 2x/week  2 weeks   Pertinent Vitals/Pain Pain Assessment: No/denies pain Faces Pain Scale: No hurt   SLP Goals  Progression toward goals: Progressing toward goals Potential to Achieve Goals (ACUTE ONLY):  (fair-good) Potential Considerations (ACUTE ONLY): Severity of impairments  SLP Evaluation Prior Functioning  Cognitive/Linguistic Baseline: Within functional limits Type of Home: House Available Help at Discharge: Family;Available 24 hours/day   Cognition  Overall Cognitive Status: Impaired/Different from baseline Arousal/Alertness: Awake/alert Orientation Level: Disoriented to place;Disoriented to time;Disoriented to situation Attention: Sustained Sustained Attention: Impaired Sustained Attention Impairment: Functional basic Memory: Impaired Memory Impairment:  (will continue to assess) Awareness: Impaired Awareness Impairment: Intellectual impairment;Emergent impairment;Anticipatory impairment Problem Solving: Impaired Problem Solving Impairment: Functional basic Safety/Judgment: Impaired    Comprehension  Auditory Comprehension Overall Auditory Comprehension: Impaired Yes/No Questions: Impaired Basic Biographical Questions: 26-50% accurate Commands: Impaired One Step Basic Commands: 75-100% accurate Interfering Components: Attention;Processing speed EffectiveTechniques:  Extra processing time Visual Recognition/Discrimination Discrimination: Not tested Reading Comprehension Reading Status: Not tested    Expression Expression Primary Mode of Expression: Nonverbal - gestures (nonverbal, family reports gesturing for water) Verbal Expression Overall Verbal Expression:  (will assess) Automatic Speech:  (no response) Written Expression  Dominant Hand: Right Written Expression: Not tested   Oral / Motor Oral Motor/Sensory Function Overall Oral Motor/Sensory Function:  (decreased labial/lingual movement) Motor Speech Overall Motor Speech: Impaired Respiration: Impaired Phonation:  (no vocalizations)   GO     [name], [name] 02/03/2015, 1:37 PM   [name] M.Ed [employer] Pager [phone]

## 2015-02-03 NOTE — Progress Notes (Signed)
Speech Language Pathology Treatment: Dysphagia  Patient Details Name: Albert Jensen MRN: [id number] DOB: 09/22/1970 Today's Date: 02/03/2015 Time: 4098-11911100-1115 SLP Time Calculation (min) (ACUTE ONLY): 15 min  Assessment / Plan / Recommendation Clinical Impression  Pt with large bore NGT (which typically is NOT used for feeding). Albert Jensen appears to have multiple factors for current aphonia and decreased efforts at communication including cognitive impairments/lanoxia and probable laryngeal edema (10 day intubation). Given additional time to respond pt mouthed his name x 1, however he has not spontaneously attempted to mouth his needs/thoughts. Max verbal/visual cues for phonation with humming and coughing were unsuccessful this session. Pt produced reflexive cough only and unable to expectorate audible pharyngeal mucous. He followed commands with additional processing time and visual/verbal cues for deliberate head nods/shakes. Continue ST intervention.    HPI Other Pertinent Information: 44 year old man with history of HTN an DM admitted after cardiac arrest and fall with head hitting hard on concrete. CPR aproximately 25 min. Emergent heart cath. Intubated 10/15-10/25. MRI findings are nonspecific, primary concern is for possible hypoxic ischemic injury given the history of prolonged cardiac arrest. CXR Left base atelectasis is stable. No new opacity.   Pertinent Vitals Pain Assessment: Faces Faces Pain Scale: Hurts little more  SLP Plan  Continue with current plan of care    Recommendations Diet recommendations: NPO Medication Administration: Via alternative means              General recommendations: Rehab consult Oral Care Recommendations: Oral care QID Follow up Recommendations: Inpatient Rehab Plan: Continue with current plan of care    GO     Albert Jensen, Albert Jensen 02/03/2015, 11:59 AM   [name] M.Ed [employer] Pager [phone]

## 2015-02-03 NOTE — Progress Notes (Signed)
RT note late entry: Rt called by rapid response for patient needing BIPAP. Patient was satting 56-75%. Placed on BIPAP. Patient became unresponsive . Called code RT began ambu bagging patient. Patient became some what responsive.Patient was intubated by anaesthesia RN with RTs help. BLB breath sounds with positive ETCO2. RT transported patient on vent to 2H06. Assisted DR. McQuaid with bedside bronch.

## 2015-02-03 NOTE — Progress Notes (Signed)
[facility] TEAM 1 - Stepdown/ICU TEAM Progress Note  Albert MarylandRicky Jensen RUE:[id number] DOB: 08/19/1970 DOA: 01/22/2015 PCP: Pcp Not In System  Admit HPI / Brief Narrative: 44 y/o WM PMHx HTN, DM presented 10/15 after witnessed arrest. Initial rhythm PEA on EMS arrival with approx 25 mins CPR before ROSC (15 mins bystander and 10 mins with EMS). EKG concerning for STEMI, after negative head CT he was taken urgently to cath lab and PCCM consulted for vent management/hypothermia protocol.   HPI/Subjective: 10/27 opens eyes will follow commands, otherwise nonverbal. MAXIMUM TEMPERATURE overnight 38.9C  Assessment/Plan: Acute respiratory failure with hypoxia -Multifactorial to include post cardiac arrest and HCAP -Continue antibiotics for 7-10 days -Mucinex DM BID -NTS BID  Patient has multiple ribs dislocated from his sternum and every time he is placed on a wean internal rebreathing is noted.  Cardiac arrest - PEA/STEMI  -RCA occlusion - s/p stenting 10/15 -ASA, brilinta -Hold home lisinopril, tricor, HCTZ further -Continue Coreg 25 BID  ARF, r/o overdiuresis, hypernatremia, hypovolemia, hyopK -Monitor closely  Dysphagia -Failed swallow study; ultrasound placement of NG tube completed today. Ed -Continue NPO  UTI/Fever  -Current medication cover UTI  Increase stools, r/;o cdiff Send cdiff - neg Fevers improved, add stop date 2 more days then dc zosyn and observe  DM Type 2 uncontrolled -10/16 hemoglobin A1c = 12.3   -Increase Lantus to 50 units ,  -Resistant SSI  AMS / Hypoxic Encephalopathy - post cardiac arrest  -Fall - head CT neg  -Patient was following commands    Code Status: FULL Family Communication: no family present at time of exam Disposition Plan:  CIR vs SNF    Consultants: Dr.Daniel Daneil DanJ Feinstein [facility](PCC M) Dr.Peter Phillips Hay Nishan (cardiology)   Procedure/Significant Events: CT head/c-spine 10/15 >> neg for acute process ECHO 10/18 >> LV mild  dilation, EF 65-70%, no RWMA, normal PA pressure EEG pending   Culture 10/15 MRSA by PCR negative 10/16 urine Urine culture 10/16 >> negative  Sputum 10/22 >>rare candida  BC 10/22 left arm>> NGTD 10/22 urine NGTD 10/22 tracheal aspirate positive rare candida albicans  Antibiotics:  Rocephin 10/16 >> 10/20 Vanco 10/22 >> 10/24 Zosyn 10/22 >>   DVT prophylaxis: Lovenox   Devices    LINES / TUBES:      Continuous Infusions: . feeding supplement (GLUCERNA 1.2 CAL)      Objective: VITAL SIGNS: Temp: 101.1 F (38.4 C) (10/27 2016) Temp Source: Oral (10/27 2016) BP: 108/78 mmHg (10/27 2016) Pulse Rate: 119 (10/27 2016) SPO2; FIO2:   Intake/Output Summary (Last 24 hours) at 02/03/15 2102 Last data filed at 02/03/15 2037  Gross per 24 hour  Intake      0 ml  Output   1075 ml  Net  -1075 ml     Exam: General: opens eyes will follow commands, otherwise nonverbal.Alert, follows commands, No acute respiratory distress Eyes: Negative headache, eye pain, double vision,negative scleral hemorrhage ENT: Negative Runny nose, negative ear pain, negative gingival bleeding, Neck:  Negative scars, masses, torticollis, lymphadenopathy, JVD Lungs: Clear to auscultation bilaterally without wheezes or crackles Cardiovascular: Tachycardic, Regular rhythm without murmur gallop or rub normal S1 and S2 Abdomen:negative abdominal pain, nondistended, positive soft, bowel sounds, no rebound, no ascites, no appreciable mass Extremities: can wiggle toes of left foot to command, able to slightly slide right leg across bed significant cyanosis, clubbing, or edema bilateral lower extremities Psychiatric:  Negative depression, negative anxiety, negative fatigue, negative mania Neurologic:  Cranial nerves II through XII intact,  tongue/uvula midline, all extremities muscle strength 5/5, sensation intact throughout,normal limits,  negative dysarthria, negative expressive aphasia, negative  receptive aphasia.   Data Reviewed: Basic Metabolic Panel:  Recent Labs Lab 01/28/15 0355 01/29/15 0425 01/30/15 0330 01/31/15 0425 02/01/15 0332 02/02/15 0223 02/03/15 0303  NA 144 140 144 147* 143 147* 142  K 3.3* 3.6 3.6 3.5 3.3* 3.7 3.8  CL 103 100* 104 102 99* 103 104  CO2 31 32 32 34* 35* 32 28  GLUCOSE 253* 290* 239* 260* 271* 157* 353*  BUN 28* 30* 34* 42* 41* 32* 43*  CREATININE 0.84 0.85 1.02 1.31* 1.26* 1.15 1.31*  CALCIUM 8.9 9.0 9.1 9.3 9.0 9.3 8.9  MG 2.1 2.2  --  2.6*  --   --   --   PHOS 3.3 3.0  --  4.4 3.0 3.0  --    Liver Function Tests: No results for input(s): AST, ALT, ALKPHOS, BILITOT, PROT, ALBUMIN in the last 168 hours. No results for input(s): LIPASE, AMYLASE in the last 168 hours. No results for input(s): AMMONIA in the last 168 hours. CBC:  Recent Labs Lab 01/29/15 0425 01/30/15 0330 01/31/15 0425 02/01/15 0332 02/02/15 0223  WBC 8.5 9.3 9.4 9.8 9.7  NEUTROABS  --   --   --  7.3  --   HGB 12.5* 12.3* 11.9* 12.6* 12.8*  HCT 38.8* 37.7* 37.8* 39.9 41.1  MCV 89.8 90.2 91.3 91.5 92.4  PLT 279 311 350 339 373   Cardiac Enzymes: No results for input(s): CKTOTAL, CKMB, CKMBINDEX, TROPONINI in the last 168 hours. BNP (last 3 results) No results for input(s): BNP in the last 8760 hours.  ProBNP (last 3 results) No results for input(s): PROBNP in the last 8760 hours.  CBG:  Recent Labs Lab 02/03/15 0416 02/03/15 0840 02/03/15 1207 02/03/15 1645 02/03/15 2009  GLUCAP 318* 303* 277* 223* 268*    Recent Results (from the past 240 hour(s))  Culture, blood (routine x 2)     Status: None   Collection Time: 01/29/15  3:45 PM  Result Value Ref Range Status   Specimen Description BLOOD BLOOD LEFT ARM  Final   Special Requests BOTTLES DRAWN AEROBIC AND ANAEROBIC 10CC  Final   Culture NO GROWTH 5 DAYS  Final   Report Status 02/03/2015 FINAL  Final  Culture, blood (routine x 2)     Status: None   Collection Time: 01/29/15  4:00 PM    Result Value Ref Range Status   Specimen Description BLOOD BLOOD LEFT ARM  Final   Special Requests BOTTLES DRAWN AEROBIC AND ANAEROBIC 5CC  Final   Culture NO GROWTH 5 DAYS  Final   Report Status 02/03/2015 FINAL  Final  Culture, Urine     Status: None   Collection Time: 01/29/15  4:29 PM  Result Value Ref Range Status   Specimen Description URINE, CATHETERIZED  Final   Special Requests NONE  Final   Culture NO GROWTH 1 DAY  Final   Report Status 01/30/2015 FINAL  Final  Culture, respiratory (NON-Expectorated)     Status: None   Collection Time: 01/29/15  4:43 PM  Result Value Ref Range Status   Specimen Description TRACHEAL ASPIRATE  Final   Special Requests NONE  Final   Gram Stain   Final    ABUNDANT WBC PRESENT,BOTH PMN AND MONONUCLEAR FEW SQUAMOUS EPITHELIAL CELLS PRESENT NO ORGANISMS SEEN Performed at [facility]    Culture   Final    RARE CANDIDA  ALBICANS Performed at [facility]    Report Status 01/31/2015 FINAL  Final  C difficile quick scan w PCR reflex     Status: None   Collection Time: 02/01/15 10:01 AM  Result Value Ref Range Status   C Diff antigen NEGATIVE NEGATIVE Final   C Diff toxin NEGATIVE NEGATIVE Final   C Diff interpretation Negative for toxigenic C. difficile  Final     Studies:  Recent x-ray studies have been reviewed in detail by the Attending Physician  Scheduled Meds:  Scheduled Meds: . antiseptic oral rinse  7 mL Mouth Rinse 6 times per day  . aspirin  81 mg Per Tube Daily  . atorvastatin  80 mg Per Tube q1800  . carvedilol  25 mg Per Tube BID WC  . enoxaparin (LOVENOX) injection  40 mg Subcutaneous Q24H  . free water  250 mL Per Tube Q4H  . guaiFENesin-dextromethorphan  5 mL Oral BID  . insulin aspart  0-20 Units Subcutaneous 6 times per day  . insulin aspart  4 Units Subcutaneous 6 times per day  . [START ON 02/04/2015] insulin glargine  50 Units Subcutaneous Q24H  . pantoprazole sodium  40 mg Per Tube Daily  .  piperacillin-tazobactam (ZOSYN)  IV  3.375 g Intravenous 3 times per day  . potassium chloride  20 mEq Per Tube Daily  . potassium chloride  40 mEq Oral Once  . sodium chloride  3 mL Intravenous Q12H  . ticagrelor  90 mg Per Tube BID    Time spent on care of this patient: 40 mins   Pat Elicker, Roselind Messier , MD  [facility] Office  [phone] Pager - [phone]  On-Call/Text Page:      [email or web address]      password [email or web address]  If 7PM-7AM, please contact night-coverage [email or web address] Password [email or web address] 02/03/2015, 9:02 PM   LOS: 12 days   Care during the described time interval was provided by me .  I have reviewed this patient's available data, including medical history, events of note, physical examination, and all test results as part of my evaluation. I have personally reviewed and interpreted all radiology studies.   Carolyne Littles, MD [phone] Pager

## 2015-02-03 NOTE — Progress Notes (Signed)
Rehab admissions - Please see consult done by Dr. Allena KatzPatel today.  I will call family to discuss rehab options and determine most appropriate venue of care for rehab.  Call me for questions.  #[phone]

## 2015-02-03 NOTE — Procedures (Signed)
PCCM Video Bronchoscopy Procedure Note  The patient was informed of the risks (including but not limited to bleeding, infection, respiratory failure, lung injury, tooth/oral injury) and benefits of the procedure and gave consent, see chart.  Indication: Acute respiratory failure with hypoxemia  Post Procedure Diagnosis: Mucus plugging left mainstem bronchus  Location: 2H [place] ICU  Condition pre procedure: critically ill, on vent  Medications for procedure: fentanyl 200mcg IV  Procedure description: The bronchoscope was introduced through the endotracheal tube and passed to the bilateral lungs to the level of the subsegmental bronchi throughout the tracheobronchial tree.  Airway exam revealed copious, thick secretions in the left mainstem bronchus.  With extensive suctioning and washing with sterile saline I was able to clear the airway.  Procedures performed: therapeutic aspiration of the left mainstem bronchus  Specimens sent: bronch wash left mainstem bronchus  Condition post procedure: critically ill, on vent  EBL: none  Complications: none immediate  Heber CarolinaBrent McQuaid, MD [facility] Pager: [phone] Cell: [phone] After 3pm or if no response, call [phone]

## 2015-02-03 NOTE — Progress Notes (Signed)
PULMONARY / CRITICAL CARE MEDICINE   Name: Albert Jensen MRN: [id number] DOB: 1970/12/20    ADMISSION DATE:  01/22/2015 CONSULTATION DATE:  10/15  REFERRING MD :  Isabel Caprice   CHIEF COMPLAINT:  Cardiac arrest   INITIAL PRESENTATION: 44 y/o male with hx HTN, DM presented 10/15 after witnessed arrest.  Initial rhythm PEA on EMS arrival with approx 25 mins CPR before ROSC (15 mins bystander and 10 mins with EMS). EKG concerning for STEMI, after negative head CT he was taken urgently to cath lab and PCCM consulted for vent management/hypothermia protocol. Underwent cardiac catheterization RCA showed mid RCA 99% lesion. Underwent thrombectomy, PCI/Synergy stent. Hospital course altered mental status after cardiac arrest with neurology services consulted. EEG showed no seizure activity suspect hypoxic encephalopathy. Treated with Zosyn Vanc & for suspect HCAP. He was x-rayed at 10/25, and transferred to floor 10/26. 10/27 had acute respiratory status decompensation requiring intubation, thick secretions with mucous plugs identified.  STUDIES:  CT head/c-spine 10/15 >> neg for acute process ECHO 10/18 >> LV mild dilation, EF 65-70%, no RWMA, normal PA pressure 10/21 EEG >nabnormal EEG with findings consistent with a severe encephalopathy, non specific as to cause. No electrographic seizures noted. Clinical correlation is advised.   SIGNIFICANT EVENTS: 10/15  Admit after witnessed cardiac arrest.  To cath lab rca stent 10/25 extubated 10/27 intubated, back to ICU, mucous plugging.   SUBJECTIVE: He was extubated 10/25 and transferred to SDU floor, however 10/27 PM he became marked SOB and hypoxic requiring BiPAP, which he rapidly failed. He became unresponsive and a CODE BLUE was called. He isn't sure therapies. Chest x-ray was positive for complete opacification of the left hemithorax. Respiratory therapist attempted to suction via in-line ET tube suctioning and was essentially unable to due to  extremely thick yellow secretions. Mucous plugs identified. The patient was transferred to ICU and P CCM was contacted for further evaluation.  VITAL SIGNS: Temp:  [99.7 F (37.6 C)-102.1 F (38.9 C)] 101.1 F (38.4 C) (10/27 2016) Pulse Rate:  [106-119] 119 (10/27 2016) Resp:  [24-26] 24 (10/27 2016) BP: (108-132)/(73-86) 108/78 mmHg (10/27 2016) SpO2:  [91 %-95 %] 95 % (10/27 1345) FiO2 (%):  [55 %] 55 % (10/27 2032)  HEMODYNAMICS:    VENTILATOR SETTINGS: Vent Mode:  [-]  FiO2 (%):  [55 %] 55 % INTAKE / OUTPUT:  Intake/Output Summary (Last 24 hours) at 02/03/15 2132 Last data filed at 02/03/15 2037  Gross per 24 hour  Intake      0 ml  Output   1075 ml  Net  -1075 ml   PHYSICAL EXAMINATION: General:  Obese male now on vent Neuro:  Obtunded HEENT:  /AT, No JVD noted, PERRL Cardiovascular:  RRR, no MRG Lungs:  Coarse breath sounds R, absent breath sounds L Abdomen:  Soft, non-distended Musculoskeletal:  No acute deformity Skin:  Intact, MMM    LABS:  CBC  Recent Labs Lab 01/31/15 0425 02/01/15 0332 02/02/15 0223  WBC 9.4 9.8 9.7  HGB 11.9* 12.6* 12.8*  HCT 37.8* 39.9 41.1  PLT 350 339 373   Coag's  Recent Labs Lab 01/31/15 1200  APTT 31  INR 1.18   BMET  Recent Labs Lab 02/01/15 0332 02/02/15 0223 02/03/15 0303  NA 143 147* 142  K 3.3* 3.7 3.8  CL 99* 103 104  CO2 35* 32 28  BUN 41* 32* 43*  CREATININE 1.26* 1.15 1.31*  GLUCOSE 271* 157* 353*   Electrolytes  Recent Labs Lab 01/28/15  [id number] 01/29/15 0425  01/31/15 0425 02/01/15 0332 02/02/15 0223 02/03/15 0303  CALCIUM 8.9 9.0  < > 9.3 9.0 9.3 8.9  MG 2.1 2.2  --  2.6*  --   --   --   PHOS 3.3 3.0  --  4.4 3.0 3.0  --   < > = values in this interval not displayed. Sepsis Markers  Recent Labs Lab 01/29/15 1550 01/30/15 0330 01/31/15 0425  PROCALCITON 0.34 0.30 0.26     ABG  Recent Labs Lab 01/29/15 0508 01/31/15 1036 02/03/15 2050  PHART 7.497* 7.500* 7.538*   PCO2ART 44.5 47.1* 31.6*  PO2ART 70.0* 71.0* 47.7*   Liver Enzymes No results for input(s): AST, ALT, ALKPHOS, BILITOT, ALBUMIN in the last 168 hours.   Cardiac Enzymes No results for input(s): TROPONINI, PROBNP in the last 168 hours. Glucose  Recent Labs Lab 02/03/15 0040 02/03/15 0416 02/03/15 0840 02/03/15 1207 02/03/15 1645 02/03/15 2009  GLUCAP 320* 318* 303* 277* 223* 268*   Imaging Dg Chest Port 1 View  02/03/2015  CLINICAL DATA:  Acute onset of respiratory distress. Initial encounter. EXAM: PORTABLE CHEST 1 VIEW COMPARISON:  Chest radiograph performed 02/01/2015. FINDINGS: There is near complete opacification of the left hemithorax, reflecting a relatively large left-sided pleural effusion and associated airspace opacification. There is mild leftward mediastinal shift, reflecting mild left-sided volume loss. The right lung appears relatively clear. No pneumothorax is seen. The cardiomediastinal silhouette is not well assessed due to airspace consolidation. No acute osseous abnormalities are identified. The patient's enteric tube is noted extending overlying the body of the stomach. IMPRESSION: Near-complete opacification of the left hemithorax, reflecting a relatively large left-sided pleural effusion and associated airspace opacification. Mild leftward mediastinal shift reflects mild left-sided volume loss. This may reflect pneumonia. Would perform follow-up chest radiograph after completion of treatment, to ensure resolution of airspace opacity. Electronically Signed   By: Roanna RaiderJeffery  Chang M.D.   On: 02/03/2015 21:17   Dg Abd Portable 1v  02/03/2015  CLINICAL DATA:  Nasogastric tube placement EXAM: PORTABLE ABDOMEN - 1 VIEW COMPARISON:  Portable exam 1622 hours compared to 02/02/2015 FINDINGS: Tip of feeding tube projects over expected position of ligament of Treitz or proximal jejunum. Bowel gas pattern normal. No bowel dilatation or bowel wall thickening. IMPRESSION: Tip of  feeding tube projects over either the ligament of Treitz or proximal jejunum. Electronically Signed   By: Ulyses SouthwardMark  Boles M.D.   On: 02/03/2015 16:30   ASSESSMENT / PLAN:  PULMONARY OETT 10/15 >>10/25 OETT 10/27 >> Acute respiratory failure - post cardiac arrest  Mucous plugging Aspiration vs post-obstruction pneumonitis vs recurrent VAP/HCAP Rib fractures complicating spontaneous respirations  P:   Full vent support Chest PT Mucomyst F/u BAL VAP bundle ABG  CARDIOVASCULAR CVL L IJ 10/16 >> Cardiac arrest - PEA  STEMI  RCA occlusion - s/p stenting 10/15  P:  Telemetry monitoring ASA, brilinta Hold home lisinopril, tricor, HCTZ further Continue Coreg 25 BID  RENAL ARF > worsening suspect pre-renal  P:   Bmet in am  IVF hydration Free water Correct electrolytes as indicated  GASTROINTESTINAL Postpyloric tube 10/27 > Dysphagia  P:   PPI Failed SLP eval 10/27 Hold TF for tonight, re-eval in AM with concern possible aspiration  HEMATOLOGIC DVT prevention P:  F/u CBC Lovenox  INFECTIOUS UTI  Postobstructive pneumonitis favored vs aspiration of TF vs VAP/HCAP  P:   Urine culture 10/16 >> negative  Sputum 10/22 >>rare candida  BC 10/22 >> neg  Rocephin  10/16 >> 10/20 Vanco 10/22 >> 10/24 Zosyn 10/22 >>>     proposed stop date 10/28 BAL 10/27 >>> Cdiff - neg  ENDOCRINE DM - Uncontrolled  P:   resistant scale  Hold lantus while holding TF When restart TF, resume lantus at 50.  NEUROLOGIC AMS / Hypoxic Encephalopathy  - post cardiac arrest  Fall - head CT neg   P:   RASS goal:-1 Precedex infusion PRN fentanyl PRN versed  FAMILY  - Updates:   GLOBAL: CSW consult for financial assistance with meds   Joneen Roach, AGACNP-BC [facility] Pager [phone] or [phone]  02/03/2015 11:07 PM    Attending:  I have seen and examined the patient with nurse practitioner/resident and agree with the note above.    Mr. Enyeart is well known to our service.  Briefly, he had a cardiac arrest in the setting of an RCA occlusion on 10/15 and underwent hypothermia protocol after 25 minutes of CPR.  He was intubated for over a week bu was extubated on 10/25.  On 10/27 he had a respiratory arrest and PCCM was consulted  On exam: no air movement left lung, even while on vent, coughs, gags to stimuli,   CXR > white out left lung  Acute respiratory failure with hypoxemia> due to mucus plugging left lung.  Intubate, full vent support, bronchoscopy for airway clearance, send for culture, will need tracheostomy Rest as above  My cc time outside of procedures is 40 minutes  Heber [place], MD [facility] Pager: [phone] Cell: [phone] After 3pm or if no response, call [phone]

## 2015-02-04 ENCOUNTER — Inpatient Hospital Stay (HOSPITAL_COMMUNITY): Payer: Managed Care, Other (non HMO)

## 2015-02-04 ENCOUNTER — Ambulatory Visit (HOSPITAL_COMMUNITY): Payer: Managed Care, Other (non HMO)

## 2015-02-04 DIAGNOSIS — J189 Pneumonia, unspecified organism: Secondary | ICD-10-CM | POA: Insufficient documentation

## 2015-02-04 DIAGNOSIS — N170 Acute kidney failure with tubular necrosis: Secondary | ICD-10-CM

## 2015-02-04 DIAGNOSIS — I2102 ST elevation (STEMI) myocardial infarction involving left anterior descending coronary artery: Secondary | ICD-10-CM

## 2015-02-04 DIAGNOSIS — J9811 Atelectasis: Secondary | ICD-10-CM | POA: Insufficient documentation

## 2015-02-04 LAB — BLOOD GAS, ARTERIAL
ACID-BASE EXCESS: 3.1 mmol/L — AB (ref 0.0–2.0)
Bicarbonate: 26 mEq/L — ABNORMAL HIGH (ref 20.0–24.0)
DRAWN BY: 365271
FIO2: 0.6
O2 SAT: 98.7 %
PATIENT TEMPERATURE: 98.6
PCO2 ART: 32.3 mmHg — AB (ref 35.0–45.0)
PEEP/CPAP: 10 cmH2O
PH ART: 7.517 — AB (ref 7.350–7.450)
RATE: 12 resp/min
TCO2: 27 mmol/L (ref 0–100)
VT: 650 mL
pO2, Arterial: 131 mmHg — ABNORMAL HIGH (ref 80.0–100.0)

## 2015-02-04 LAB — COMPREHENSIVE METABOLIC PANEL
ALBUMIN: 2.1 g/dL — AB (ref 3.5–5.0)
ALK PHOS: 59 U/L (ref 38–126)
ALT: 33 U/L (ref 17–63)
AST: 78 U/L — AB (ref 15–41)
Anion gap: 15 (ref 5–15)
BUN: 46 mg/dL — ABNORMAL HIGH (ref 6–20)
CALCIUM: 8.8 mg/dL — AB (ref 8.9–10.3)
CO2: 27 mmol/L (ref 22–32)
CREATININE: 1.66 mg/dL — AB (ref 0.61–1.24)
Chloride: 107 mmol/L (ref 101–111)
GFR calc Af Amer: 57 mL/min — ABNORMAL LOW (ref >60)
GFR calc non Af Amer: 49 mL/min — ABNORMAL LOW (ref >60)
GLUCOSE: 290 mg/dL — AB (ref 65–99)
Potassium: 3.5 mmol/L (ref 3.5–5.1)
SODIUM: 149 mmol/L — AB (ref 135–145)
Total Bilirubin: 0.8 mg/dL (ref 0.3–1.2)
Total Protein: 6.4 g/dL — ABNORMAL LOW (ref 6.5–8.1)

## 2015-02-04 LAB — CBC WITH DIFFERENTIAL/PLATELET
BASOS ABS: 0 10*3/uL (ref 0.0–0.1)
BASOS PCT: 0 %
EOS ABS: 0 10*3/uL (ref 0.0–0.7)
Eosinophils Relative: 0 %
HCT: 40.6 % (ref 39.0–52.0)
HEMOGLOBIN: 13.1 g/dL (ref 13.0–17.0)
LYMPHS ABS: 1.4 10*3/uL (ref 0.7–4.0)
Lymphocytes Relative: 10 %
MCH: 29.3 pg (ref 26.0–34.0)
MCHC: 32.3 g/dL (ref 30.0–36.0)
MCV: 90.8 fL (ref 78.0–100.0)
Monocytes Absolute: 0.6 10*3/uL (ref 0.1–1.0)
Monocytes Relative: 4 %
NEUTROS PCT: 86 %
Neutro Abs: 11.2 10*3/uL — ABNORMAL HIGH (ref 1.7–7.7)
Platelets: 346 10*3/uL (ref 150–400)
RBC: 4.47 MIL/uL (ref 4.22–5.81)
RDW: 13.6 % (ref 11.5–15.5)
WBC: 13.1 10*3/uL — AB (ref 4.0–10.5)

## 2015-02-04 LAB — GLUCOSE, CAPILLARY
GLUCOSE-CAPILLARY: 257 mg/dL — AB (ref 65–99)
GLUCOSE-CAPILLARY: 244 mg/dL — AB (ref 65–99)
Glucose-Capillary: 239 mg/dL — ABNORMAL HIGH (ref 65–99)
Glucose-Capillary: 212 mg/dL — ABNORMAL HIGH (ref 65–99)
Glucose-Capillary: 254 mg/dL — ABNORMAL HIGH (ref 65–99)
Glucose-Capillary: 323 mg/dL — ABNORMAL HIGH (ref 65–99)

## 2015-02-04 LAB — MAGNESIUM: Magnesium: 2.7 mg/dL — ABNORMAL HIGH (ref 1.7–2.4)

## 2015-02-04 MED ORDER — ETOMIDATE 2 MG/ML IV SOLN
INTRAVENOUS | Status: AC
  Filled 2015-02-04: qty 20

## 2015-02-04 MED ORDER — PROPOFOL 500 MG/50ML IV EMUL
5.0000 ug/kg/min | Freq: Once | INTRAVENOUS | Status: DC
  Filled 2015-02-04: qty 50

## 2015-02-04 MED ORDER — ETOMIDATE 2 MG/ML IV SOLN
40.0000 mg | Freq: Once | INTRAVENOUS | Status: AC
  Administered 2015-02-04: 20 mg via INTRAVENOUS

## 2015-02-04 MED ORDER — INSULIN GLARGINE 100 UNIT/ML ~~LOC~~ SOLN
25.0000 [IU] | SUBCUTANEOUS | Status: DC
  Administered 2015-02-04: 25 [IU] via SUBCUTANEOUS
  Filled 2015-02-04 (×3): qty 0.25

## 2015-02-04 MED ORDER — VECURONIUM BROMIDE 10 MG IV SOLR
10.0000 mg | Freq: Once | INTRAVENOUS | Status: DC
  Filled 2015-02-04: qty 10

## 2015-02-04 MED ORDER — PRO-STAT SUGAR FREE PO LIQD
60.0000 mL | Freq: Three times a day (TID) | ORAL | Status: DC
  Administered 2015-02-04 – 2015-02-07 (×11): 60 mL
  Filled 2015-02-04 (×11): qty 60

## 2015-02-04 MED ORDER — PROPOFOL 1000 MG/100ML IV EMUL
INTRAVENOUS | Status: AC
  Filled 2015-02-04: qty 100

## 2015-02-04 MED ORDER — PHENYLEPHRINE HCL 10 MG/ML IJ SOLN
0.0000 ug/min | INTRAMUSCULAR | Status: DC
  Filled 2015-02-04: qty 1

## 2015-02-04 MED ORDER — SODIUM CHLORIDE 0.9 % IV SOLN
1.0000 g | Freq: Three times a day (TID) | INTRAVENOUS | Status: DC
  Administered 2015-02-04 – 2015-02-07 (×9): 1 g via INTRAVENOUS
  Filled 2015-02-04 (×12): qty 1

## 2015-02-04 MED ORDER — VITAL HIGH PROTEIN PO LIQD
1000.0000 mL | ORAL | Status: DC
  Administered 2015-02-04 – 2015-02-07 (×3): 1000 mL
  Filled 2015-02-04 (×7): qty 1000

## 2015-02-04 MED ORDER — FENTANYL CITRATE (PF) 100 MCG/2ML IJ SOLN: 25.0000 ug | INTRAMUSCULAR | Status: AC | PRN

## 2015-02-04 MED ORDER — SUCCINYLCHOLINE CHLORIDE 20 MG/ML IJ SOLN
INTRAMUSCULAR | Status: AC
  Filled 2015-02-04: qty 1

## 2015-02-04 MED ORDER — FENTANYL CITRATE (PF) 100 MCG/2ML IJ SOLN
200.0000 ug | Freq: Once | INTRAMUSCULAR | Status: AC
  Administered 2015-02-04: 200 ug via INTRAVENOUS
  Filled 2015-02-04: qty 4

## 2015-02-04 MED ORDER — DEXTROSE 5 % IV SOLN
INTRAVENOUS | Status: DC
  Administered 2015-02-04 – 2015-02-05 (×2): via INTRAVENOUS

## 2015-02-04 MED ORDER — ROCURONIUM BROMIDE 50 MG/5ML IV SOLN
INTRAVENOUS | Status: AC
  Filled 2015-02-04: qty 2

## 2015-02-04 MED ORDER — INSULIN ASPART 100 UNIT/ML ~~LOC~~ SOLN
4.0000 [IU] | SUBCUTANEOUS | Status: DC
  Administered 2015-02-04 – 2015-02-07 (×20): 4 [IU] via SUBCUTANEOUS

## 2015-02-04 MED ORDER — MIDAZOLAM HCL 2 MG/2ML IJ SOLN
4.0000 mg | Freq: Once | INTRAMUSCULAR | Status: AC
  Administered 2015-02-03: 2 mg via INTRAVENOUS
  Filled 2015-02-04: qty 4

## 2015-02-04 MED ORDER — VANCOMYCIN HCL IN DEXTROSE 750-5 MG/150ML-% IV SOLN
750.0000 mg | Freq: Two times a day (BID) | INTRAVENOUS | Status: DC
  Administered 2015-02-04 – 2015-02-06 (×4): 750 mg via INTRAVENOUS
  Filled 2015-02-04 (×5): qty 150

## 2015-02-04 MED ORDER — LIDOCAINE HCL (CARDIAC) 20 MG/ML IV SOLN
INTRAVENOUS | Status: AC
  Filled 2015-02-04: qty 5

## 2015-02-04 MED ORDER — FLUCONAZOLE IN SODIUM CHLORIDE 100-0.9 MG/50ML-% IV SOLN
100.0000 mg | INTRAVENOUS | Status: AC
  Administered 2015-02-04: 100 mg via INTRAVENOUS
  Filled 2015-02-04: qty 50

## 2015-02-04 MED ORDER — GLUCERNA 1.2 CAL PO LIQD
1000.0000 mL | ORAL | Status: DC
  Administered 2015-02-04 (×2): 1000 mL
  Filled 2015-02-04 (×3): qty 1000

## 2015-02-04 MED ORDER — SODIUM CHLORIDE 0.9 % IV BOLUS (SEPSIS)
500.0000 mL | Freq: Once | INTRAVENOUS | Status: AC
  Administered 2015-02-04: 500 mL via INTRAVENOUS

## 2015-02-04 MED ORDER — COLLAGENASE 250 UNIT/GM EX OINT
TOPICAL_OINTMENT | Freq: Every day | CUTANEOUS | Status: DC
  Administered 2015-02-04 – 2015-02-08 (×5): via TOPICAL
  Filled 2015-02-04: qty 30

## 2015-02-04 MED FILL — Medication: Qty: 1 | Status: AC

## 2015-02-04 NOTE — Progress Notes (Signed)
Dr. Tyson AliasFeinstein notified of Temp 102.2.  HR increased;  BP decreased.  WBC increased.  Orders received for NS bolus.  Will continue to monitor.

## 2015-02-04 NOTE — Procedures (Signed)
Name:  Albert Jensen Eversley MRN:  [id number] DOB:  05/05/1970  OPERATIVE NOTE  Procedure:  Percutaneous tracheostomy.  Indications:  Ventilator-dependent respiratory failure.  Consent:  Procedure, alternatives, risks and benefits discussed with medical POA.  Questions answered.  Consent obtained.  Anesthesia: versed, fent, etomidate, precedex off  Procedure summary:  Appropriate equipment was assembled.  The patient was identified as Albert Jensen and safety timeout was performed. The patient was placed in supine position with a towel roll behind shoulder blades and neck extended.  Sterile technique was used. The patient's neck and upper chest were prepped using chlorhexidine / alcohol scrub and the field was draped in usual sterile fashion with full body drape. After the adequate sedation / anesthesia was achieved, attention was directed at the midline trachea, where the cricothyroid membrane was palpated. Approximately two fingerbreadths above the sternal notch, a verticle incision was created with a scalpel after local infiltration with 0.2% Lidocaine. Then, using Seldinger technique and a percutaneous tracheostomy set, the trachea was entered with a 14 gauge needle with an overlying sheath. This was all confirmed under direct visualization of a fiberoptic flexible bronchoscope. Entrance into the trachea was identified through the third tracheal ring interspace. Following this, a guidewire was inserted. The needle was removed, leaving the sheath and the guidewire intact. Next, the sheath was removed and a small dilator was inserted. The tracheal rings were then dilated. A #8 Shiley was then opened. The balloon was checked. It was placed over a tracheal dilator, which was then advanced over the guidewire and through the previously dilated tract. The Shiley tracheostomy tube was noted to pass in the trachea with little resistance. The guidewire and dilator tubes were removed from the trachea. An inner cannula was  placed through the tracheostomy tube. The tracheostomy was then secured at the anterior neck with 4 monofilament sutures. The oral endotracheal tube was removed and the ventilator was attached to the newly placed tracheostomy tube. Adequate tidal volumes were noted. The cuff was inflated and no evidence of air leak was noted. No evidence of bleeding was noted. At this point, the procedure was concluded. Post-procedure chest x-ray was ordered.  Complications:  No immediate complications were noted.  Hemodynamic parameters and oxygenation remained stable throughout the procedure.  Estimated blood loss:  Less then 1 mL.  Nelda BucksFEINSTEIN,Shigeko Manard J., MD [facility]monary and Critical Care Medicine [facility] Pager: [phone]  02/04/2015, 9:19 AM  Can follow up trach clinic [phone]

## 2015-02-04 NOTE — Consult Note (Signed)
WOC wound consult note Reason for Consult: sacral pressure injury  Wound type: Unstageable pressure injury, deep in the gluteal cleft Appears to have started as sDTI (suspected deep tissue injury) and is now evolving.  Pressure Ulcer POA: No Measurement: 5cm x 3.0cm x 0.1cm  Wound bed:50% dark purple, 50% grey soft centrally Drainage (amount, consistency, odor) none Periwound:intact, patient with psoriasis  over his body, and present just above the pressure injury Dressing procedure/placement/frequency: Add enzymatic debridement ointment to clear away necrotic tissue, will need to assess frequently as this area evolves. On Sport bed currently for offloading, I have added order for air mattress for pressure redistribution at the time of transfer from the ICU.  Prevalon boots in place per ICU staff, heels intact.  Explained pressure injury to the sister at the beside.   WOC team will follow along with you for weekly wound assessments.  Please notify me of any acute changes in the wounds or any new areas of concerns Armen PickupMelody Effrey Davidow RN,CWOCN [phone]

## 2015-02-04 NOTE — Progress Notes (Signed)
Pt's temperature remained elevated throughout day shift. After two PRN doses of PO tylenol, cooling blanket was placed under pt's core with a goal temp of 98.6.

## 2015-02-04 NOTE — Progress Notes (Signed)
Rehab admissions - Noted change in status.  Patient now on vent and has a trach.  I will follow at a distance for progress.  Call me for questions.  #[phone]

## 2015-02-04 NOTE — Progress Notes (Signed)
PT Cancellation Note  Patient Details Name: Albert MarylandRicky Doner MRN: [id number] DOB: 02/20/1971   Cancelled Treatment:    Reason Eval/Treat Not Completed: Medical issues which prohibited therapy   Arnoldo Moralemanda Miah Boye 02/04/2015, 8:07 AM  Arnoldo MoraleAmanda Serjio Deupree, SPT

## 2015-02-04 NOTE — Progress Notes (Signed)
SLP Cancellation Note  Patient Details Name: Albert Jensen MRN: [id number] DOB: 10/14/1970   Cancelled treatment:        Events of last night noted. Pt coded last night, mucous plug, intubated, bronch. This morning received trach and is on ventilator. Pt now monitored by trach team. ST will sign off for dysphagia and cognition-communication but will follow through tracheostomy team and request orders if appropriate.   [name], Chinwe Lope Willis 02/04/2015, 9:26 AM   [name] M.Ed [employer] Pager [phone]

## 2015-02-04 NOTE — Progress Notes (Signed)
ANTIBIOTIC CONSULT NOTE - FOLLOW UP  Pharmacy Consult for Meropenem and Vancomycin Indication: sepsis and pneumonia coverage  No Known Allergies  Patient Measurements: Height: 6\' 2"  (188 cm) Weight: 239 lb 3.2 oz (108.5 kg) IBW/kg (Calculated) : 82.2  Vital Signs: Temp: 103 F (39.4 C) (10/28 1430) Temp Source: Oral (10/28 1430) BP: 87/76 mmHg (10/28 1400) Pulse Rate: 115 (10/28 1400) Intake/Output from previous day: 10/27 0701 - 10/28 0700 In: 876.7 [I.V.:776.7; IV Piggyback:100] Out: 3050 [Urine:3050] Intake/Output from this shift: Total I/O In: 2291.9 [I.V.:2171.9; NG/GT:120] Out: 635 [Urine:635]  Labs:  Recent Labs  02/02/15 0223 02/03/15 0303 02/04/15 0621  WBC 9.7  --  13.1*  HGB 12.8*  --  13.1  PLT 373  --  346  CREATININE 1.15 1.31* 1.66*   Estimated Creatinine Clearance: 76 mL/min (by C-G formula based on Cr of 1.66).  Assessment:   Day # 7 Zosyn for HCAP.  Tmax 102.2, WBC up to 13.1   Broadening coverage to Meropenem and Vancomycin.   New blood cultures sent. BAL culture sent. Bronch/trach today.    Rx previously assisted with Vanc dosing 10/22-10/24.   BUN/creatinine have trended up over the last few days.  Goal of Therapy:  Vancomycin trough level 15-20 mcg/ml appropriate Meropenem dose for renal function and infection  Plan:   Meropenem 1 gram IV q8hrs.  Vancomycin 750 mg IV q12hrs.  Will follow for need to modify vanc regimen according to renal status.  Will follow renal function, culture data and progress.  Vanc trough level at steady-state.  Dennie FettersEgan, Issai Werling Donovan, RPh Pager: [phone] 02/04/2015,2:56 PM

## 2015-02-04 NOTE — Progress Notes (Signed)
OT Cancellation Note  Patient Details Name: Albert MarylandRicky Tal MRN: [id number] DOB: 09/13/1970   Cancelled Treatment:    Reason Eval/Treat Not Completed: Patient not medically ready. Pt with medical issues since evaluation, now intubated and sedated. Spoke with RN and feel it is better to wait until next week to start working with pt again as appropriate.  [name], [name] Eva [phone] 02/04/2015, 8:44 AM

## 2015-02-04 NOTE — Progress Notes (Signed)
Nutrition Follow-up  DOCUMENTATION CODES:   Obesity unspecified  INTERVENTION:   D/C Glucerna 1.2  Vital High Protein @ 40 ml/hr 60 ml Prostat TID  Provides: 1560 kcal, 174 grams protein, and 802 ml H2O.  Total free water 1802 ml  NUTRITION DIAGNOSIS:   Inadequate oral intake related to inability to eat as evidenced by NPO status. Ongoing.   GOAL:   Patient will meet greater than or equal to 90% of their needs Not yet met, TF held.   MONITOR:   TF tolerance, Diet advancement, PO intake, Labs, Weight trends, I & O's  REASON FOR ASSESSMENT:   Consult Enteral/tube feeding initiation and management  ASSESSMENT:   44yo male with hx HTN, DM presented 10/15 after witnessed arrest.  Patient is currently intubated on ventilator support MV: 11.4 L/min Temp (24hrs), Avg:101.7 F (38.7 C), Min:100 F (37.8 C), Max:103 F (39.4 C)  Patient extubated 10/25. Transferred to 2W-Cardiac 10/26. Pt re-intubated after mucous plugging left main collapse.  10/28 trach placed, no weaning for now.  TF held after trach placement.  250 ml H2O every 4 hours = 1000 ml  Diet Order:  Diet NPO time specified Diet NPO time specified  Skin:  Wound (see comment) (MASD on sacrum)  Last BM:  10/21  Height:   Ht Readings from Last 1 Encounters:  01/22/15 $RemoveB'6\' 2"'kMQujBbl$  (1.88 m)    Weight:   Wt Readings from Last 1 Encounters:  02/04/15 239 lb 3.2 oz (108.5 kg)    Ideal Body Weight:  86.3 kg  BMI:  Body mass index is 30.7 kg/(m^2).  Estimated Nutritional Needs:   Kcal:  2200-2400  Protein:  110-120 gm  Fluid:  2.2-2.4 L  EDUCATION NEEDS:   No education needs identified at this time  [place], [place], [place] Pager [phone] After Hours Pager

## 2015-02-04 NOTE — Progress Notes (Signed)
PULMONARY / CRITICAL CARE MEDICINE   Name: Albert Jensen MRN: [id number] DOB: Feb 11, 1971    ADMISSION DATE:  01/22/2015 CONSULTATION DATE:  10/15  REFERRING MD :  Isabel Caprice   CHIEF COMPLAINT:  Cardiac arrest   INITIAL PRESENTATION: 44 y/o male with hx HTN, DM presented 10/15 after witnessed arrest.  Initial rhythm PEA on EMS arrival with approx 25 mins CPR before ROSC (15 mins bystander and 10 mins with EMS). EKG concerning for STEMI, after negative head CT he was taken urgently to cath lab and PCCM consulted for vent management/hypothermia protocol. Underwent cardiac catheterization RCA showed mid RCA 99% lesion. Underwent thrombectomy, PCI/Synergy stent. Hospital course altered mental status after cardiac arrest with neurology services consulted. EEG showed no seizure activity suspect hypoxic encephalopathy. Treated with Zosyn Vanc & for suspect HCAP. He was x-rayed at 10/25, and transferred to floor 10/26. 10/27 had acute respiratory status decompensation requiring intubation, thick secretions with mucous plugs identified.  STUDIES:  CT head/c-spine 10/15 >> neg for acute process ECHO 10/18 >> LV mild dilation, EF 65-70%, no RWMA, normal PA pressure 10/21 EEG >nabnormal EEG with findings consistent with a severe encephalopathy, non specific as to cause. No electrographic seizures noted. Clinical correlation is advised.   SIGNIFICANT EVENTS: 10/15  Admit after witnessed cardiac arrest.  To cath lab rca stent 10/25 extubated 10/27 intubated, back to ICU, mucous plugging left main collapse   SUBJECTIVE: left lung improved after bronch  VITAL SIGNS: Temp:  [99.7 F (37.6 C)-102.9 F (39.4 C)] 100 F (37.8 C) (10/28 0806) Pulse Rate:  [101-120] 101 (10/28 0714) Resp:  [0-28] 21 (10/28 0714) BP: (81-132)/(66-86) 81/71 mmHg (10/28 0600) SpO2:  [86 %-100 %] 100 % (10/28 0714) FiO2 (%):  [50 %-60 %] 50 % (10/28 0714) Weight:  [108.5 kg (239 lb 3.2 oz)] 108.5 kg (239 lb 3.2 oz) (10/28  0500)  HEMODYNAMICS:    VENTILATOR SETTINGS: Vent Mode:  [-] PRVC FiO2 (%):  [50 %-60 %] 50 % Set Rate:  [12 bmp] 12 bmp Vt Set:  [650 mL] 650 mL PEEP:  [10 cmH20] 10 cmH20 Plateau Pressure:  [21 cmH20-23 cmH20] 21 cmH20 INTAKE / OUTPUT:  Intake/Output Summary (Last 24 hours) at 02/04/15 0825 Last data filed at 02/04/15 0600  Gross per 24 hour  Intake 876.68 ml  Output   3050 ml  Net -2173.32 ml   PHYSICAL EXAMINATION: General:  Obese male now on vent Neuro:  rass 0 , weak all ext, perrl HEENT:  PERRL Cardiovascular:  RRR, no MRG Lungs:  Improved BS left Abdomen:  Soft, non-distended, no r/g Musculoskeletal:  No acute deformity Skin:  Intact, MMM    LABS:  CBC  Recent Labs Lab 02/01/15 0332 02/02/15 0223 02/04/15 0621  WBC 9.8 9.7 13.1*  HGB 12.6* 12.8* 13.1  HCT 39.9 41.1 40.6  PLT 339 373 346   Coag's  Recent Labs Lab 01/31/15 1200  APTT 31  INR 1.18   BMET  Recent Labs Lab 02/02/15 0223 02/03/15 0303 02/04/15 0621  NA 147* 142 149*  K 3.7 3.8 3.5  CL 103 104 107  CO2 32 28 27  BUN 32* 43* 46*  CREATININE 1.15 1.31* 1.66*  GLUCOSE 157* 353* 290*   Electrolytes  Recent Labs Lab 01/29/15 0425  01/31/15 0425 02/01/15 0332 02/02/15 0223 02/03/15 0303 02/04/15 0621  CALCIUM 9.0  < > 9.3 9.0 9.3 8.9 8.8*  MG 2.2  --  2.6*  --   --   --  2.7*  PHOS 3.0  --  4.4 3.0 3.0  --   --   < > = values in this interval not displayed. Sepsis Markers  Recent Labs Lab 01/29/15 1550 01/30/15 0330 01/31/15 0425  PROCALCITON 0.34 0.30 0.26     ABG  Recent Labs Lab 02/03/15 2050 02/03/15 2254 02/04/15 0352  PHART 7.538* 7.431 7.517*  PCO2ART 31.6* 42.6 32.3*  PO2ART 47.7* 159* 131*   Liver Enzymes  Recent Labs Lab 02/04/15 0621  AST 78*  ALT 33  ALKPHOS 59  BILITOT 0.8  ALBUMIN 2.1*     Cardiac Enzymes No results for input(s): TROPONINI, PROBNP in the last 168 hours. Glucose  Recent Labs Lab 02/03/15 0840  02/03/15 1207 02/03/15 1645 02/03/15 2009 02/04/15 0101 02/04/15 0805  GLUCAP 303* 277* 223* 268* 257* 239*   Imaging Dg Chest Port 1 View  02/04/2015  CLINICAL DATA:  Pneumonia EXAM: PORTABLE CHEST 1 VIEW COMPARISON:  Portable chest x-ray of February 03, 2015 FINDINGS: There has been marked improvement in the aeration of the left lung. No significant pleural effusion is evident. No pneumothorax is demonstrated either. The right lung is clear. The heart and pulmonary vascularity are normal. The endotracheal tube tip lies approximately 4.4 cm above the carina. The feeding tube tip cannot be clearly discerned. IMPRESSION: Marked improved aeration of the left lung consistent with resolution of atelectasis secondary to a central obstructive process. There is no significant pleural effusion and no evidence of a pneumothorax. The endotracheal tube is in reasonable position. The feeding tube tip cannot be demonstrated. An abdominal x-ray is recommended. Electronically Signed   By: David  SwazilandJordan M.D.   On: 02/04/2015 07:11   Dg Chest Port 1 View  02/03/2015  CLINICAL DATA:  Acute onset of respiratory distress. Initial encounter. EXAM: PORTABLE CHEST 1 VIEW COMPARISON:  Chest radiograph performed 02/01/2015. FINDINGS: There is near complete opacification of the left hemithorax, reflecting a relatively large left-sided pleural effusion and associated airspace opacification. There is mild leftward mediastinal shift, reflecting mild left-sided volume loss. The right lung appears relatively clear. No pneumothorax is seen. The cardiomediastinal silhouette is not well assessed due to airspace consolidation. No acute osseous abnormalities are identified. The patient's enteric tube is noted extending overlying the body of the stomach. IMPRESSION: Near-complete opacification of the left hemithorax, reflecting a relatively large left-sided pleural effusion and associated airspace opacification. Mild leftward mediastinal  shift reflects mild left-sided volume loss. This may reflect pneumonia. Would perform follow-up chest radiograph after completion of treatment, to ensure resolution of airspace opacity. Electronically Signed   By: Roanna RaiderJeffery  Chang M.D.   On: 02/03/2015 21:17   Dg Abd Portable 1v  02/03/2015  CLINICAL DATA:  Nasogastric tube placement EXAM: PORTABLE ABDOMEN - 1 VIEW COMPARISON:  Portable exam 1622 hours compared to 02/02/2015 FINDINGS: Tip of feeding tube projects over expected position of ligament of Treitz or proximal jejunum. Bowel gas pattern normal. No bowel dilatation or bowel wall thickening. IMPRESSION: Tip of feeding tube projects over either the ligament of Treitz or proximal jejunum. Electronically Signed   By: Ulyses SouthwardMark  Boles M.D.   On: 02/03/2015 16:30   ASSESSMENT / PLAN:  PULMONARY OETT 10/15 >>10/25 OETT 10/27 >> Acute respiratory failure - post cardiac arrest  Mucous plugging Aspiration vs post-obstruction pneumonitis vs recurrent VAP/HCAP Rib fractures complicating spontaneous respirations  P:   Chest PT pcxr in am and post trach No weaning as peep 10 Goal to peep 8 then 5, likely able Requires  trach abg reviewed, lowered MV  CARDIOVASCULAR CVL L IJ 10/16 >> Cardiac arrest - PEA  STEMI  RCA occlusion - s/p stenting 10/15  P:  Telemetry monitoring ASA, brilinta (hold this am ) Hold home lisinopril, tricor, HCTZ further Continue Coreg 25 BID Hold lasix  RENAL ARF > worsening suspect pre-renal hypernatremia P:   Bmet in am  IVF hydration Free water Correct electrolytes as indicated Consider add d5w  GASTROINTESTINAL Postpyloric tube 10/27 > Dysphagia  P:   PPI Failed SLP eval 10/27 Hold TF for tonight, re-eval post trach  HEMATOLOGIC DVT prevention P:  F/u CBC Lovenox coags wnl 10/24  INFECTIOUS UTI  Postobstructive pneumonitis favored vs aspiration of TF vs VAP/HCAP  P:   Urine culture 10/16 >> negative  Sputum 10/22 >>rare candida  BC  10/22 >> neg  Rocephin 10/16 >> 10/20 Vanco 10/22 >> 10/24 Zosyn 10/22 >>>     proposed stop date dc BAL 10/27 >>> Cdiff - neg  Keep empiric ABX, follow bronch bal  ENDOCRINE DM - Uncontrolled  P:   resistant scale  Hold lantus while holding TF When restart TF, resume lantus at 50.  NEUROLOGIC AMS / Hypoxic Encephalopathy  - post cardiac arrest  Fall - head CT neg   P:   RASS goal:-1 Precedex infusion, dc with drop in BP PRN fentanyl PRN versed  FAMILY  - Updates: i called steve brother and updated and re consented for trach  GLOBAL: CSW consult for financial assistance with meds  Ccm 35 min   Mcarthur Rossetti. Tyson Alias, MD, FACP Pgr: [phone] [facility]

## 2015-02-04 NOTE — Progress Notes (Signed)
Several issues. Call from bedside nurse to  Electronic ICU   1 high-grade temperature 103 despite Tylenol  2. Loose stools. C. Difficile -3 days ago. Was negative  3 thrush noticed in the mouth. EKG QTC was normal in the last few weeks   Plan  Cooling blanket  Flexiseal  IV Diflucan with QTc monitoring check 12-lead EKG tomorrow  Dr. Kalman ShanMurali Aasir Daigler, M.D., [facility] HospitalF.C.C.P Pulmonary and Critical Care Medicine Staff Physician [facility] Pager: [phone], If no answer or between  15:00h - 7:00h: call [phone]  02/04/2015 4:54 PM

## 2015-02-04 NOTE — Progress Notes (Signed)
02/04/2015-Respiratory care note- Trach tube placed by MD at bedside.  Pt tolerated procedure well.  Pt placed on 100% prior to procedure.  #8 shiley placed with bilateral breath sounds, visualization, and xray confirming placement.  Trach secured with velcro ties and sutures.  No complications noted.  Sats 100% pre and post trach placement.

## 2015-02-04 NOTE — Procedures (Signed)
Bronchoscopy  for Percutaneous  Tracheostomy  Name: Albert Jensen MRN: [id number] DOB: 04/03/1971 Procedure: Bronchoscopy for Percutaneous Tracheostomy Indications: Diagnostic evaluation of the airways In conjunction with: Dr. Tyson AliasFeinstein   Procedure Details Consent: Risks of procedure as well as the alternatives and risks of each were explained to the (patient/caregiver).  Consent for procedure obtained. Time Out: Verified patient identification, verified procedure, site/side was marked, verified correct patient position, special equipment/implants available, medications/allergies/relevent history reviewed, required imaging and test results available.  Performed  In preparation for procedure, patient was given 100% FiO2 and bronchoscope lubricated. Sedation: Benzodiazepines and Etomidate  Airway entered and the following bronchi were examined: LUL.   Procedures performed: Endotracheal Tube retracted in 2 cm increments. Cannulation of airway observed. Dilation observed. Placement of trachel tube  observed . No overt complications. Bronchoscope removed.    Evaluation Hemodynamic Status: BP stable throughout; O2 sats: stable throughout Patient's Current Condition: stable Specimens:  None Complications: No apparent complications Patient did tolerate procedure well.   Brett CanalesSteve Dannya Pitkin ACNP [facility] Pager [phone] till 3 pm If no answer page [phone] 02/04/2015, 10:11 AM

## 2015-02-04 NOTE — Care Management Note (Signed)
Case Management Note  Patient Details  Name: Albert Jensen MRN: [id number] Date of Birth: 04/27/1970  Subjective/Objective:          Readmitted to ccu w resp distress, vent and trach placed 10/28          Action/Plan: prev cm had started working on ltac. [facility] [place]    Expected Discharge Date:                  Expected Discharge Plan:  Long Term Acute Care (LTAC)  In-House Referral:     Discharge planning Services  CM Consult, Indigent Health Clinic, Medication Assistance  Post Acute Care Choice:    Choice offered to:     DME Arranged:    DME Agency:     HH Arranged:    HH Agency:     Status of Service:     Medicare Important Message Given:    Date Medicare IM Given:    Medicare IM give by:    Date Additional Medicare IM Given:    Additional Medicare Important Message give by:     If discussed at Long Length of Stay Meetings, dates discussed:    Additional Comments: will cont to follow for dc needs. Await [employer] determ if will cover select or not. Now back on vent and trach .  Hanley Haysowell, Aysel Gilchrest T, RN 02/04/2015, 9:35 AM

## 2015-02-05 ENCOUNTER — Inpatient Hospital Stay (HOSPITAL_COMMUNITY): Payer: Managed Care, Other (non HMO)

## 2015-02-05 DIAGNOSIS — G9340 Encephalopathy, unspecified: Secondary | ICD-10-CM

## 2015-02-05 DIAGNOSIS — I959 Hypotension, unspecified: Secondary | ICD-10-CM

## 2015-02-05 LAB — GLUCOSE, CAPILLARY
GLUCOSE-CAPILLARY: 231 mg/dL — AB (ref 65–99)
GLUCOSE-CAPILLARY: 247 mg/dL — AB (ref 65–99)
GLUCOSE-CAPILLARY: 264 mg/dL — AB (ref 65–99)
GLUCOSE-CAPILLARY: 375 mg/dL — AB (ref 65–99)
Glucose-Capillary: 326 mg/dL — ABNORMAL HIGH (ref 65–99)
Glucose-Capillary: 318 mg/dL — ABNORMAL HIGH (ref 65–99)
Glucose-Capillary: 311 mg/dL — ABNORMAL HIGH (ref 65–99)

## 2015-02-05 LAB — COMPREHENSIVE METABOLIC PANEL
ALK PHOS: 63 U/L (ref 38–126)
ALT: 37 U/L (ref 17–63)
ANION GAP: 8 (ref 5–15)
AST: 67 U/L — ABNORMAL HIGH (ref 15–41)
Albumin: 2 g/dL — ABNORMAL LOW (ref 3.5–5.0)
BILIRUBIN TOTAL: 0.9 mg/dL (ref 0.3–1.2)
BUN: 40 mg/dL — ABNORMAL HIGH (ref 6–20)
CALCIUM: 8 mg/dL — AB (ref 8.9–10.3)
CO2: 27 mmol/L (ref 22–32)
Chloride: 108 mmol/L (ref 101–111)
Creatinine, Ser: 1.19 mg/dL (ref 0.61–1.24)
GLUCOSE: 363 mg/dL — AB (ref 65–99)
POTASSIUM: 3.9 mmol/L (ref 3.5–5.1)
Sodium: 143 mmol/L (ref 135–145)
TOTAL PROTEIN: 6.8 g/dL (ref 6.5–8.1)

## 2015-02-05 LAB — CBC WITH DIFFERENTIAL/PLATELET
BASOS ABS: 0 10*3/uL (ref 0.0–0.1)
BASOS PCT: 0 %
Eosinophils Absolute: 0 10*3/uL (ref 0.0–0.7)
Eosinophils Relative: 0 %
HEMATOCRIT: 39 % (ref 39.0–52.0)
Hemoglobin: 12.1 g/dL — ABNORMAL LOW (ref 13.0–17.0)
Lymphocytes Relative: 9 %
Lymphs Abs: 1.5 10*3/uL (ref 0.7–4.0)
MCH: 28.3 pg (ref 26.0–34.0)
MCHC: 31 g/dL (ref 30.0–36.0)
MCV: 91.1 fL (ref 78.0–100.0)
MONO ABS: 0.8 10*3/uL (ref 0.1–1.0)
Monocytes Relative: 5 %
NEUTROS ABS: 14.4 10*3/uL — AB (ref 1.7–7.7)
NEUTROS PCT: 86 %
Platelets: 280 10*3/uL (ref 150–400)
RBC: 4.28 MIL/uL (ref 4.22–5.81)
RDW: 13.9 % (ref 11.5–15.5)
WBC: 16.7 10*3/uL — AB (ref 4.0–10.5)

## 2015-02-05 LAB — PROCALCITONIN: PROCALCITONIN: 2.11 ng/mL

## 2015-02-05 MED ORDER — INSULIN GLARGINE 100 UNIT/ML ~~LOC~~ SOLN
30.0000 [IU] | SUBCUTANEOUS | Status: DC
  Administered 2015-02-05 – 2015-02-08 (×4): 30 [IU] via SUBCUTANEOUS
  Filled 2015-02-05 (×4): qty 0.3

## 2015-02-05 MED ORDER — CARVEDILOL 12.5 MG PO TABS
12.5000 mg | ORAL_TABLET | Freq: Two times a day (BID) | ORAL | Status: DC
  Administered 2015-02-05 – 2015-02-08 (×6): 12.5 mg
  Filled 2015-02-05 (×6): qty 1

## 2015-02-05 NOTE — Progress Notes (Signed)
RT note: A.M. CPT done via BED.

## 2015-02-05 NOTE — Progress Notes (Signed)
SUBJECTIVE:  Trach in place.  Per nurses, he does follow commands and is oriented to person and time.  OBJECTIVE:   Vitals:   Filed Vitals:   02/05/15 0600 02/05/15 0700 02/05/15 0800 02/05/15 0900  BP: 112/70 120/83 105/64 87/63  Pulse: 111 115 110 88  Temp:   100.3 F (37.9 C)   TempSrc:   Core (Comment)   Resp: 22 33 20 22  Height:      Weight:      SpO2: 100% 100% 100% 100%   I&O's:   Intake/Output Summary (Last 24 hours) at 02/05/15 1007 Last data filed at 02/05/15 0900  Gross per 24 hour  Intake   3352 ml  Output   3095 ml  Net    257 ml   TELEMETRY: Reviewed telemetry pt in NSR:     PHYSICAL EXAM General: On trach Head:   Normal cephalic and atramatic  Lungs:  Coarse breath sounds bilaterally to auscultation. Heart:   HRRR S1 S2  No JVD.   Abdomen: abdomen soft     LABS: Basic Metabolic Panel:  Recent Labs  16/10/96 0621 02/05/15 0300  NA 149* 143  K 3.5 3.9  CL 107 108  CO2 27 27  GLUCOSE 290* 363*  BUN 46* 40*  CREATININE 1.66* 1.19  CALCIUM 8.8* 8.0*  MG 2.7*  --    Liver Function Tests:  Recent Labs  02/04/15 0621 02/05/15 0300  AST 78* 67*  ALT 33 37  ALKPHOS 59 63  BILITOT 0.8 0.9  PROT 6.4* 6.8  ALBUMIN 2.1* 2.0*   No results for input(s): LIPASE, AMYLASE in the last 72 hours. CBC:  Recent Labs  02/04/15 0621 02/05/15 0300  WBC 13.1* 16.7*  NEUTROABS 11.2* 14.4*  HGB 13.1 12.1*  HCT 40.6 39.0  MCV 90.8 91.1  PLT 346 280   Cardiac Enzymes: No results for input(s): CKTOTAL, CKMB, CKMBINDEX, TROPONINI in the last 72 hours. BNP: Invalid input(s): POCBNP D-Dimer: No results for input(s): DDIMER in the last 72 hours. Hemoglobin A1C: No results for input(s): HGBA1C in the last 72 hours. Fasting Lipid Panel: No results for input(s): CHOL, HDL, LDLCALC, TRIG, CHOLHDL, LDLDIRECT in the last 72 hours. Thyroid Function Tests: No results for input(s): TSH, T4TOTAL, T3FREE, THYROIDAB in the last 72 hours.  Invalid  input(s): FREET3 Anemia Panel: No results for input(s): VITAMINB12, FOLATE, FERRITIN, TIBC, IRON, RETICCTPCT in the last 72 hours. Coag Panel:   Lab Results  Component Value Date   INR 1.18 01/31/2015   INR 1.08 01/22/2015   INR 3.06* 01/22/2015    RADIOLOGY: Dg Abd 1 View  02/02/2015  CLINICAL DATA:  Nasogastric tube placement EXAM: ABDOMEN - 1 VIEW COMPARISON:  February 01, 2015 FINDINGS: Nasogastric tube tip and side port are in the distal stomach. Bowel gas pattern unremarkable. Mild atelectasis left lung base. IMPRESSION: Nasogastric tube tip and side port in distal stomach region. Bowel gas pattern unremarkable. Electronically Signed   By: Bretta Bang III M.D.   On: 02/02/2015 15:46   Ct Head Wo Contrast  01/27/2015  CLINICAL DATA:  44 year old male came in to ED with cardiac arrest 01/22/15, evaluate for encephalopathy per Dr. Molli Knock EXAM: CT HEAD WITHOUT CONTRAST TECHNIQUE: Contiguous axial images were obtained from the base of the skull through the vertex without intravenous contrast. COMPARISON:  01/22/2015 FINDINGS: There is no intra or extra-axial fluid collection or mass lesion. The basilar cisterns and ventricles have a normal appearance. There is  no CT evidence for acute infarction or hemorrhage. IMPRESSION: Negative exam. Electronically Signed   By: Norva Pavlov M.D.   On: 01/27/2015 09:24   Ct Head Wo Contrast  01/22/2015  CLINICAL DATA:  Code STEMI, status post fall, hit back of head, intubated EXAM: CT HEAD WITHOUT CONTRAST CT CERVICAL SPINE WITHOUT CONTRAST TECHNIQUE: Multidetector CT imaging of the head and cervical spine was performed following the standard protocol without intravenous contrast. Multiplanar CT image reconstructions of the cervical spine were also generated. COMPARISON:  None. FINDINGS: CT HEAD FINDINGS No evidence of parenchymal hemorrhage or extra-axial fluid collection. No mass lesion, mass effect, or midline shift. No CT evidence of acute  infarction. Cerebral volume is within normal limits.  No ventriculomegaly. Near complete opacification of the right sphenoid sinus. The mastoid air cells are unopacified. No evidence of calvarial fracture. CT CERVICAL SPINE FINDINGS Normal cervical lordosis. No evidence of fracture or dislocation. Vertebral body heights and intervertebral disc spaces are maintained. Dens appears intact. No prevertebral soft tissue swelling. IMPRESSION: Normal head CT. Normal cervical spine CT. These results were called by telephone at the time of interpretation on 01/22/2015 at 11:33 am to Dr. Eldridge Dace, who verbally acknowledged these results. Electronically Signed   By: Charline Bills M.D.   On: 01/22/2015 11:55   Ct Cervical Spine Wo Contrast  01/22/2015  CLINICAL DATA:  Code STEMI, status post fall, hit back of head, intubated EXAM: CT HEAD WITHOUT CONTRAST CT CERVICAL SPINE WITHOUT CONTRAST TECHNIQUE: Multidetector CT imaging of the head and cervical spine was performed following the standard protocol without intravenous contrast. Multiplanar CT image reconstructions of the cervical spine were also generated. COMPARISON:  None. FINDINGS: CT HEAD FINDINGS No evidence of parenchymal hemorrhage or extra-axial fluid collection. No mass lesion, mass effect, or midline shift. No CT evidence of acute infarction. Cerebral volume is within normal limits.  No ventriculomegaly. Near complete opacification of the right sphenoid sinus. The mastoid air cells are unopacified. No evidence of calvarial fracture. CT CERVICAL SPINE FINDINGS Normal cervical lordosis. No evidence of fracture or dislocation. Vertebral body heights and intervertebral disc spaces are maintained. Dens appears intact. No prevertebral soft tissue swelling. IMPRESSION: Normal head CT. Normal cervical spine CT. These results were called by telephone at the time of interpretation on 01/22/2015 at 11:33 am to Dr. Eldridge Dace, who verbally acknowledged these results.  Electronically Signed   By: Charline Bills M.D.   On: 01/22/2015 11:55   Mr Brain Wo Contrast  01/28/2015  CLINICAL DATA:  Initial evaluation for acute encephalopathy, status post cardiac arrest. EXAM: MRI HEAD WITHOUT CONTRAST TECHNIQUE: Multiplanar, multiecho pulse sequences of the brain and surrounding structures were obtained without intravenous contrast. COMPARISON:  Prior CT from 01/27/2015. FINDINGS: Study mildly degraded by motion artifact. Mild age-related cerebral volume loss present. No significant white matter disease. There is very subtle mildly increased signal intensity on DWI sequence involving the right caudate (series 3, image 31). Similarly, there is subtly increased diffusion abnormality within the bilateral hippocampi/mesial temporal lobes (series 5, image 19). Subtly increased T2/FLAIR signal intensity within these regions. Hippocampi themselves are of normal morphology and equal size. There is question of subtle involvement of the left caudate and lentiform nucleus as well. No associated hemorrhage. No acute large vessel territory infarct. Intravascular flow voids maintained. No mass lesion, midline shift, or mass effect. No hydrocephalus. No extra-axial fluid collection. Craniocervical junction within normal limits. Pituitary gland within normal limits. No acute abnormality about the orbits. Mucosal thickening within  the sphenoid sinuses, right greater than left. Scattered opacity within the bilateral mastoid air cells. Fluid within the posterior nasopharynx. Patient appears to be intubated. Bone marrow signal intensity within normal limits. Mild scalp edema, which may be related to overall volume status. IMPRESSION: Subtle diffusion abnormality within the bilateral hippocampi/mesial temporal lobes as well as the right caudate. While these findings are nonspecific, primary concern is for possible hypoxic ischemic injury given the history of prolonged cardiac arrest. Electronically  Signed   By: Rise Mu M.D.   On: 01/28/2015 01:55   Dg Chest Port 1 View  02/05/2015  CLINICAL DATA:  Pneumonia EXAM: PORTABLE CHEST 1 VIEW COMPARISON:  Radiograph 02/04/2015 FINDINGS: Tracheostomy tube and feeding tube are unchanged. Normal cardiac silhouette. There are low lung volumes. Mild central venous congestion slightly increased. No infiltrate. No pneumothorax. IMPRESSION: Low lung volumes with mild increase in central venous congestion . Stable support apparatus. Electronically Signed   By: Genevive Bi M.D.   On: 02/05/2015 07:29   Dg Chest Port 1 View  02/04/2015  CLINICAL DATA:  Respiratory failure.  Tracheostomy. EXAM: PORTABLE CHEST 1 VIEW COMPARISON:  None. FINDINGS: Tracheostomy tube is identified without gross complicating features. Small bore feeding tube entering the stomach with tip off the field of view. This is a low volume film with minimal bibasilar atelectasis again noted. The cardiomediastinal silhouette is unchanged. There is no evidence of focal airspace disease, pulmonary edema, suspicious pulmonary nodule/mass, pleural effusion, or pneumothorax. No acute bony abnormalities are identified. IMPRESSION: Tracheostomy tube placement without acute or complicating abnormalities. Minimal bibasilar atelectasis again noted. Electronically Signed   By: Harmon Pier M.D.   On: 02/04/2015 09:39   Dg Chest Port 1 View  02/04/2015  CLINICAL DATA:  Pneumonia EXAM: PORTABLE CHEST 1 VIEW COMPARISON:  Portable chest x-ray of February 03, 2015 FINDINGS: There has been marked improvement in the aeration of the left lung. No significant pleural effusion is evident. No pneumothorax is demonstrated either. The right lung is clear. The heart and pulmonary vascularity are normal. The endotracheal tube tip lies approximately 4.4 cm above the carina. The feeding tube tip cannot be clearly discerned. IMPRESSION: Marked improved aeration of the left lung consistent with resolution of  atelectasis secondary to a central obstructive process. There is no significant pleural effusion and no evidence of a pneumothorax. The endotracheal tube is in reasonable position. The feeding tube tip cannot be demonstrated. An abdominal x-ray is recommended. Electronically Signed   By: David  [place] M.D.   On: 02/04/2015 07:11   Dg Chest Port 1 View  02/03/2015  CLINICAL DATA:  Acute onset of respiratory distress. Initial encounter. EXAM: PORTABLE CHEST 1 VIEW COMPARISON:  Chest radiograph performed 02/01/2015. FINDINGS: There is near complete opacification of the left hemithorax, reflecting a relatively large left-sided pleural effusion and associated airspace opacification. There is mild leftward mediastinal shift, reflecting mild left-sided volume loss. The right lung appears relatively clear. No pneumothorax is seen. The cardiomediastinal silhouette is not well assessed due to airspace consolidation. No acute osseous abnormalities are identified. The patient's enteric tube is noted extending overlying the body of the stomach. IMPRESSION: Near-complete opacification of the left hemithorax, reflecting a relatively large left-sided pleural effusion and associated airspace opacification. Mild leftward mediastinal shift reflects mild left-sided volume loss. This may reflect pneumonia. Would perform follow-up chest radiograph after completion of treatment, to ensure resolution of airspace opacity. Electronically Signed   By: Roanna Raider M.D.   On: 02/03/2015 21:17  Dg Chest Port 1 View  02/01/2015  CLINICAL DATA:  Hypoxia EXAM: PORTABLE CHEST 1 VIEW COMPARISON:  January 31, 2015 FINDINGS: Endotracheal tube tip is 3.9 cm above the carina. Nasogastric tube tip and side port are below the diaphragm. No pneumothorax. There is atelectatic change in the left base region, stable. Lungs elsewhere clear. Heart size and pulmonary vascularity are normal. No adenopathy. IMPRESSION: Tube positions as described  without pneumothorax. Left base atelectasis is stable. No new opacity. No change in cardiac silhouette. Electronically Signed   By: Bretta Bang III M.D.   On: 02/01/2015 07:00   Dg Chest Port 1 View  01/31/2015  CLINICAL DATA:  Intubated patient. History of hypertension and diabetes. Acute myocardial infarction. EXAM: PORTABLE CHEST 1 VIEW COMPARISON:  01/30/2015. FINDINGS: 0545 hours. Endotracheal tube tip is unchanged in the mid trachea. The left IJ central venous catheter and nasogastric tube are unchanged. Left base atelectasis has minimally improved. The lungs are otherwise clear. There is no pleural effusion or pneumothorax. The heart size and mediastinal contours are stable. IMPRESSION: Interval improved left basilar aeration.  Stable support system. Electronically Signed   By: Carey Bullocks M.D.   On: 01/31/2015 09:53   Dg Chest Port 1 View  01/30/2015  CLINICAL DATA:  Recent endotracheal tube advancement EXAM: PORTABLE CHEST - 1 VIEW COMPARISON:  01/30/2015 FINDINGS: Cardiac shadow is stable. Endotracheal tube now all is seen at 4.2 cm above the carina and in satisfactory position. A nasogastric catheter is noted in the stomach. A left jugular central line is again seen and stable. No new focal infiltrate or sizable effusion is noted. Minimal left basilar atelectasis remains. IMPRESSION: Endotracheal to further within the trachea as described. Persistent left basilar atelectasis. Electronically Signed   By: Alcide Clever M.D.   On: 01/30/2015 19:37   Dg Chest Port 1 View  01/30/2015  CLINICAL DATA:  Acute respiratory failure EXAM: PORTABLE CHEST 1 VIEW COMPARISON:  01/30/2015 FINDINGS: Endotracheal tube terminates 6.5 cm above the carina. Mild left basilar atelectasis. No focal consolidation. No pleural effusion or pneumothorax. The heart is normal in size. IMPRESSION: Endotracheal tube terminates 6.5 cm above the carina. Electronically Signed   By: Charline Bills M.D.   On: 01/30/2015  13:02   Dg Chest Port 1 View  01/30/2015  CLINICAL DATA:  Acute respiratory failure, hypertension, diabetes mellitus EXAM: PORTABLE CHEST 1 VIEW COMPARISON:  Portable exam 0446 hours compared to 01/29/2015 FINDINGS: Rotated to the LEFT. Nasogastric tube extends into stomach. LEFT jugular central venous catheter tip projects over whole LEFT brachiocephalic vein near SVC confluence. Questionable visualization of an endotracheal tube at the thoracic inlet approximately 8.6 cm above carina. Stable heart size mediastinal contours for degree of rotation. Bibasilar atelectasis versus infiltrate, slightly increased. Superimposed artifacts without definite upper lobe infiltrate or pneumothorax. IMPRESSION: Probable endotracheal tube tip at thoracic inlet 8.6 cm above carina ; this can be advanced 4 cm if desired for more stable positioning in the thorax trachea. Increased bibasilar atelectasis versus infiltrate. Electronically Signed   By: Ulyses Southward M.D.   On: 01/30/2015 07:34   Dg Chest Port 1 View  01/30/2015  CLINICAL DATA:  Endotracheal tube placement.  Initial encounter. EXAM: PORTABLE CHEST 1 VIEW COMPARISON:  Chest radiograph performed earlier today at 4:46 a.m. FINDINGS: The patient's endotracheal tube is seen ending 4-5 cm above the carina. An enteric tube is noted extending below the diaphragm. A left IJ line is noted ending about the mid SVC. The  lungs are mildly hypoexpanded. Mild vascular congestion is noted. Mild left basilar airspace opacity may reflect atelectasis or possibly mild pneumonia. No pleural effusion or pneumothorax is seen. The cardiomediastinal silhouette is normal in size. No acute osseous abnormalities are seen. IMPRESSION: 1. Endotracheal tube seen ending 4-5 cm above the carina. 2. Lungs mildly hypoexpanded. Mild vascular congestion noted. Mild left basilar airspace opacity may reflect atelectasis or possibly mild pneumonia. Electronically Signed   By: Roanna RaiderJeffery  Chang M.D.   On:  01/30/2015 07:05   Dg Chest Port 1 View  01/29/2015  CLINICAL DATA:  44 year old male with endotracheal tube replacement. EXAM: PORTABLE CHEST 1 VIEW COMPARISON:  01/29/2015 and prior exam FINDINGS: An endotracheal tube is present with tip 6 cm above the carina. Left IJ central venous catheter is again noted with tip overlying the upper SVC. An NG tube is present cold in the proximal stomach. This is a low volume film with left basilar atelectasis again noted. There is no evidence of pneumothorax. The cardiomediastinal silhouette is unchanged. IMPRESSION: Endotracheal tube with tip 6 cm above the carina. Low volume film with continued left basilar atelectasis. Electronically Signed   By: Harmon PierJeffrey  Hu M.D.   On: 01/29/2015 21:59   Dg Chest Port 1 View  01/29/2015  CLINICAL DATA:  Intubated.  Shortness of breath. EXAM: PORTABLE CHEST 1 VIEW COMPARISON:  01/28/2015 FINDINGS: Endotracheal tube remains in place with tip at the level of the clavicular heads. Left internal jugular central venous catheter is unchanged. Enteric tube courses into the left upper abdomen, incompletely imaged. Cardiomediastinal silhouette is unchanged and within normal limits. Lung volumes are slightly lower than on the prior study with mildly increased left basilar opacity. No sizable pleural effusion or pneumothorax is identified. IMPRESSION: Increased left basilar atelectasis. Electronically Signed   By: Sebastian AcheAllen  Grady M.D.   On: 01/29/2015 07:52   Dg Chest Port 1 View  01/28/2015  CLINICAL DATA:  Acute respiratory failure with hypoxia. Cardiac arrest. Acute myocardial infarction. EXAM: PORTABLE CHEST 1 VIEW COMPARISON:  01/27/2015 and 01/26/2015 FINDINGS: Endotracheal tube and NG tube and central line all appear in good position, unchanged. Heart size and pulmonary vascularity are normal. Minimal atelectasis at the left lung base. IMPRESSION: Minimal atelectasis at the left lung base. Electronically Signed   By: Francene BoyersJames  Maxwell M.D.    On: 01/28/2015 07:29   Dg Chest Port 1 View  01/27/2015  CLINICAL DATA:  Shortness of breath. EXAM: PORTABLE CHEST 1 VIEW COMPARISON:  01/26/2015 . FINDINGS: Endotracheal tube, NG tube, left IJ line in stable position. Cardiomegaly with normal pulmonary vascularity. Low lung volumes with bibasilar atelectasis and/or infiltrates or persistent. No pleural effusion pneumothorax. IMPRESSION: 1. Lines and tubes in stable position. 2. Persistent unchanged low lung volumes with bibasilar atelectasis and/or infiltrates. 3. Stable cardiomegaly. No pulmonary venous congestion on today's exam . Electronically Signed   By: Maisie Fushomas  Register   On: 01/27/2015 07:20   Dg Chest Port 1 View  01/26/2015  CLINICAL DATA:  Intubation. EXAM: PORTABLE CHEST 1 VIEW COMPARISON:  01/25/2015. FINDINGS: Endotracheal tube and NG tube in good anatomic position. Cardiomegaly with mild pulmonary venous congestion. Persistent but improving bilateral basilar atelectasis and/or infiltrates. No pleural effusion or pneumothorax. IMPRESSION: 1. Lines and tubes stable position. 2. Cardiomegaly with mild pulmonary venous congestion. 3. Low lung volumes with interim partial clearing of bibasilar atelectasis and/or infiltrates. Electronically Signed   By: Maisie Fushomas  Register   On: 01/26/2015 07:15   [id number]  01/25/2015  CLINICAL DATA:  Intubation. EXAM: PORTABLE CHEST 1 VIEW COMPARISON:  01/23/2015. FINDINGS: Endotracheal tube, NG tube, left IJ line in stable position. Heart size stable. Mild pulmonary venous congestion. Low lung volumes with bibasilar atelectasis. No pleural effusion or pneumothorax . IMPRESSION: 1. Lines and tubes in stable position. 2. Mild pulmonary venous congestion.  Heart size stable. 3. Low lung volumes with mild bibasilar atelectasis. Electronically Signed   By: Maisie Fus  Register   On: 01/25/2015 07:47   Dg Chest Port 1 View  01/23/2015  CLINICAL DATA:  Encounter for central line placement. Hypertension  diabetes. EXAM: PORTABLE CHEST 1 VIEW COMPARISON:  Earlier today at 0500 hours. FINDINGS: 1346 hours. Endotracheal tube terminates 2.9 cm above carina. Nasogastric extends beyond the inferior aspect of the film. Left internal jugular line tip at low SVC. Mildly degraded exam due to AP portable technique and patient body habitus. Normal heart size for level of inspiration. Left costophrenic angle minimally excluded. No pleural effusion or pneumothorax. No congestive failure. Low lung volumes with resultant pulmonary interstitial prominence. IMPRESSION: Left internal jugular line appropriately positioned, without pneumothorax. Low lung volumes, without acute disease. Electronically Signed   By: Jeronimo Greaves M.D.   On: 01/23/2015 14:17   Dg Chest Port 1 View  01/23/2015  CLINICAL DATA:  Respiratory failure.  Hypertension and diabetes. EXAM: PORTABLE CHEST 1 VIEW COMPARISON:  01/22/2015 FINDINGS: Endotracheal tube terminates 2.6 cm above carina. Nasogastric extends beyond the inferior aspect of the film. Numerous leads and wires project over the chest. Cardiomegaly accentuated by AP portable technique. No pleural effusion or pneumothorax. Low lung volumes with resultant pulmonary interstitial prominence. No congestive failure. No lobar consolidation. IMPRESSION: Similar appearance of cardiomegaly and low lung volumes. No acute findings. Electronically Signed   By: Jeronimo Greaves M.D.   On: 01/23/2015 08:01   Dg Chest Portable 1 View  01/22/2015  CLINICAL DATA:  Cardiac arrest, status post intubation EXAM: PORTABLE CHEST 1 VIEW COMPARISON:  None. FINDINGS: Endotracheal tube terminates 2 cm above the carina. Low lung volumes.  Vascular crowding. The heart is top-normal in size for inspiration. Enteric tube courses into the stomach. IMPRESSION: Endotracheal tube terminates 2 cm above the carina. Low lung volumes with vascular crowding. Electronically Signed   By: Charline Bills M.D.   On: 01/22/2015 11:46   Dg  Abd Portable 1v  02/03/2015  CLINICAL DATA:  Nasogastric tube placement EXAM: PORTABLE ABDOMEN - 1 VIEW COMPARISON:  Portable exam 1622 hours compared to 02/02/2015 FINDINGS: Tip of feeding tube projects over expected position of ligament of Treitz or proximal jejunum. Bowel gas pattern normal. No bowel dilatation or bowel wall thickening. IMPRESSION: Tip of feeding tube projects over either the ligament of Treitz or proximal jejunum. Electronically Signed   By: Ulyses Southward M.D.   On: 02/03/2015 16:30   Dg Abd Portable 1v  02/01/2015  CLINICAL DATA:  Orogastric tube placement.  Initial encounter. EXAM: PORTABLE ABDOMEN - 1 VIEW COMPARISON:  None. FINDINGS: The patient's orogastric tube is noted ending overlying the antrum of the stomach. The visualized bowel gas pattern is grossly unremarkable, though not fully assessed. No free intra-abdominal air is seen, though evaluation is limited on a single supine view. No acute osseous abnormalities are identified. IMPRESSION: Orogastric tube noted ending overlying the antrum of the stomach. Electronically Signed   By: Roanna Raider M.D.   On: 02/01/2015 05:22      ASSESSMENT: Roosvelt Harps:    S/p inferior MI: preserved EF.  Hypotension prevented Coreg being given yesterday.  COuld decrease dose back to 12.5 mg BID for now until BP stabilizes.  It was increased a few days ago.    Corky Crafts, MD  02/05/2015  10:07 AM

## 2015-02-05 NOTE — Progress Notes (Signed)
PULMONARY / CRITICAL CARE MEDICINE   Name: Albert Jensen MRN: [id number] DOB: 05/18/1970    ADMISSION DATE:  01/22/2015 CONSULTATION DATE:  10/15  REFERRING MD :  Isabel CapriceVaranassi   CHIEF COMPLAINT:  Cardiac arrest   INITIAL PRESENTATION: 44 y/o male with hx HTN, DM presented 10/15 after witnessed arrest.  Initial rhythm PEA on EMS arrival with approx 25 mins CPR before ROSC (15 mins bystander and 10 mins with EMS). EKG concerning for STEMI, after negative head CT he was taken urgently to cath lab and PCCM consulted for vent management/hypothermia protocol. Underwent cardiac catheterization RCA showed mid RCA 99% lesion. Underwent thrombectomy, PCI/Synergy stent. Hospital course altered mental status after cardiac arrest with neurology services consulted. EEG showed no seizure activity suspect hypoxic encephalopathy. Treated with Zosyn Vanc & for suspect HCAP. He was x-rayed at 10/25, and transferred to floor 10/26. 10/27 had acute respiratory status decompensation requiring intubation, thick secretions with mucous plugs identified.  STUDIES:  CT head/c-spine 10/15 >> neg for acute process ECHO 10/18 >> LV mild dilation, EF 65-70%, no RWMA, normal PA pressure 10/21 EEG >nabnormal EEG with findings consistent with a severe encephalopathy, non specific as to cause. No electrographic seizures noted. Clinical correlation is advised.   SIGNIFICANT EVENTS: 10/15  Admit after witnessed cardiac arrest.  To cath lab rca stent 10/25 extubated 10/27 intubated, back to ICU, mucous plugging left main collapse   SUBJECTIVE / Interval events:  S/p tracheostomy 10/28 Following commands, denies pain  VITAL SIGNS: Temp:  [99.8 F (37.7 C)-103 F (39.4 C)] 99.8 F (37.7 C) (10/29 0400) Pulse Rate:  [91-121] 115 (10/29 0700) Resp:  [0-42] 33 (10/29 0700) BP: (80-125)/(59-97) 120/83 mmHg (10/29 0700) SpO2:  [87 %-100 %] 100 % (10/29 0700) FiO2 (%):  [40 %-50 %] 40 % (10/29 0329) Weight:  [108.9 kg (240 lb  1.3 oz)] 108.9 kg (240 lb 1.3 oz) (10/29 0449)  HEMODYNAMICS:    VENTILATOR SETTINGS: Vent Mode:  [-] PRVC FiO2 (%):  [40 %-50 %] 40 % Set Rate:  [12 bmp] 12 bmp Vt Set:  [650 mL] 650 mL PEEP:  [5 cmH20] 5 cmH20 Plateau Pressure:  [17 cmH20] 17 cmH20 INTAKE / OUTPUT:  Intake/Output Summary (Last 24 hours) at 02/05/15 0730 Last data filed at 02/05/15 0700  Gross per 24 hour  Intake 5227.25 ml  Output   3095 ml  Net 2132.25 ml   PHYSICAL EXAMINATION: General:  Obese male on vent Neuro:  rass 0 , weak all ext, perrl HEENT:  PERRL Cardiovascular:  RRR, no MRG Lungs:   Abdomen:  Soft, non-distended, no r/g Musculoskeletal:  No acute deformity Skin:  Intact, MMM    LABS:  CBC  Recent Labs Lab 02/02/15 0223 02/04/15 0621 02/05/15 0300  WBC 9.7 13.1* 16.7*  HGB 12.8* 13.1 12.1*  HCT 41.1 40.6 39.0  PLT 373 346 280   Coag's  Recent Labs Lab 01/31/15 1200  APTT 31  INR 1.18   BMET  Recent Labs Lab 02/03/15 0303 02/04/15 0621 02/05/15 0300  NA 142 149* 143  K 3.8 3.5 3.9  CL 104 107 108  CO2 28 27 27   BUN 43* 46* 40*  CREATININE 1.31* 1.66* 1.19  GLUCOSE 353* 290* 363*   Electrolytes  Recent Labs Lab 01/31/15 0425 02/01/15 0332 02/02/15 0223 02/03/15 0303 02/04/15 0621 02/05/15 0300  CALCIUM 9.3 9.0 9.3 8.9 8.8* 8.0*  MG 2.6*  --   --   --  2.7*  --   PHOS  4.4 3.0 3.0  --   --   --    Sepsis Markers  Recent Labs Lab 01/29/15 1550 01/30/15 0330 01/31/15 0425  PROCALCITON 0.34 0.30 0.26     ABG  Recent Labs Lab 02/03/15 2050 02/03/15 2254 02/04/15 0352  PHART 7.538* 7.431 7.517*  PCO2ART 31.6* 42.6 32.3*  PO2ART 47.7* 159* 131*   Liver Enzymes  Recent Labs Lab 02/04/15 0621 02/05/15 0300  AST 78* 67*  ALT 33 37  ALKPHOS 59 63  BILITOT 0.8 0.9  ALBUMIN 2.1* 2.0*     Cardiac Enzymes No results for input(s): TROPONINI, PROBNP in the last 168 hours. Glucose  Recent Labs Lab 02/04/15 0805 02/04/15 1131  02/04/15 1551 02/04/15 1949 02/04/15 2336 02/05/15 0314  GLUCAP 239* 212* 254* 323* 375* 326*   Imaging Dg Chest Port 1 View  02/04/2015  CLINICAL DATA:  Respiratory failure.  Tracheostomy. EXAM: PORTABLE CHEST 1 VIEW COMPARISON:  None. FINDINGS: Tracheostomy tube is identified without gross complicating features. Small bore feeding tube entering the stomach with tip off the field of view. This is a low volume film with minimal bibasilar atelectasis again noted. The cardiomediastinal silhouette is unchanged. There is no evidence of focal airspace disease, pulmonary edema, suspicious pulmonary nodule/mass, pleural effusion, or pneumothorax. No acute bony abnormalities are identified. IMPRESSION: Tracheostomy tube placement without acute or complicating abnormalities. Minimal bibasilar atelectasis again noted. Electronically Signed   By: Harmon Pier M.D.   On: 02/04/2015 09:39   ASSESSMENT / PLAN:  PULMONARY OETT 10/15 >>10/25 OETT 10/27 >> 10/28 Trach 10/28 (DF) >>  Acute respiratory failure - post cardiac arrest  Mucous plugging Aspiration vs post-obstruction pneumonitis vs VAP/HCAP Rib fractures complicating spontaneous respirations  P:   Chest PT Follow CXR Weaned PEEP to 5  Start PSV trials 10/29, possibly ATC 10/30  CARDIOVASCULAR CVL L IJ 10/16 >> Cardiac arrest - PEA  STEMI  RCA occlusion - s/p stenting 10/15  P:  Telemetry monitoring ASA, brilinta Hold home lisinopril, tricor, HCTZ further Continue Coreg 25 BID Hold lasix  RENAL ARF, ATN; improving Hypernatremia, resolved Hypokalemia P:   Follow BMP Free water; d/c d5w Correct electrolytes as indicated  GASTROINTESTINAL Postpyloric tube 10/27 > Dysphagia P:   PPI TF's   HEMATOLOGIC DVT prevention P:  F/u CBC Lovenox  INFECTIOUS UTI  Postobstructive pneumonitis favored vs aspiration of TF vs VAP/HCAP Thrush P:   Urine culture 10/16 >> negative  Urine cx 10/22 >> negative Sputum 10/22 >>rare  candida  BC 10/22 >> negative BAL 10/27 >>> not sent Blood cx 10/28 >>  Resp 10/29 >> few S aureus >>  Cdiff 10/25 >> Negative    Rocephin 10/16 >> 10/20 Vanco 10/22 >> 10/24 Zosyn 10/22 >>> 10/28 Imipenem 10/28 (empiric) >>  vanco 10/28 (empiric) >>  Fluconazole 10/28 (thrush ) >>   Empiric abx, changed 10/28. Remains febrile.  Fluconazole added for thrush 10/28. Plan 3 days.  Check Pct 10/29  ENDOCRINE DM - Uncontrolled  P:   resistant scale  lantus 25u > increase to 30u D/c d5w   NEUROLOGIC AMS / Hypoxic Encephalopathy  - post cardiac arrest  Fall - head CT neg   P:   RASS goal:-1 PRN fentanyl PRN versed  FAMILY  - Updates: Dr Tyson Alias called steve brother and updated and re consented for trach 10/28  GLOBAL: CSW consult for financial assistance with meds  Independent CC time 35 minutes   Levy Pupa, MD, PhD 02/05/2015, 7:42 AM [facility] [phone] or if no answer [phone]

## 2015-02-06 LAB — GLUCOSE, CAPILLARY
GLUCOSE-CAPILLARY: 276 mg/dL — AB (ref 65–99)
GLUCOSE-CAPILLARY: 283 mg/dL — AB (ref 65–99)
GLUCOSE-CAPILLARY: 286 mg/dL — AB (ref 65–99)
Glucose-Capillary: 245 mg/dL — ABNORMAL HIGH (ref 65–99)
Glucose-Capillary: 289 mg/dL — ABNORMAL HIGH (ref 65–99)
Glucose-Capillary: 262 mg/dL — ABNORMAL HIGH (ref 65–99)

## 2015-02-06 LAB — CULTURE, RESPIRATORY

## 2015-02-06 LAB — CBC
HEMATOCRIT: 33 % — AB (ref 39.0–52.0)
HEMOGLOBIN: 10.4 g/dL — AB (ref 13.0–17.0)
MCH: 28.6 pg (ref 26.0–34.0)
MCHC: 31.5 g/dL (ref 30.0–36.0)
MCV: 90.7 fL (ref 78.0–100.0)
Platelets: 283 10*3/uL (ref 150–400)
RBC: 3.64 MIL/uL — AB (ref 4.22–5.81)
RDW: 13.7 % (ref 11.5–15.5)
WBC: 8 10*3/uL (ref 4.0–10.5)

## 2015-02-06 LAB — BASIC METABOLIC PANEL
ANION GAP: 11 (ref 5–15)
BUN: 41 mg/dL — ABNORMAL HIGH (ref 6–20)
CHLORIDE: 111 mmol/L (ref 101–111)
CO2: 25 mmol/L (ref 22–32)
Calcium: 8.3 mg/dL — ABNORMAL LOW (ref 8.9–10.3)
Creatinine, Ser: 1.04 mg/dL (ref 0.61–1.24)
GFR calc non Af Amer: >60 mL/min (ref >60)
Glucose, Bld: 281 mg/dL — ABNORMAL HIGH (ref 65–99)
POTASSIUM: 2.8 mmol/L — AB (ref 3.5–5.1)
Sodium: 147 mmol/L — ABNORMAL HIGH (ref 135–145)

## 2015-02-06 LAB — CULTURE, RESPIRATORY W GRAM STAIN

## 2015-02-06 LAB — PROCALCITONIN: PROCALCITONIN: 1.96 ng/mL

## 2015-02-06 MED ORDER — POTASSIUM CHLORIDE 20 MEQ/15ML (10%) PO SOLN
40.0000 meq | ORAL | Status: AC
  Administered 2015-02-06 (×2): 40 meq via ORAL
  Filled 2015-02-06 (×2): qty 30

## 2015-02-06 MED ORDER — POTASSIUM CHLORIDE 20 MEQ PO PACK
40.0000 meq | PACK | ORAL | Status: DC
  Filled 2015-02-06 (×2): qty 2

## 2015-02-06 NOTE — Progress Notes (Signed)
Patient ID: Albert MarylandRicky Shaff, male   DOB: 08/12/1970, 44 y.o.   MRN: [id number]    Dr Maryruth HancockVaranassi's note reviewed from yesterday. No new cardiac recs today, please call with questions.    Dominga FerryJ Sheetal Lyall MD

## 2015-02-06 NOTE — Progress Notes (Signed)
PULMONARY / CRITICAL CARE MEDICINE   Name: Albert Jensen MRN: [id number] DOB: 10/31/1970    ADMISSION DATE:  01/22/2015 CONSULTATION DATE:  10/15  REFERRING MD :  Isabel CapriceVaranassi   CHIEF COMPLAINT:  Cardiac arrest   INITIAL PRESENTATION: 44 y/o male with hx HTN, DM presented 10/15 after witnessed arrest.  Initial rhythm PEA on EMS arrival with approx 25 mins CPR before ROSC (15 mins bystander and 10 mins with EMS). EKG concerning for STEMI, after negative head CT he was taken urgently to cath lab and PCCM consulted for vent management/hypothermia protocol. Underwent cardiac catheterization RCA showed mid RCA 99% lesion. Underwent thrombectomy, PCI/Synergy stent. Hospital course altered mental status after cardiac arrest with neurology services consulted. EEG showed no seizure activity suspect hypoxic encephalopathy. Treated with Zosyn Vanc & for suspect HCAP. He was x-rayed at 10/25, and transferred to floor 10/26. 10/27 had acute respiratory status decompensation requiring intubation, thick secretions with mucous plugs identified.  STUDIES:  CT head/c-spine 10/15 >> neg for acute process ECHO 10/18 >> LV mild dilation, EF 65-70%, no RWMA, normal PA pressure 10/21 EEG >nabnormal EEG with findings consistent with a severe encephalopathy, non specific as to cause. No electrographic seizures noted. Clinical correlation is advised.   SIGNIFICANT EVENTS: 10/15  Admit after witnessed cardiac arrest.  To cath lab rca stent 10/25 extubated 10/27 intubated, back to ICU, mucous plugging left main collapse   SUBJECTIVE / Interval events:  Tolerating PSV 10 at this time,   VITAL SIGNS: Temp:  [98.9 F (37.2 C)-102 F (38.9 C)] 101.3 F (38.5 C) (10/30 0800) Pulse Rate:  [84-103] 100 (10/30 0800) Resp:  [11-24] 19 (10/30 0800) BP: (84-112)/(54-76) 99/68 mmHg (10/30 0800) SpO2:  [100 %] 100 % (10/30 0800) FiO2 (%):  [30 %-40 %] 30 % (10/30 0800) Weight:  [106.3 kg (234 lb 5.6 oz)] 106.3 kg (234 lb  5.6 oz) (10/30 0355)  HEMODYNAMICS:    VENTILATOR SETTINGS: Vent Mode:  [-] PSV;CPAP FiO2 (%):  [30 %-40 %] 30 % Set Rate:  [12 bmp] 12 bmp Vt Set:  [650 mL] 650 mL PEEP:  [5 cmH20] 5 cmH20 Pressure Support:  [10 cmH20] 10 cmH20 Plateau Pressure:  [15 cmH20-18 cmH20] 18 cmH20 INTAKE / OUTPUT:  Intake/Output Summary (Last 24 hours) at 02/06/15 0921 Last data filed at 02/06/15 0800  Gross per 24 hour  Intake   2085 ml  Output   2125 ml  Net    -40 ml   PHYSICAL EXAMINATION: General:  Obese male on vent Neuro:  rass 0 , weak all ext, perrl, follows commands, strong cough HEENT:  PERRL Cardiovascular:  RRR, no MRG Lungs:  Coarse BS bilaterally Abdomen:  Soft, non-distended, no r/g Musculoskeletal:  No acute deformity Skin:  Intact, MMM   LABS: CBC  Recent Labs Lab 02/04/15 0621 02/05/15 0300 02/06/15 0305  WBC 13.1* 16.7* 8.0  HGB 13.1 12.1* 10.4*  HCT 40.6 39.0 33.0*  PLT 346 280 283   Coag's  Recent Labs Lab 01/31/15 1200  APTT 31  INR 1.18   BMET  Recent Labs Lab 02/04/15 0621 02/05/15 0300 02/06/15 0305  NA 149* 143 147*  K 3.5 3.9 2.8*  CL 107 108 111  CO2 27 27 25   BUN 46* 40* 41*  CREATININE 1.66* 1.19 1.04  GLUCOSE 290* 363* 281*   Electrolytes  Recent Labs Lab 01/31/15 0425 02/01/15 0332 02/02/15 0223  02/04/15 0621 02/05/15 0300 02/06/15 0305  CALCIUM 9.3 9.0 9.3  < > 8.8*  8.0* 8.3*  MG 2.6*  --   --   --  2.7*  --   --   PHOS 4.4 3.0 3.0  --   --   --   --   < > = values in this interval not displayed. Sepsis Markers  Recent Labs Lab 01/31/15 0425 02/05/15 0930 02/06/15 0305  PROCALCITON 0.26 2.11 1.96     ABG  Recent Labs Lab 02/03/15 2050 02/03/15 2254 02/04/15 0352  PHART 7.538* 7.431 7.517*  PCO2ART 31.6* 42.6 32.3*  PO2ART 47.7* 159* 131*   Liver Enzymes  Recent Labs Lab 02/04/15 0621 02/05/15 0300  AST 78* 67*  ALT 33 37  ALKPHOS 59 63  BILITOT 0.8 0.9  ALBUMIN 2.1* 2.0*     Cardiac  Enzymes No results for input(s): TROPONINI, PROBNP in the last 168 hours. Glucose  Recent Labs Lab 02/05/15 0735 02/05/15 1205 02/05/15 1603 02/05/15 1953 02/05/15 2344 02/06/15 0401  GLUCAP 318* 311* 231* 264* 247* 276*   Imaging No results found. ASSESSMENT / PLAN:  PULMONARY OETT 10/15 >>10/25 OETT 10/27 >> 10/28 Trach 10/28 (DF) >>  Acute respiratory failure - post cardiac arrest  Mucous plugging Aspiration vs post-obstruction pneumonitis vs VAP/HCAP Rib fractures complicating spontaneous respirations  P:   Chest PT Follow CXR Weaned PEEP to 5  Start PSV trials 10/29, possibly ATC 10/30  CARDIOVASCULAR CVL L IJ 10/16 >> Cardiac arrest - PEA  STEMI  RCA occlusion - s/p stenting 10/15  P:  Telemetry monitoring ASA, brilinta Hold home lisinopril, tricor, HCTZ further Coreg decreased to 12.5mg  bid on 12/29 Hold lasix  RENAL ARF, ATN; improving Hypernatremia, resolved Hypokalemia P:   Follow BMP Free water; d/c d5w Correct electrolytes as indicated  GASTROINTESTINAL Postpyloric tube 10/27 > Dysphagia P:   PPI TF's   HEMATOLOGIC DVT prevention P:  F/u CBC Lovenox  INFECTIOUS UTI  Postobstructive pneumonitis favored vs aspiration of TF vs VAP/HCAP Thrush P:   Urine culture 10/16 >> negative  Urine cx 10/22 >> negative Sputum 10/22 >>rare candida  BC 10/22 >> negative BAL 10/27 >>> not sent Blood cx 10/28 >>  Resp 10/29 >> few S aureus >> MSSA Cdiff 10/25 >> Negative    Rocephin 10/16 >> 10/20 Vanco 10/22 >> 10/24 Zosyn 10/22 >>> 10/28 meropenem 10/28 (empiric) >>  vanco 10/28 (empiric) >> 10/30 Fluconazole 10/28 (thrush ) >> (10/31 planned)  Empiric abx, changed 10/28. Stop vanco on 10/30 based on cx's, likely narrow meropenem on 10/31 if stable Fluconazole added for thrush 10/28. Plan 3 days.  Follow Pct to guide abx rx  ENDOCRINE DM - Uncontrolled  P:   resistant scale  lantus 30u D/c'd d5w   NEUROLOGIC AMS /  Hypoxic Encephalopathy  - post cardiac arrest  Fall - head CT neg   P:   RASS goal: 0 PRN fentanyl PRN versed  FAMILY  - Updates: Dr Tyson Alias called steve brother and updated and re consented for trach 10/28  GLOBAL: CSW consult for financial assistance with meds  Independent CC time 31 minutes   Levy Pupa, MD, PhD 02/06/2015, 9:21 AM [facility] [phone] or if no answer [phone]

## 2015-02-07 LAB — PROTIME-INR
INR: 1.27 (ref 0.00–1.49)
PROTHROMBIN TIME: 16 s — AB (ref 11.6–15.2)

## 2015-02-07 LAB — PROCALCITONIN: PROCALCITONIN: 1.25 ng/mL

## 2015-02-07 LAB — BASIC METABOLIC PANEL
Anion gap: 11 (ref 5–15)
BUN: 36 mg/dL — AB (ref 6–20)
CO2: 24 mmol/L (ref 22–32)
Calcium: 8.6 mg/dL — ABNORMAL LOW (ref 8.9–10.3)
Chloride: 115 mmol/L — ABNORMAL HIGH (ref 101–111)
Creatinine, Ser: 0.96 mg/dL (ref 0.61–1.24)
GFR calc Af Amer: >60 mL/min (ref >60)
GLUCOSE: 333 mg/dL — AB (ref 65–99)
Potassium: 3.8 mmol/L (ref 3.5–5.1)
SODIUM: 150 mmol/L — AB (ref 135–145)

## 2015-02-07 LAB — GLUCOSE, CAPILLARY
GLUCOSE-CAPILLARY: 230 mg/dL — AB (ref 65–99)
GLUCOSE-CAPILLARY: 255 mg/dL — AB (ref 65–99)
GLUCOSE-CAPILLARY: 261 mg/dL — AB (ref 65–99)
GLUCOSE-CAPILLARY: 263 mg/dL — AB (ref 65–99)
GLUCOSE-CAPILLARY: 294 mg/dL — AB (ref 65–99)
Glucose-Capillary: 292 mg/dL — ABNORMAL HIGH (ref 65–99)

## 2015-02-07 LAB — APTT: aPTT: 34 seconds (ref 24–37)

## 2015-02-07 MED ORDER — CEFAZOLIN SODIUM 1-5 GM-% IV SOLN
1.0000 g | Freq: Three times a day (TID) | INTRAVENOUS | Status: DC
  Administered 2015-02-07 – 2015-02-08 (×5): 1 g via INTRAVENOUS
  Filled 2015-02-07 (×7): qty 50

## 2015-02-07 NOTE — Progress Notes (Signed)
Inpatient Diabetes Program Recommendations  AACE/ADA: New Consensus Statement on Inpatient Glycemic Control (2015)  Target Ranges:  Prepandial:   less than 140 mg/dL      Peak postprandial:   less than 180 mg/dL (1-2 hours)      Critically ill patients:  140 - 180 mg/dL   Review of Glycemic Control  Inpatient Diabetes Program Recommendations:  Insulin - Basal: consider increasing Lantus to 40 units  Insulin - Meal Coverage: Increase tube feed coverage to 6 units Q4 Thank you  Piedad ClimesGina Rhonda Vangieson BSN, RN,CDE Inpatient Diabetes Coordinator [phone] (team pager)

## 2015-02-07 NOTE — Progress Notes (Signed)
Occupational Therapy Treatment Patient Details Name: Albert Jensen MRN: [id number] DOB: 05/30/1970 Today's Date: 02/07/2015    History of present illness 44 year old man who is brought in by EMS after suffering a cardiac arrest with CPR 15 minutes before EMS arrived--hypoxic encephalopathy. PMHx of HTN and DM   OT comments  Pt demonstrates improved sitting balance and activity tolerance.  Following one step commands consistently.  02 sats 100% on 28% FIO2 via trach collar, and HR 128 max with activity.   Follow Up Recommendations  CIR    Equipment Recommendations  None recommended by OT    Recommendations for Other Services      Precautions / Restrictions Precautions Precautions: Fall Precaution Comments: ribs detatchment from sternum due to CPR       Mobility Bed Mobility Overal bed mobility: Needs Assistance Bed Mobility: Supine to Sit;Sit to Supine     Supine to sit: Max assist;+2 for physical assistance Sit to supine: Max assist;+2 for physical assistance   General bed mobility comments: Pt able to assist with moving LEs toward EOB and with lifting shoulders from bed  Transfers                 General transfer comment: unable to attempt due to decreased activity tolerance     Balance Overall balance assessment: Needs assistance Sitting-balance support: Feet supported Sitting balance-Leahy Scale: Poor Sitting balance - Comments: Pt sat EOB x 1- mins with min A to brief periods of min guard assist.  Pt leans to Rt and requires verbal cues and tactile cues/assist to right self  Postural control: Right lateral lean     Standing balance comment: unable                    ADL Overall ADL's : Needs assistance/impaired     Grooming: Oral care;Moderate assistance;Sitting                                        Vision                 Additional Comments: Need to assess further   Perception     Praxis      Cognition    Behavior During Therapy: Flat affect Overall Cognitive Status: Impaired/Different from baseline Area of Impairment: Attention;Following commands;Problem solving   Current Attention Level: Sustained    Following Commands: Follows one step commands consistently     Problem Solving: Slow processing      Extremity/Trunk Assessment               Exercises Other Exercises Other Exercises: Pt able to touch head and reach to ~70-80* shoulder elevation x 1 each UE    Shoulder Instructions       General Comments      Pertinent Vitals/ Pain       Pain Assessment: Faces Faces Pain Scale: No hurt  Home Living                                          Prior Functioning/Environment              Frequency Min 2X/week     Progress Toward Goals  OT Goals(current goals can now be found in the care plan section)  Progress towards OT  goals: Progressing toward goals  Acute Rehab OT Goals Patient Stated Goal: Pt unable (will shake head yes and no with increased time) OT Goal Formulation: With patient/family Time For Goal Achievement: 02/16/15 Potential to Achieve Goals: Good ADL Goals Additional ADL Goal #1: Pt will actively participate in 25 mins therapeutic activity with 3 rest breaks to increase activity tolerance.  Additional ADL Goal #2: Pt will move to EOB sitting with max A  Additional ADL Goal #3: Pt will sit EOB x 15 mins with min gaurd assist while performing simple UE activity  Additional ADL Goal #4: OT will continue to assess vision and write goals as appropriate  Plan Discharge plan remains appropriate    Co-evaluation    PT/OT/SLP Co-Evaluation/Treatment: Yes Reason for Co-Treatment: Complexity of the patient's impairments (multi-system involvement)   OT goals addressed during session: Strengthening/ROM      End of Session Equipment Utilized During Treatment: Oxygen   Activity Tolerance Patient tolerated treatment well   Patient  Left in bed;with call bell/phone within reach;with nursing/sitter in room;with family/visitor present   Nurse Communication Mobility status        Time: 1610-9604 OT Time Calculation (min): 33 min  Charges: OT General Charges $OT Visit: 1 Procedure OT Treatments $Therapeutic Activity: 8-22 mins  Deliah Strehlow M 02/07/2015, 2:08 PM

## 2015-02-07 NOTE — Progress Notes (Signed)
Rehab admissions - Family at bedside.  Patient on vent and trach present this am.  RN tells me will try to get patient off vent today.  Will continue to follow for progress and participation with therapies.  Call me for questions.  [phone]

## 2015-02-07 NOTE — Progress Notes (Signed)
PULMONARY / CRITICAL CARE MEDICINE   Name: Albert Jensen MRN: [id number] DOB: [birth year]    ADMISSION DATE:  01/22/2015 CONSULTATION DATE:  10/15  REFERRING MD :  Isabel Caprice   CHIEF COMPLAINT:  Cardiac arrest   INITIAL PRESENTATION: 44 y/o male with hx HTN, DM presented 10/15 after witnessed arrest.  Initial rhythm PEA on EMS arrival with approx 25 mins CPR before ROSC (15 mins bystander and 10 mins with EMS). EKG concerning for STEMI, after negative head CT he was taken urgently to cath lab and PCCM consulted for vent management/hypothermia protocol. Underwent cardiac catheterization RCA showed mid RCA 99% lesion. Underwent thrombectomy, PCI/Synergy stent. Hospital course altered mental status after cardiac arrest with neurology services consulted. EEG showed no seizure activity suspect hypoxic encephalopathy. Treated with Zosyn Vanc & for suspect HCAP. He was x-rayed at 10/25, and transferred to floor 10/26. 10/27 had acute respiratory status decompensation requiring intubation, thick secretions with mucous plugs identified.  STUDIES:  CT head/c-spine 10/15 >> neg for acute process ECHO 10/18 >> LV mild dilation, EF 65-70%, no RWMA, normal PA pressure 10/21 EEG >nabnormal EEG with findings consistent with a severe encephalopathy, non specific as to cause. No electrographic seizures noted. Clinical correlation is advised.   SIGNIFICANT EVENTS: 10/15  Admit after witnessed cardiac arrest.  To cath lab rca stent 10/25 extubated 10/27 intubated, back to ICU, mucous plugging left main collapse 10/28 Tstomy   SUBJECTIVE / Interval events:  Tolerating PSV 10 at this time Bleeding around trach site Febrile Good UO  VITAL SIGNS: Temp:  [100.5 F (38.1 C)-101.5 F (38.6 C)] 101.5 F (38.6 C) (10/31 0712) Pulse Rate:  [94-114] 110 (10/31 0724) Resp:  [0-25] 20 (10/31 0724) BP: (86-120)/(62-81) 112/78 mmHg (10/31 0724) SpO2:  [99 %-100 %] 100 % (10/31 0724) FiO2 (%):  [30 %] 30 % (10/31  0724) Weight:  [231 lb 7.7 oz (105 kg)] 231 lb 7.7 oz (105 kg) (10/31 0338)  HEMODYNAMICS:    VENTILATOR SETTINGS: Vent Mode:  [-] PSV;CPAP FiO2 (%):  [30 %] 30 % Set Rate:  [12 bmp] 12 bmp Vt Set:  [650 mL] 650 mL PEEP:  [5 cmH20] 5 cmH20 Pressure Support:  [10 cmH20] 10 cmH20 Plateau Pressure:  [14 cmH20-17 cmH20] 17 cmH20 INTAKE / OUTPUT:  Intake/Output Summary (Last 24 hours) at 02/07/15 0852 Last data filed at 02/07/15 0700  Gross per 24 hour  Intake   2963 ml  Output   3050 ml  Net    -87 ml   PHYSICAL EXAMINATION: General:  Acutely ill  male on vent Neuro:  rass 0 , weak all ext, perrl, follows commands, strong cough HEENT:  PEERL, bleeding around trach site Cardiovascular:  RRR, no MRG Lungs:  Coarse BS bilaterally Abdomen:  Soft, non-distended, no r/g Musculoskeletal:  No acute deformity Skin:  Intact, MMM   LABS: CBC  Recent Labs Lab 02/04/15 0621 02/05/15 0300 02/06/15 0305  WBC 13.1* 16.7* 8.0  HGB 13.1 12.1* 10.4*  HCT 40.6 39.0 33.0*  PLT 346 280 283   Coag's  Recent Labs Lab 01/31/15 1200  APTT 31  INR 1.18   BMET  Recent Labs Lab 02/05/15 0300 02/06/15 0305 02/07/15 0416  NA 143 147* 150*  K 3.9 2.8* 3.8  CL 108 111 115*  CO2 BUN 40* 41* 36*  CREATININE 1.19 1.04 0.96  GLUCOSE 363* 281* 333*   Electrolytes  Recent Labs Lab 02/01/15 0332 02/02/15 0223  02/04/15 0621 02/05/15 0300  02/06/15 0305 02/07/15 0416  CALCIUM 9.0 9.3  < > 8.8* 8.0* 8.3* 8.6*  MG  --   --   --  2.7*  --   --   --   PHOS 3.0 3.0  --   --   --   --   --   < > = values in this interval not displayed. Sepsis Markers  Recent Labs Lab 02/05/15 0930 02/06/15 0305 02/07/15 0416  PROCALCITON 2.11 1.96 1.25     ABG  Recent Labs Lab 02/03/15 2050 02/03/15 2254 02/04/15 0352  PHART 7.538* 7.431 7.517*  PCO2ART 31.6* 42.6 32.3*  PO2ART 47.7* 159* 131*   Liver Enzymes  Recent Labs Lab 02/04/15 0621 02/05/15 0300  AST 78*  67*  ALT 33 37  ALKPHOS 59 63  BILITOT 0.8 0.9  ALBUMIN 2.1* 2.0*     Cardiac Enzymes No results for input(s): TROPONINI, PROBNP in the last 168 hours. Glucose  Recent Labs Lab 02/06/15 1115 02/06/15 1615 02/06/15 1934 02/06/15 2348 02/07/15 0403 02/07/15 0711  GLUCAP 289* 286* 262* 283* 255* 292*   Imaging No results found. ASSESSMENT / PLAN:  PULMONARY OETT 10/15 >>10/25 OETT 10/27 >> 10/28 Trach 10/28 (DF) >>  Acute respiratory failure - post cardiac arrest  Mucous plugging Aspiration vs post-obstruction pneumonitis vs VAP/HCAP Rib fractures complicating spontaneous respirations  P:   Chest PT ct PSV trials , progress to ATC  CARDIOVASCULAR CVL L IJ 10/16 >> out Cardiac arrest - PEA  STEMI  RCA occlusion - s/p stenting 10/15  P:  Telemetry monitoring ASA, brilinta Hold home lisinopril, tricor, HCTZ further Coreg decreased to 12.5mg  bid on 10/29 Hold lasix  RENAL ARF, ATN; improving Hypernatremia, resolved Hypokalemia P:   Follow BMP Ct Free water Correct electrolytes as indicated  GASTROINTESTINAL Postpyloric tube 10/27 > Dysphagia P:   PPI TF's at goal  HEMATOLOGIC DVT prevention P:  F/u CBC Dc Lovenox due to trach site bleed  INFECTIOUS UTI  Postobstructive pneumonitis favored vs aspiration of TF vs VAP/HCAP Thrush P:   Urine culture 10/16 >> negative  Urine cx 10/22 >> negative Sputum 10/22 >>rare candida  BC 10/22 >> negative BAL 10/27 >>> not sent Blood cx 10/28 >>  Resp 10/29 >> few S aureus >> MSSA Cdiff 10/25 >> Negative    Rocephin 10/16 >> 10/20 Vanco 10/22 >> 10/24 Zosyn 10/22 >>> 10/28 meropenem 10/28 (empiric) >> 10/31 vanco 10/28 (empiric) >> 10/30 Fluconazole 10/28 (thrush ) >> (10/31 planned)  Empiric abx, changed 10/28. Stop vanco on 10/30 based on cx's, narrow to ancef - Pct decrease reassuring Fluconazole added for thrush 10/28. Plan 3 days.    ENDOCRINE DM - Uncontrolled  P:   resistant scale   lantus increase to 20 u bid     NEUROLOGIC AMS / Hypoxic Encephalopathy  - post cardiac arrest  Fall - head CT neg   P:   RASS goal: 0 PRN fentanyl Aggressive PT  FAMILY  - Updates: sister in law at bedside 10/31  GLOBAL:  Is he an lTAC candidate?  The patient is critically ill with multiple organ systems failure and requires high complexity decision making for assessment and support, frequent evaluation and titration of therapies, application of advanced monitoring technologies and extensive interpretation of multiple databases. Critical Care Time devoted to patient care services described in this note independent of APP time is 31 minutes.   Cyril Mourningakesh Camiyah Friberg MD. Tonny BollmanFCCP. [facility] Pager [phone] If no response  call [phone]    02/07/2015, 8:52 AM

## 2015-02-07 NOTE — Progress Notes (Signed)
CPT not done at this time due to patient sleeping.

## 2015-02-07 NOTE — Care Management Note (Signed)
Case Management Note  Patient Details  Name: Albert Jensen MRN: [id number] Date of Birth: 03/13/1971  Subjective/Objective:                    Action/Plan: CM received a call from Judie GrieveBryan, liaison with [facility] stating that insurance approval was received for patient to come to their facility.  CM spoke with Dr Vassie LollAlva, who states that [facility] pending medical progress and stability.  CM spoke with patient's brother Willa FraterSteven Brissett [phone] to provide update.  [facility] but shared many concerns about patient being transitioned "too fast".  CM assured patient's brother that patient would be transferred when the attending MD felt it was medically appropriate/patient was stable.  Bryan with [facility] to call patient's brother to discuss details further. CM will continue to follow for any additional discharge needs.  Expected Discharge Date:                  Expected Discharge Plan:  Long Term Acute Care (LTAC)  In-House Referral:     Discharge planning Services  CM Consult, Indigent Health Clinic, Medication Assistance  Post Acute Care Choice:    Choice offered to:  Sibling  DME Arranged:    DME Agency:     HH Arranged:    HH Agency:     Status of Service:  In process, will continue to follow  Medicare Important Message Given:    Date Medicare IM Given:    Medicare IM give by:    Date Additional Medicare IM Given:    Additional Medicare Important Message give by:     If discussed at Long Length of Stay Meetings, dates discussed:    Additional CommentsAnda Kraft:  Paula Zietz C, RN 02/07/2015, 3:13 PM [phone]

## 2015-02-07 NOTE — Progress Notes (Signed)
Physical Therapy Treatment Patient Details Name: Albert Jensen MRN: [id number] DOB: 10/08/1970 Today's Date: 02/07/2015    History of Present Illness 44 y/o male with hx HTN, DM presented 10/15 after witnessed arrest. Initial rhythm PEA on EMS arrival with approx 25 mins CPR before ROSC (15 mins bystander and 10 mins with EMS). EKG concerning for STEMI, after negative head CT he was taken urgently to cath lab and PCCM consulted for vent management/hypothermia protocol. Underwent cardiac catheterization RCA showed mid RCA 99% lesion. Underwent thrombectomy, PCI/Synergy stent. Hospital course altered mental status after cardiac arrest with neurology services consulted. EEG showed no seizure activity suspect hypoxic encephalopathy. Treated with Zosyn Vanc & for suspect HCAP. He was x-rayed at 10/25, and transferred to floor 10/26. 10/27 had acute respiratory status decompensation requiring intubation, thick secretions with mucous plugs identified.Trach 10/28.      PT Comments    Pt admitted with above diagnosis. Pt currently with functional limitations due to balance and endurance deficits. Pt with improved sitting balance today.  Tolerated sitting 10 minutes on trach collar at 28%.  Progressing.   Pt will benefit from skilled PT to increase their independence and safety with mobility to allow discharge to the venue listed below.    Follow Up Recommendations  CIR;Supervision/Assistance - 24 hour     Equipment Recommendations  Other (comment) (TBA)    Recommendations for Other Services Rehab consult     Precautions / Restrictions Precautions Precautions: Fall Precaution Comments: ribs detatchment from sternum due to CPR Restrictions Weight Bearing Restrictions: No    Mobility  Bed Mobility Overal bed mobility: Needs Assistance Bed Mobility: Supine to Sit;Sit to Supine     Supine to sit: Max assist;+2 for physical assistance Sit to supine: Max assist;+2 for physical assistance    General bed mobility comments: Pt able to assist with moving LEs toward EOB and with lifting shoulders from bed  Transfers                 General transfer comment: unable to attempt due to decreased activity tolerance   Ambulation/Gait                 Stairs            Wheelchair Mobility    Modified Rankin (Stroke Patients Only)       Balance Overall balance assessment: Needs assistance Sitting-balance support: Bilateral upper extremity supported;Feet supported Sitting balance-Leahy Scale: Poor Sitting balance - Comments: Pt sat EOB x 10 mins with min A to brief periods of min guard assist.  Pt leans to Rt and requires verbal cues and tactile cues/assist to right self .  Pt was able to reach our left UE for target.  Also kicked each LE at targets as well. As pt fatigued, right lateral lean worsened.   Postural control: Right lateral lean     Standing balance comment: unable                    Cognition Arousal/Alertness: Awake/alert Behavior During Therapy: Flat affect Overall Cognitive Status: Impaired/Different from baseline Area of Impairment: Attention;Following commands;Problem solving   Current Attention Level: Sustained   Following Commands: Follows one step commands consistently Safety/Judgement: Decreased awareness of deficits   Problem Solving: Slow processing General Comments: increased cuing with increased time to follow commands but responding better than on last treatment. Able to nod yes to correct birth month and name.    Exercises General Exercises - Lower Extremity Long Arc  Quad: AROM;Both;5 reps;Seated Other Exercises Other Exercises: Pt able to touch head and reach to ~70-80* shoulder elevation x 1 each UE     General Comments        Pertinent Vitals/Pain Pain Assessment: No/denies pain Faces Pain Scale: No hurt  HR 101-128 bpm, O2 90-100% with activity on 28% trach collar.      Home Living                       Prior Function            PT Goals (current goals can now be found in the care plan section) Acute Rehab PT Goals Patient Stated Goal: Pt unable (will shake head yes and no with increased time) Progress towards PT goals: Progressing toward goals    Frequency  Min 3X/week    PT Plan Current plan remains appropriate    Co-evaluation PT/OT/SLP Co-Evaluation/Treatment: Yes Reason for Co-Treatment: Complexity of the patient's impairments (multi-system involvement) PT goals addressed during session: Mobility/safety with mobility OT goals addressed during session: Strengthening/ROM     End of Session Equipment Utilized During Treatment: Gait belt;Other (comment) (trach collar at 28%) Activity Tolerance: Patient limited by fatigue Patient left: in bed;with call bell/phone within reach;with family/visitor present;with bed alarm set     Time: 6578-4696 PT Time Calculation (min) (ACUTE ONLY): 33 min  Charges:  $Therapeutic Activity: 8-22 mins                    G CodesBerline Jensen February 11, 2015, 3:24 PM  Albert Jensen,PT Acute Rehabilitation [phone] (pager)

## 2015-02-08 ENCOUNTER — Inpatient Hospital Stay (HOSPITAL_COMMUNITY): Payer: Managed Care, Other (non HMO)

## 2015-02-08 ENCOUNTER — Inpatient Hospital Stay
Admission: AD | Admit: 2015-02-08 | Discharge: 2015-03-11 | Disposition: A | Discharge status: Skilled Nursing Facility | Payer: Self-pay | Source: Ambulatory Visit | Attending: Internal Medicine | Admitting: Internal Medicine

## 2015-02-08 ENCOUNTER — Other Ambulatory Visit (HOSPITAL_COMMUNITY): Payer: Self-pay

## 2015-02-08 DIAGNOSIS — T797XXA Traumatic subcutaneous emphysema, initial encounter: Secondary | ICD-10-CM

## 2015-02-08 DIAGNOSIS — Z431 Encounter for attention to gastrostomy: Secondary | ICD-10-CM

## 2015-02-08 DIAGNOSIS — Z452 Encounter for adjustment and management of vascular access device: Secondary | ICD-10-CM

## 2015-02-08 DIAGNOSIS — Z4659 Encounter for fitting and adjustment of other gastrointestinal appliance and device: Secondary | ICD-10-CM

## 2015-02-08 DIAGNOSIS — Z930 Tracheostomy status: Secondary | ICD-10-CM | POA: Insufficient documentation

## 2015-02-08 DIAGNOSIS — J9690 Respiratory failure, unspecified, unspecified whether with hypoxia or hypercapnia: Secondary | ICD-10-CM

## 2015-02-08 DIAGNOSIS — J189 Pneumonia, unspecified organism: Secondary | ICD-10-CM

## 2015-02-08 DIAGNOSIS — J9601 Acute respiratory failure with hypoxia: Secondary | ICD-10-CM | POA: Insufficient documentation

## 2015-02-08 DIAGNOSIS — T17908A Unspecified foreign body in respiratory tract, part unspecified causing other injury, initial encounter: Secondary | ICD-10-CM

## 2015-02-08 DIAGNOSIS — Z931 Gastrostomy status: Secondary | ICD-10-CM

## 2015-02-08 LAB — CBC
HEMATOCRIT: 34.5 % — AB (ref 39.0–52.0)
Hemoglobin: 10.6 g/dL — ABNORMAL LOW (ref 13.0–17.0)
MCH: 28.2 pg (ref 26.0–34.0)
MCHC: 30.7 g/dL (ref 30.0–36.0)
MCV: 91.8 fL (ref 78.0–100.0)
PLATELETS: 277 10*3/uL (ref 150–400)
RBC: 3.76 MIL/uL — ABNORMAL LOW (ref 4.22–5.81)
RDW: 13.7 % (ref 11.5–15.5)
WBC: 5.9 10*3/uL (ref 4.0–10.5)

## 2015-02-08 LAB — BASIC METABOLIC PANEL
Anion gap: 8 (ref 5–15)
BUN: 27 mg/dL — AB (ref 6–20)
CO2: 27 mmol/L (ref 22–32)
CREATININE: 0.86 mg/dL (ref 0.61–1.24)
Calcium: 8.4 mg/dL — ABNORMAL LOW (ref 8.9–10.3)
Chloride: 114 mmol/L — ABNORMAL HIGH (ref 101–111)
GFR calc Af Amer: >60 mL/min (ref >60)
Glucose, Bld: 296 mg/dL — ABNORMAL HIGH (ref 65–99)
POTASSIUM: 3.7 mmol/L (ref 3.5–5.1)
Sodium: 149 mmol/L — ABNORMAL HIGH (ref 135–145)

## 2015-02-08 LAB — GLUCOSE, CAPILLARY
Glucose-Capillary: 244 mg/dL — ABNORMAL HIGH (ref 65–99)
Glucose-Capillary: 260 mg/dL — ABNORMAL HIGH (ref 65–99)
Glucose-Capillary: 238 mg/dL — ABNORMAL HIGH (ref 65–99)
Glucose-Capillary: 276 mg/dL — ABNORMAL HIGH (ref 65–99)

## 2015-02-08 MED ORDER — POTASSIUM CHLORIDE 20 MEQ/15ML (10%) PO SOLN: 20.0000 meq | Freq: Every day | ORAL | Status: DC

## 2015-02-08 MED ORDER — FENTANYL CITRATE (PF) 100 MCG/2ML IJ SOLN: 50.0000 ug | INTRAMUSCULAR | Status: DC | PRN

## 2015-02-08 MED ORDER — ATORVASTATIN CALCIUM 80 MG PO TABS: 80.0000 mg | ORAL_TABLET | Freq: Every day | ORAL | Status: DC

## 2015-02-08 MED ORDER — PANTOPRAZOLE SODIUM 40 MG PO PACK: 40.0000 mg | PACK | Freq: Every day | ORAL | Status: DC

## 2015-02-08 MED ORDER — SODIUM CHLORIDE 0.9 % IJ SOLN: 3.0000 mL | INTRAMUSCULAR | Status: DC | PRN

## 2015-02-08 MED ORDER — TICAGRELOR 90 MG PO TABS
90.0000 mg | ORAL_TABLET | Freq: Two times a day (BID) | ORAL | Status: DC
Start: 2015-02-08 — End: 2016-07-05

## 2015-02-08 MED ORDER — INSULIN ASPART 100 UNIT/ML ~~LOC~~ SOLN: 4.0000 [IU] | SUBCUTANEOUS | Status: DC

## 2015-02-08 MED ORDER — GLUCERNA 1.2 CAL PO LIQD
1000.0000 mL | ORAL | Status: DC
  Filled 2015-02-08 (×3): qty 1000

## 2015-02-08 MED ORDER — GLUCERNA 1.2 CAL PO LIQD: 80.0000 mL/h | ORAL | Status: DC

## 2015-02-08 MED ORDER — DEXTROSE-NACL 5-0.45 % IV SOLN: 40.0000 mL/h | INTRAVENOUS | Status: DC

## 2015-02-08 MED ORDER — NYSTATIN 100000 UNIT/ML MT SUSP: 5.0000 mL | Freq: Four times a day (QID) | OROMUCOSAL | Status: DC

## 2015-02-08 MED ORDER — ACETAMINOPHEN 160 MG/5ML PO SOLN: 650.0000 mg | Freq: Four times a day (QID) | ORAL | Status: DC | PRN

## 2015-02-08 MED ORDER — CEFAZOLIN SODIUM 1-5 GM-% IV SOLN: 1.0000 g | Freq: Three times a day (TID) | INTRAVENOUS | Status: AC

## 2015-02-08 MED ORDER — COLLAGENASE 250 UNIT/GM EX OINT: TOPICAL_OINTMENT | Freq: Every day | CUTANEOUS | Status: DC

## 2015-02-08 MED ORDER — SODIUM CHLORIDE 0.9 % IJ SOLN: 3.0000 mL | Freq: Two times a day (BID) | INTRAMUSCULAR | Status: DC

## 2015-02-08 MED ORDER — INSULIN GLARGINE 100 UNIT/ML ~~LOC~~ SOLN: 30.0000 [IU] | SUBCUTANEOUS | Status: DC

## 2015-02-08 MED ORDER — CARVEDILOL 12.5 MG PO TABS: 12.5000 mg | ORAL_TABLET | Freq: Two times a day (BID) | ORAL | Status: DC

## 2015-02-08 MED ORDER — HYDRALAZINE HCL 20 MG/ML IJ SOLN: 10.0000 mg | INTRAMUSCULAR | Status: DC | PRN

## 2015-02-08 MED ORDER — DEXTROSE-NACL 5-0.45 % IV SOLN
INTRAVENOUS | Status: DC
  Administered 2015-02-08: 05:00:00 via INTRAVENOUS

## 2015-02-08 MED ORDER — ASPIRIN 81 MG PO CHEW: 81.0000 mg | CHEWABLE_TABLET | Freq: Every day | ORAL | Status: DC

## 2015-02-08 MED ORDER — FREE WATER: 250.0000 mL | Status: DC

## 2015-02-08 MED ORDER — INSULIN ASPART 100 UNIT/ML ~~LOC~~ SOLN: SUBCUTANEOUS | Status: DC

## 2015-02-08 MED ORDER — LABETALOL HCL 5 MG/ML IV SOLN: 10.0000 mg | INTRAVENOUS | Status: DC | PRN

## 2015-02-08 MED ORDER — ONDANSETRON HCL 4 MG/2ML IJ SOLN: 4.0000 mg | Freq: Four times a day (QID) | INTRAMUSCULAR | Status: DC | PRN

## 2015-02-08 MED ORDER — NYSTATIN 100000 UNIT/ML MT SUSP
5.0000 mL | Freq: Four times a day (QID) | OROMUCOSAL | Status: DC
Start: 2015-02-08 — End: 2015-02-08
  Administered 2015-02-08 (×3): 500000 [IU] via ORAL
  Filled 2015-02-08 (×3): qty 5

## 2015-02-08 NOTE — Progress Notes (Addendum)
SUBJECTIVE:  Awake - currently getting respiratory treatment  OBJECTIVE:   Vitals:   Filed Vitals:   02/08/15 0408 02/08/15 0426 02/08/15 0500 02/08/15 0600  BP: 112/73 112/73 105/62 124/71  Pulse: 102 98 98   Temp:      TempSrc:      Resp: 14 16 7 20   Height:      Weight:      SpO2: 100% 100% 100%    I&O's:   Intake/Output Summary (Last 24 hours) at 02/08/15 0802 Last data filed at 02/08/15 0600  Gross per 24 hour  Intake   1201 ml  Output   3020 ml  Net  -1819 ml   TELEMETRY: Reviewed telemetry pt in sinus tachycardia     PHYSICAL EXAM General: Well developed, well nourished, in no acute distress Head: Eyes PERRLA, No xanthomas.   Normal cephalic and atramatic  Lungs:   Rhonchi anteriorly Heart:   HRRR S1 S2 Pulses are 2+ & equal. Abdomen: Bowel sounds are positive, abdomen soft and non-tender without masses  Extremities:   No clubbing, cyanosis or edema.  DP +1 Neuro: Alert and oriented X 3. Psych:  Good affect, responds appropriately   LABS: Basic Metabolic Panel:  Recent Labs  16/10/96 0416 02/08/15 0529  NA 150* 149*  K 3.8 3.7  CL 115* 114*  CO2 24 27  GLUCOSE 333* 296*  BUN 36* 27*  CREATININE 0.96 0.86  CALCIUM 8.6* 8.4*   Liver Function Tests: No results for input(s): AST, ALT, ALKPHOS, BILITOT, PROT, ALBUMIN in the last 72 hours. No results for input(s): LIPASE, AMYLASE in the last 72 hours. CBC:  Recent Labs  02/06/15 0305 02/08/15 0529  WBC 8.0 5.9  HGB 10.4* 10.6*  HCT 33.0* 34.5*  MCV 90.7 91.8  PLT 283 277   Cardiac Enzymes: No results for input(s): CKTOTAL, CKMB, CKMBINDEX, TROPONINI in the last 72 hours. BNP: Invalid input(s): POCBNP D-Dimer: No results for input(s): DDIMER in the last 72 hours. Hemoglobin A1C: No results for input(s): HGBA1C in the last 72 hours. Fasting Lipid Panel: No results for input(s): CHOL, HDL, LDLCALC, TRIG, CHOLHDL, LDLDIRECT in the last 72 hours. Thyroid Function Tests: No results for  input(s): TSH, T4TOTAL, T3FREE, THYROIDAB in the last 72 hours.  Invalid input(s): FREET3 Anemia Panel: No results for input(s): VITAMINB12, FOLATE, FERRITIN, TIBC, IRON, RETICCTPCT in the last 72 hours. Coag Panel:   Lab Results  Component Value Date   INR 1.27 02/07/2015   INR 1.18 01/31/2015   INR 1.08 01/22/2015    RADIOLOGY: Dg Abd 1 View  02/02/2015  CLINICAL DATA:  Nasogastric tube placement EXAM: ABDOMEN - 1 VIEW COMPARISON:  February 01, 2015 FINDINGS: Nasogastric tube tip and side port are in the distal stomach. Bowel gas pattern unremarkable. Mild atelectasis left lung base. IMPRESSION: Nasogastric tube tip and side port in distal stomach region. Bowel gas pattern unremarkable. Electronically Signed   By: Bretta Bang III M.D.   On: 02/02/2015 15:46   Ct Head Wo Contrast  01/27/2015  CLINICAL DATA:  44 year old male came in to ED with cardiac arrest 01/22/15, evaluate for encephalopathy per Dr. Molli Knock EXAM: CT HEAD WITHOUT CONTRAST TECHNIQUE: Contiguous axial images were obtained from the base of the skull through the vertex without intravenous contrast. COMPARISON:  01/22/2015 FINDINGS: There is no intra or extra-axial fluid collection or mass lesion. The basilar cisterns and ventricles have a normal appearance. There is no CT evidence for acute infarction or hemorrhage.  IMPRESSION: Negative exam. Electronically Signed   By: Norva Pavlov M.D.   On: 01/27/2015 09:24   Ct Head Wo Contrast  01/22/2015  CLINICAL DATA:  Code STEMI, status post fall, hit back of head, intubated EXAM: CT HEAD WITHOUT CONTRAST CT CERVICAL SPINE WITHOUT CONTRAST TECHNIQUE: Multidetector CT imaging of the head and cervical spine was performed following the standard protocol without intravenous contrast. Multiplanar CT image reconstructions of the cervical spine were also generated. COMPARISON:  None. FINDINGS: CT HEAD FINDINGS No evidence of parenchymal hemorrhage or extra-axial fluid collection. No  mass lesion, mass effect, or midline shift. No CT evidence of acute infarction. Cerebral volume is within normal limits.  No ventriculomegaly. Near complete opacification of the right sphenoid sinus. The mastoid air cells are unopacified. No evidence of calvarial fracture. CT CERVICAL SPINE FINDINGS Normal cervical lordosis. No evidence of fracture or dislocation. Vertebral body heights and intervertebral disc spaces are maintained. Dens appears intact. No prevertebral soft tissue swelling. IMPRESSION: Normal head CT. Normal cervical spine CT. These results were called by telephone at the time of interpretation on 01/22/2015 at 11:33 am to Dr. Eldridge Dace, who verbally acknowledged these results. Electronically Signed   By: Charline Bills M.D.   On: 01/22/2015 11:55   Ct Cervical Spine Wo Contrast  01/22/2015  CLINICAL DATA:  Code STEMI, status post fall, hit back of head, intubated EXAM: CT HEAD WITHOUT CONTRAST CT CERVICAL SPINE WITHOUT CONTRAST TECHNIQUE: Multidetector CT imaging of the head and cervical spine was performed following the standard protocol without intravenous contrast. Multiplanar CT image reconstructions of the cervical spine were also generated. COMPARISON:  None. FINDINGS: CT HEAD FINDINGS No evidence of parenchymal hemorrhage or extra-axial fluid collection. No mass lesion, mass effect, or midline shift. No CT evidence of acute infarction. Cerebral volume is within normal limits.  No ventriculomegaly. Near complete opacification of the right sphenoid sinus. The mastoid air cells are unopacified. No evidence of calvarial fracture. CT CERVICAL SPINE FINDINGS Normal cervical lordosis. No evidence of fracture or dislocation. Vertebral body heights and intervertebral disc spaces are maintained. Dens appears intact. No prevertebral soft tissue swelling. IMPRESSION: Normal head CT. Normal cervical spine CT. These results were called by telephone at the time of interpretation on 01/22/2015 at  11:33 am to Dr. Eldridge Dace, who verbally acknowledged these results. Electronically Signed   By: Charline Bills M.D.   On: 01/22/2015 11:55   Mr Brain Wo Contrast  01/28/2015  CLINICAL DATA:  Initial evaluation for acute encephalopathy, status post cardiac arrest. EXAM: MRI HEAD WITHOUT CONTRAST TECHNIQUE: Multiplanar, multiecho pulse sequences of the brain and surrounding structures were obtained without intravenous contrast. COMPARISON:  Prior CT from 01/27/2015. FINDINGS: Study mildly degraded by motion artifact. Mild age-related cerebral volume loss present. No significant white matter disease. There is very subtle mildly increased signal intensity on DWI sequence involving the right caudate (series 3, image 31). Similarly, there is subtly increased diffusion abnormality within the bilateral hippocampi/mesial temporal lobes (series 5, image 19). Subtly increased T2/FLAIR signal intensity within these regions. Hippocampi themselves are of normal morphology and equal size. There is question of subtle involvement of the left caudate and lentiform nucleus as well. No associated hemorrhage. No acute large vessel territory infarct. Intravascular flow voids maintained. No mass lesion, midline shift, or mass effect. No hydrocephalus. No extra-axial fluid collection. Craniocervical junction within normal limits. Pituitary gland within normal limits. No acute abnormality about the orbits. Mucosal thickening within the sphenoid sinuses, right greater than left. Scattered  opacity within the bilateral mastoid air cells. Fluid within the posterior nasopharynx. Patient appears to be intubated. Bone marrow signal intensity within normal limits. Mild scalp edema, which may be related to overall volume status. IMPRESSION: Subtle diffusion abnormality within the bilateral hippocampi/mesial temporal lobes as well as the right caudate. While these findings are nonspecific, primary concern is for possible hypoxic ischemic injury  given the history of prolonged cardiac arrest. Electronically Signed   By: Rise MuBenjamin  McClintock M.D.   On: 01/28/2015 01:55   Dg Chest Port 1 View  02/08/2015  CLINICAL DATA:  Acute respiratory failure EXAM: PORTABLE CHEST 1 VIEW COMPARISON:  02/05/2015 FINDINGS: The orogastric tube extends into the stomach. Tracheostomy is in the midline. The lungs are clear. No pneumothorax. IMPRESSION: Enteric tube extends into the stomach. No acute cardiopulmonary findings. Electronically Signed   By: Ellery Plunkaniel R Mitchell M.D.   On: 02/08/2015 03:03   Dg Chest Port 1 View  02/05/2015  CLINICAL DATA:  Pneumonia EXAM: PORTABLE CHEST 1 VIEW COMPARISON:  Radiograph 02/04/2015 FINDINGS: Tracheostomy tube and feeding tube are unchanged. Normal cardiac silhouette. There are low lung volumes. Mild central venous congestion slightly increased. No infiltrate. No pneumothorax. IMPRESSION: Low lung volumes with mild increase in central venous congestion . Stable support apparatus. Electronically Signed   By: Genevive BiStewart  Edmunds M.D.   On: 02/05/2015 07:29   Dg Chest Port 1 View  02/04/2015  CLINICAL DATA:  Respiratory failure.  Tracheostomy. EXAM: PORTABLE CHEST 1 VIEW COMPARISON:  None. FINDINGS: Tracheostomy tube is identified without gross complicating features. Small bore feeding tube entering the stomach with tip off the field of view. This is a low volume film with minimal bibasilar atelectasis again noted. The cardiomediastinal silhouette is unchanged. There is no evidence of focal airspace disease, pulmonary edema, suspicious pulmonary nodule/mass, pleural effusion, or pneumothorax. No acute bony abnormalities are identified. IMPRESSION: Tracheostomy tube placement without acute or complicating abnormalities. Minimal bibasilar atelectasis again noted. Electronically Signed   By: Harmon PierJeffrey  Hu M.D.   On: 02/04/2015 09:39   Dg Chest Port 1 View  02/04/2015  CLINICAL DATA:  Pneumonia EXAM: PORTABLE CHEST 1 VIEW COMPARISON:   Portable chest x-ray of February 03, 2015 FINDINGS: There has been marked improvement in the aeration of the left lung. No significant pleural effusion is evident. No pneumothorax is demonstrated either. The right lung is clear. The heart and pulmonary vascularity are normal. The endotracheal tube tip lies approximately 4.4 cm above the carina. The feeding tube tip cannot be clearly discerned. IMPRESSION: Marked improved aeration of the left lung consistent with resolution of atelectasis secondary to a central obstructive process. There is no significant pleural effusion and no evidence of a pneumothorax. The endotracheal tube is in reasonable position. The feeding tube tip cannot be demonstrated. An abdominal x-ray is recommended. Electronically Signed   By: David  SwazilandJordan M.D.   On: 02/04/2015 07:11   Dg Chest Port 1 View  02/03/2015  CLINICAL DATA:  Acute onset of respiratory distress. Initial encounter. EXAM: PORTABLE CHEST 1 VIEW COMPARISON:  Chest radiograph performed 02/01/2015. FINDINGS: There is near complete opacification of the left hemithorax, reflecting a relatively large left-sided pleural effusion and associated airspace opacification. There is mild leftward mediastinal shift, reflecting mild left-sided volume loss. The right lung appears relatively clear. No pneumothorax is seen. The cardiomediastinal silhouette is not well assessed due to airspace consolidation. No acute osseous abnormalities are identified. The patient's enteric tube is noted extending overlying the body of the stomach. IMPRESSION:  Near-complete opacification of the left hemithorax, reflecting a relatively large left-sided pleural effusion and associated airspace opacification. Mild leftward mediastinal shift reflects mild left-sided volume loss. This may reflect pneumonia. Would perform follow-up chest radiograph after completion of treatment, to ensure resolution of airspace opacity. Electronically Signed   By: Roanna Raider  M.D.   On: 02/03/2015 21:17   Dg Chest Port 1 View  02/01/2015  CLINICAL DATA:  Hypoxia EXAM: PORTABLE CHEST 1 VIEW COMPARISON:  January 31, 2015 FINDINGS: Endotracheal tube tip is 3.9 cm above the carina. Nasogastric tube tip and side port are below the diaphragm. No pneumothorax. There is atelectatic change in the left base region, stable. Lungs elsewhere clear. Heart size and pulmonary vascularity are normal. No adenopathy. IMPRESSION: Tube positions as described without pneumothorax. Left base atelectasis is stable. No new opacity. No change in cardiac silhouette. Electronically Signed   By: Bretta Bang III M.D.   On: 02/01/2015 07:00   Dg Chest Port 1 View  01/31/2015  CLINICAL DATA:  Intubated patient. History of hypertension and diabetes. Acute myocardial infarction. EXAM: PORTABLE CHEST 1 VIEW COMPARISON:  01/30/2015. FINDINGS: 0545 hours. Endotracheal tube tip is unchanged in the mid trachea. The left IJ central venous catheter and nasogastric tube are unchanged. Left base atelectasis has minimally improved. The lungs are otherwise clear. There is no pleural effusion or pneumothorax. The heart size and mediastinal contours are stable. IMPRESSION: Interval improved left basilar aeration.  Stable support system. Electronically Signed   By: Carey Bullocks M.D.   On: 01/31/2015 09:53   Dg Chest Port 1 View  01/30/2015  CLINICAL DATA:  Recent endotracheal tube advancement EXAM: PORTABLE CHEST - 1 VIEW COMPARISON:  01/30/2015 FINDINGS: Cardiac shadow is stable. Endotracheal tube now all is seen at 4.2 cm above the carina and in satisfactory position. A nasogastric catheter is noted in the stomach. A left jugular central line is again seen and stable. No new focal infiltrate or sizable effusion is noted. Minimal left basilar atelectasis remains. IMPRESSION: Endotracheal to further within the trachea as described. Persistent left basilar atelectasis. Electronically Signed   By: Alcide Clever M.D.    On: 01/30/2015 19:37   Dg Chest Port 1 View  01/30/2015  CLINICAL DATA:  Acute respiratory failure EXAM: PORTABLE CHEST 1 VIEW COMPARISON:  01/30/2015 FINDINGS: Endotracheal tube terminates 6.5 cm above the carina. Mild left basilar atelectasis. No focal consolidation. No pleural effusion or pneumothorax. The heart is normal in size. IMPRESSION: Endotracheal tube terminates 6.5 cm above the carina. Electronically Signed   By: Charline Bills M.D.   On: 01/30/2015 13:02   Dg Chest Port 1 View  01/30/2015  CLINICAL DATA:  Acute respiratory failure, hypertension, diabetes mellitus EXAM: PORTABLE CHEST 1 VIEW COMPARISON:  Portable exam 0446 hours compared to 01/29/2015 FINDINGS: Rotated to the LEFT. Nasogastric tube extends into stomach. LEFT jugular central venous catheter tip projects over whole LEFT brachiocephalic vein near SVC confluence. Questionable visualization of an endotracheal tube at the thoracic inlet approximately 8.6 cm above carina. Stable heart size mediastinal contours for degree of rotation. Bibasilar atelectasis versus infiltrate, slightly increased. Superimposed artifacts without definite upper lobe infiltrate or pneumothorax. IMPRESSION: Probable endotracheal tube tip at thoracic inlet 8.6 cm above carina ; this can be advanced 4 cm if desired for more stable positioning in the thorax trachea. Increased bibasilar atelectasis versus infiltrate. Electronically Signed   By: Ulyses Southward M.D.   On: 01/30/2015 07:34   Dg Chest [facility]  01/30/2015  CLINICAL DATA:  Endotracheal tube placement.  Initial encounter. EXAM: PORTABLE CHEST 1 VIEW COMPARISON:  Chest radiograph performed earlier today at 4:46 a.m. FINDINGS: The patient's endotracheal tube is seen ending 4-5 cm above the carina. An enteric tube is noted extending below the diaphragm. A left IJ line is noted ending about the mid SVC. The lungs are mildly hypoexpanded. Mild vascular congestion is noted. Mild left basilar airspace  opacity may reflect atelectasis or possibly mild pneumonia. No pleural effusion or pneumothorax is seen. The cardiomediastinal silhouette is normal in size. No acute osseous abnormalities are seen. IMPRESSION: 1. Endotracheal tube seen ending 4-5 cm above the carina. 2. Lungs mildly hypoexpanded. Mild vascular congestion noted. Mild left basilar airspace opacity may reflect atelectasis or possibly mild pneumonia. Electronically Signed   By: Roanna Raider M.D.   On: 01/30/2015 07:05   Dg Chest Port 1 View  01/29/2015  CLINICAL DATA:  44 year old male with endotracheal tube replacement. EXAM: PORTABLE CHEST 1 VIEW COMPARISON:  01/29/2015 and prior exam FINDINGS: An endotracheal tube is present with tip 6 cm above the carina. Left IJ central venous catheter is again noted with tip overlying the upper SVC. An NG tube is present cold in the proximal stomach. This is a low volume film with left basilar atelectasis again noted. There is no evidence of pneumothorax. The cardiomediastinal silhouette is unchanged. IMPRESSION: Endotracheal tube with tip 6 cm above the carina. Low volume film with continued left basilar atelectasis. Electronically Signed   By: Harmon Pier M.D.   On: 01/29/2015 21:59   Dg Chest Port 1 View  01/29/2015  CLINICAL DATA:  Intubated.  Shortness of breath. EXAM: PORTABLE CHEST 1 VIEW COMPARISON:  01/28/2015 FINDINGS: Endotracheal tube remains in place with tip at the level of the clavicular heads. Left internal jugular central venous catheter is unchanged. Enteric tube courses into the left upper abdomen, incompletely imaged. Cardiomediastinal silhouette is unchanged and within normal limits. Lung volumes are slightly lower than on the prior study with mildly increased left basilar opacity. No sizable pleural effusion or pneumothorax is identified. IMPRESSION: Increased left basilar atelectasis. Electronically Signed   By: Sebastian Ache M.D.   On: 01/29/2015 07:52   Dg Chest Port 1  View  01/28/2015  CLINICAL DATA:  Acute respiratory failure with hypoxia. Cardiac arrest. Acute myocardial infarction. EXAM: PORTABLE CHEST 1 VIEW COMPARISON:  01/27/2015 and 01/26/2015 FINDINGS: Endotracheal tube and NG tube and central line all appear in good position, unchanged. Heart size and pulmonary vascularity are normal. Minimal atelectasis at the left lung base. IMPRESSION: Minimal atelectasis at the left lung base. Electronically Signed   By: Francene Boyers M.D.   On: 01/28/2015 07:29   Dg Chest Port 1 View  01/27/2015  CLINICAL DATA:  Shortness of breath. EXAM: PORTABLE CHEST 1 VIEW COMPARISON:  01/26/2015 . FINDINGS: Endotracheal tube, NG tube, left IJ line in stable position. Cardiomegaly with normal pulmonary vascularity. Low lung volumes with bibasilar atelectasis and/or infiltrates or persistent. No pleural effusion pneumothorax. IMPRESSION: 1. Lines and tubes in stable position. 2. Persistent unchanged low lung volumes with bibasilar atelectasis and/or infiltrates. 3. Stable cardiomegaly. No pulmonary venous congestion on today's exam . Electronically Signed   By: Maisie Fus  Register   On: 01/27/2015 07:20   Dg Chest Port 1 View  01/26/2015  CLINICAL DATA:  Intubation. EXAM: PORTABLE CHEST 1 VIEW COMPARISON:  01/25/2015. FINDINGS: Endotracheal tube and NG tube in good anatomic position. Cardiomegaly with mild pulmonary venous congestion.  Persistent but improving bilateral basilar atelectasis and/or infiltrates. No pleural effusion or pneumothorax. IMPRESSION: 1. Lines and tubes stable position. 2. Cardiomegaly with mild pulmonary venous congestion. 3. Low lung volumes with interim partial clearing of bibasilar atelectasis and/or infiltrates. Electronically Signed   By: Maisie Fus  Register   On: 01/26/2015 07:15   Dg Chest Port 1 View  01/25/2015  CLINICAL DATA:  Intubation. EXAM: PORTABLE CHEST 1 VIEW COMPARISON:  01/23/2015. FINDINGS: Endotracheal tube, NG tube, left IJ line in stable  position. Heart size stable. Mild pulmonary venous congestion. Low lung volumes with bibasilar atelectasis. No pleural effusion or pneumothorax . IMPRESSION: 1. Lines and tubes in stable position. 2. Mild pulmonary venous congestion.  Heart size stable. 3. Low lung volumes with mild bibasilar atelectasis. Electronically Signed   By: Maisie Fus  Register   On: 01/25/2015 07:47   Dg Chest Port 1 View  01/23/2015  CLINICAL DATA:  Encounter for central line placement. Hypertension diabetes. EXAM: PORTABLE CHEST 1 VIEW COMPARISON:  Earlier today at 0500 hours. FINDINGS: 1346 hours. Endotracheal tube terminates 2.9 cm above carina. Nasogastric extends beyond the inferior aspect of the film. Left internal jugular line tip at low SVC. Mildly degraded exam due to AP portable technique and patient body habitus. Normal heart size for level of inspiration. Left costophrenic angle minimally excluded. No pleural effusion or pneumothorax. No congestive failure. Low lung volumes with resultant pulmonary interstitial prominence. IMPRESSION: Left internal jugular line appropriately positioned, without pneumothorax. Low lung volumes, without acute disease. Electronically Signed   By: Jeronimo Greaves M.D.   On: 01/23/2015 14:17   Dg Chest Port 1 View  01/23/2015  CLINICAL DATA:  Respiratory failure.  Hypertension and diabetes. EXAM: PORTABLE CHEST 1 VIEW COMPARISON:  01/22/2015 FINDINGS: Endotracheal tube terminates 2.6 cm above carina. Nasogastric extends beyond the inferior aspect of the film. Numerous leads and wires project over the chest. Cardiomegaly accentuated by AP portable technique. No pleural effusion or pneumothorax. Low lung volumes with resultant pulmonary interstitial prominence. No congestive failure. No lobar consolidation. IMPRESSION: Similar appearance of cardiomegaly and low lung volumes. No acute findings. Electronically Signed   By: Jeronimo Greaves M.D.   On: 01/23/2015 08:01   Dg Chest Portable 1  View  01/22/2015  CLINICAL DATA:  Cardiac arrest, status post intubation EXAM: PORTABLE CHEST 1 VIEW COMPARISON:  None. FINDINGS: Endotracheal tube terminates 2 cm above the carina. Low lung volumes.  Vascular crowding. The heart is top-normal in size for inspiration. Enteric tube courses into the stomach. IMPRESSION: Endotracheal tube terminates 2 cm above the carina. Low lung volumes with vascular crowding. Electronically Signed   By: Charline Bills M.D.   On: 01/22/2015 11:46   Dg Abd Portable 1v  02/08/2015  CLINICAL DATA:  OG tube placement EXAM: PORTABLE ABDOMEN - 1 VIEW COMPARISON:  02/03/2015 FINDINGS: No enteric tube is visible.  Abdominal gas pattern is unremarkable. IMPRESSION: No visible enteric tube. Electronically Signed   By: Ellery Plunk M.D.   On: 02/08/2015 03:01   Dg Abd Portable 1v  02/03/2015  CLINICAL DATA:  Nasogastric tube placement EXAM: PORTABLE ABDOMEN - 1 VIEW COMPARISON:  Portable exam 1622 hours compared to 02/02/2015 FINDINGS: Tip of feeding tube projects over expected position of ligament of Treitz or proximal jejunum. Bowel gas pattern normal. No bowel dilatation or bowel wall thickening. IMPRESSION: Tip of feeding tube projects over either the ligament of Treitz or proximal jejunum. Electronically Signed   By: Ulyses Southward M.D.   On: 02/03/2015  16:30   Dg Abd Portable 1v  02/01/2015  CLINICAL DATA:  Orogastric tube placement.  Initial encounter. EXAM: PORTABLE ABDOMEN - 1 VIEW COMPARISON:  None. FINDINGS: The patient's orogastric tube is noted ending overlying the antrum of the stomach. The visualized bowel gas pattern is grossly unremarkable, though not fully assessed. No free intra-abdominal air is seen, though evaluation is limited on a single supine view. No acute osseous abnormalities are identified. IMPRESSION: Orogastric tube noted ending overlying the antrum of the stomach. Electronically Signed   By: Roanna Raider M.D.   On: 02/01/2015 05:22       ASSESSMENT/PLAN: 1.  S/P acute inferior STEMI complicated by cardiac arrest- cath with Mid RCA lesion, 99% stenosed. Post intervention with aspiration thrombectomy, there is a 0% residual stenosis.  1st RPLB lesion, 100% stenosed.  Prox RCA-1 lesion, 40% stenosed.  Prox RCA-2 lesion, 99% stenosed, with probable thrombus embolized to the posterior lateral artery and the mid RCA.. S/P thrombectomy and PCI with Synergy stent.  The left ventricular systolic function is normal with only basal inferior hypokinesis.  Normal LVEDP.  The left system is free of significant coronary disease.  Continue Brilinta/ASA/BB/high dose statin.  HR mildly elevated but he has low grade fever which may be driving this.  BP is soft so cannot increase BB further at this time. 2.  HTN - controlled 3.  DM - per CCM 4.  ARF/ATN - improving 5.  Post obstructive pneumonitis vs. PNA on antibx.    Quintella Reichert, MD  02/08/2015  8:02 AM

## 2015-02-08 NOTE — Progress Notes (Signed)
Nutrition Follow-up  DOCUMENTATION CODES:   Obesity unspecified  INTERVENTION:   D/C Vital High Protein  Start Glucerna 1.2 @ 80 ml/hr Provides: 2304 kcal, 115 grams protein, and 1549 ml H2O Total free water: 3049 ml   NUTRITION DIAGNOSIS:   Inadequate oral intake related to inability to eat as evidenced by NPO status. Ongoing.   GOAL:   Provide needs based on ASPEN/SCCM guidelines Met.   MONITOR:   Skin, I & O's, Labs, Weight trends, TF tolerance   ASSESSMENT:   44yo male with hx HTN, DM presented 10/15 after witnessed arrest.  Trach placed: 10/28, on trach collar only 10/30 Cortrak tube in place, tip now in stomach, ok to feed Medications reviewed and include: KCl Labs reviewed: Sodium elevated (147) free water ordered (250 ml every 4 hours = 1500 ml) CBG's: 238-244 Vital High Protein @ 40 ml/hr with 60 ml Prostat TID= 1560 kcal, 174 grams protein, and 1802 ml H2O.  Per RN pt will transfer to [facility] today.   Diet Order:  Diet NPO time specified  Skin:  Wound (see comment) (Unstageable wound sacrum, MASD on groin and sacrum )  Last BM:  10/31 900 ml via rectal tube (placed 10/28)  Height:   Ht Readings from Last 1 Encounters:  01/22/15 6' 2" (1.88 m)   Weight:   Wt Readings from Last 1 Encounters:  02/08/15 227 lb 4.7 oz (103.1 kg)   Ideal Body Weight:  86.3 kg  BMI:  Body mass index is 29.17 kg/(m^2).  Estimated Nutritional Needs:   Kcal:  2200-2400  Protein:  110-120 grams  Fluid:  > 2.2 L/day  EDUCATION NEEDS:   No education needs identified at this time    RD, LDN, CNSC [phone] Pager [phone] After Hours Pager  

## 2015-02-08 NOTE — Progress Notes (Addendum)
eLink Physician-Brief Progress Note Patient Name: Albert Jensen DOB: 04/18/1970 MRN: [id number]   Date of Service  02/08/2015  HPI/Events of Note  CXR reveals Panda tube to be coiled in stomach. Bedside nurse relates that the [facility] tube needs to be transpyloric. Will need Panda advanced under fluoroscopy in AM. Patient is on Lantus. Last blood glucose = 245.  eICU Interventions  Will order: 1. D5 0.45 NaCl to run IV at 40 mL/hour.      Intervention Category Intermediate Interventions: Diagnostic test evaluation  Sommer,Steven Eugene 02/08/2015, 4:32 AM

## 2015-02-08 NOTE — Progress Notes (Signed)
Tube feeding on hold. NG tube not in the right place.

## 2015-02-08 NOTE — Care Management Note (Signed)
Case Management Note  Patient Details  Name: Tye MarylandRicky Marcantonio MRN: [id number] Date of Birth: 05/29/1970  Subjective/Objective:                    Action/Plan: Received a call from bedside RN stating that patient is ready for discharge to LTAC today.  CM contacted Judie GrieveBryan with [facility] to provide update on plan for discharge today. Per Judie GrieveBryan, patient does have a bed and admitting physician for today.  Judie GrieveBryan to meet with bedside RN to make final arrangements for discharge today. Expected Discharge Date:                  Expected Discharge Plan:  Long Term Acute Care (LTAC)  In-House Referral:     Discharge planning Services  CM Consult, Indigent Health Clinic, Medication Assistance  Post Acute Care Choice:    Choice offered to:  Sibling  DME Arranged:    DME Agency:     HH Arranged:    HH Agency:     Status of Service:  Completed, signed off  Medicare Important Message Given:    Date Medicare IM Given:    Medicare IM give by:    Date Additional Medicare IM Given:    Additional Medicare Important Message give by:     If discussed at Long Length of Stay Meetings, dates discussed:    Additional Comments:  Anda KraftRobarge, [place] Dominique C, RN 02/08/2015, 12:31 PM

## 2015-02-08 NOTE — Discharge Summary (Signed)
Physician Discharge Summary       Patient ID: Albert Jensen MRN: [id number] DOB/AGE: 09-Sep-1970 44 y.o.  Admit date: 01/22/2015 Discharge date: 02/08/2015  Discharge Diagnoses:  Active Problems:   Cardiac arrest (HCC)   Acute MI, inferolateral wall, initial episode of care (HCC)   Respiratory failure (HCC)   Anoxic encephalopathy (HCC)   Acute respiratory failure with hypoxia (HCC)   Acute ST elevation myocardial infarction (STEMI) (HCC)   Acute respiratory failure (HCC)   Pressure ulcer   Encephalopathy   Acute renal failure (HCC)   Urinary tract infection, site not specified   Type II diabetes mellitus, uncontrolled (HCC)   Disorientation   HCAP (healthcare-associated pneumonia)   Atelectasis   Acute respiratory failure with hypoxemia (HCC)   Tracheostomy status (HCC)   History of Present Illness:  44 year old man who is brought in by EMS 01/22/2015 after suffering a cardiac arrest. History is obtained from the patient's friend who was with him at the time of the arrest. The patient was at an outdoor event. He had been there about 10 minutes. He walked up to his friend holding a T-shirt to give him. As he was standing, the eyes rolled back and he fell backwards. He hit his head directly on concrete. Bystanders that were present started performing CPR. At one point, the patient's breathing returned. The friend who I spoke to states that the patient was snoring. The breathing again stopped. Resuscitation was attempted. He estimates that it was about 15 minutes before EMS came. After EMS arrived, he was defibrillated twice and had restoration of sinus rhythm. Initial ECG showed significant ST elevations laterally with inferior ST depressions that reciprocal. He was brought to the hospital. His airway was switched out in the emergency room. Repeat ECG in the hospital showed resolution of the ST elevation in the lateral leads. Due to him hitting his head, we elected to perform CT scan of  the head. There is no evidence of bleeding. He was taken to the Cath Lab for emergent cardiac catheterization.  Hospital Course:  He was taken to the cath lab for acute inferior STEMI complicated by cardiac arrest- cath with Mid RCA lesion, 99% stenosed. Post intervention with aspiration thrombectomy, there is a 0% residual stenosis. 1st RPLB lesion, 100% stenosed. Prox RCA-1 lesion, 40% stenosed. Prox RCA-2 lesion, 99% stenosed, with probable thrombus embolized to the posterior lateral artery and the mid RCA.. S/P thrombectomy and PCI with Synergy stent. The left ventricular systolic function is normal with only basal inferior hypokinesis. Post-procedurally he was taken to ICU and therapeutic hypothermia therapy was initiated. He was maintained on the ventilator and vasoactive infusions. He was also diagnosed with UTI which was treated with rocephin initially. Flail chest noted as well which complicated weaning from vent. Over the next few days concerns for anoxic brain injury worsened. 10/20 he was still unable to follow commands despite long wake up assessment. EEG supportive of severe encephalopathy, however, patient was medically sedated at the time. This did improve over the next couple of days and he was able to follow some minimal commands. Hospital course at that time was complicated by fever. VAP considered to be possible source and he was stated on vanc/zosyn. Vanc was stopped 10/24 after cultures resulted. He did struggle with copious ETT secretions. He was extubated 10/25 without complication. He did have some kidney injury at that point, which was suspected to be due to overdiuresis. Free water boluses initiated. He was transferred to floor. 10/27  he had a rapid decompensation while on floor leading to respiratory arrest in the setting of copious secretions and probable mucous plugging. He was emergently intubated and returned to ICU. Underwent bronchoscopy and removed several large plugs.  Underwent tracheostomy the following day. Post operatively he was awake and following commands.     Discharge Plan by active problems   PULMONARY A: Acute respiratory failure - post cardiac arrest  Mucous plugging Aspiration vs post-obstruction pneumonitis vs VAP/HCAP Rib fractures complicating spontaneous respirations  P:  Chest PT ct ATC as tolerated Can progress with PM valve  CARDIOVASCULAR A: Cardiac arrest - PEA  STEMI  RCA occlusion - s/p stenting 10/15  P:  ASA, brilinta Hold home lisinopril, tricor, HCTZ further Coreg decreased to 12.5mg  bid on 10/29, based on BP Holdinglasix  RENAL A: ARF, ATN; improving Hypernatremia, resolved Hypokalemia P:  Follow BMP Continue free water Correct electrolytes as indicated  GASTROINTESTINAL A: Dysphagia P:  PPI Resume TF's (glucerna 80/hr) - panda in stomach ok - does not need post pyloric position Will need ST eval as on trach collar now.   HEMATOLOGIC A: DVT prevention P:  F/u CBC Dc Lovenox due to trach site bleed - can resume at some point  INFECTIOUS A: UTI  pneumonitis favored vs aspiration of TF vs VAP/HCAP Persistent Fevers Thrush P:  Ancef projected stop date 11/5 Follow WBC and fever curve ABX and culture data history as below.   ENDOCRINE DM - Uncontrolled  P:  Sliding scaleInsulin Lantus 20 units bid   NEUROLOGIC AMS / Hypoxic Encephalopathy - post cardiac arrest  Fall - head CT neg   P:  PT for deconditioning PRN fentanyl for analgesia    Significant Hospital tests/ studies   STUDIES:  CT head/c-spine 10/15 >> neg for acute process ECHO 10/18 >> LV mild dilation, EF 65-70%, no RWMA, normal PA pressure 10/21 EEG >nabnormal EEG with findings consistent with a severe encephalopathy, non specific as to cause. No electrographic seizures noted. Clinical correlation is advised.   SIGNIFICANT EVENTS: 10/15 Admit after witnessed cardiac arrest. To cath  lab rca stent 10/25 extubated 10/26 transferred to floor. 10/27 intubated, back to ICU, mucous plugging left main collapse 10/28 Tracheostomy  LINES/TUBES OETT 10/15 >>10/25 OETT 10/27 >> 10/28 Trach 10/28 (DF) >>  Postpyloric tube 10/27 >  CULTURES: Urine culture 10/16 >> negative  Urine cx 10/22 >> negative Sputum 10/22 >>rare candida  BC 10/22 >> negative BAL 10/27 >>> not sent Blood cx 10/28 >>  Resp 10/29 >> MSSA Cdiff 10/25 >> Negative   ABX: Rocephin 10/16 >> 10/20 Vanco 10/22 >> 10/24 Zosyn 10/22 >>> 10/28 meropenem 10/28 (empiric) >> 10/31 vanco 10/28 (empiric) >> 10/30 Fluconazole 10/28 (thrush ) >> 10/31  ANCEF 11/5 projected stop date  Consults  Cardiology Wound care Neurology Physical medicine  Discharge Exam: BP 106/68 mmHg  Pulse 97  Temp(Src) 101.2 F (38.4 C) (Oral)  Resp 21  Ht 6\' 2"  (1.88 m)  Wt 103.1 kg (227 lb 4.7 oz)  BMI 29.17 kg/m2  SpO2 100%  General: Well developed, well nourished, in no acute distress Head: Eyes PERRLA, No xanthomas. Normal cephalic and atramatic Lungs: Rhonchi anteriorly Heart: HRRR S1 S2 Pulses are 2+ & equal. Abdomen: Bowel sounds are positive, abdomen soft and non-tender without masses  Extremities: No clubbing, cyanosis or edema. DP +1 Neuro: Alert and oriented X 3. Psych: Good affect, responds appropriately  Labs at discharge Lab Results  Component Value Date   CREATININE 0.86  02/08/2015   BUN 27* 02/08/2015   NA 149* 02/08/2015   K 3.7 02/08/2015   CL 114* 02/08/2015   CO2 27 02/08/2015   Lab Results  Component Value Date   WBC 5.9 02/08/2015   HGB 10.6* 02/08/2015   HCT 34.5* 02/08/2015   MCV 91.8 02/08/2015   PLT 277 02/08/2015   Lab Results  Component Value Date   ALT 37 02/05/2015   AST 67* 02/05/2015   ALKPHOS 63 02/05/2015   BILITOT 0.9 02/05/2015   Lab Results  Component Value Date   INR 1.27 02/07/2015   INR 1.18 01/31/2015   INR 1.08 01/22/2015     Current radiology studies Dg Chest Port 1 View  02/08/2015  CLINICAL DATA:  Acute respiratory failure EXAM: PORTABLE CHEST 1 VIEW COMPARISON:  02/05/2015 FINDINGS: The orogastric tube extends into the stomach. Tracheostomy is in the midline. The lungs are clear. No pneumothorax. IMPRESSION: Enteric tube extends into the stomach. No acute cardiopulmonary findings. Electronically Signed   By: Ellery Plunk M.D.   On: 02/08/2015 03:03   Dg Abd Portable 1v  02/08/2015  CLINICAL DATA:  OG tube placement EXAM: PORTABLE ABDOMEN - 1 VIEW COMPARISON:  02/03/2015 FINDINGS: No enteric tube is visible.  Abdominal gas pattern is unremarkable. IMPRESSION: No visible enteric tube. Electronically Signed   By: Ellery Plunk M.D.   On: 02/08/2015 03:01    Disposition:  Final discharge disposition not confirmed      Discharge Instructions    Call MD for:  difficulty breathing, headache or visual disturbances    Complete by:  As directed      Call MD for:  extreme fatigue    Complete by:  As directed      Call MD for:  persistant dizziness or light-headedness    Complete by:  As directed      Call MD for:  persistant nausea and vomiting    Complete by:  As directed      Call MD for:  redness, tenderness, or signs of infection (pain, swelling, redness, odor or green/yellow discharge around incision site)    Complete by:  As directed      Call MD for:  severe uncontrolled pain    Complete by:  As directed      Call MD for:  temperature >100.4    Complete by:  As directed      Discharge instructions    Complete by:  As directed   1.  Trach care per protocol with inner cannula change 2.  O2 via ATC 28% 3.  Panda care - will need SLP evaluation  4.  Wean protocol - begin with ATC.     Increase activity slowly    Complete by:  As directed             Medication List    STOP taking these medications        fenofibrate 48 MG tablet  Commonly known as:  TRICOR     fenofibrate  micronized 130 MG capsule  Commonly known as:  ANTARA     glipiZIDE 10 MG tablet  Commonly known as:  GLUCOTROL     lisinopril 10 MG tablet  Commonly known as:  PRINIVIL,ZESTRIL     lisinopril-hydrochlorothiazide 10-12.5 MG tablet  Commonly known as:  PRINZIDE,ZESTORETIC     metFORMIN 1000 MG tablet  Commonly known as:  GLUCOPHAGE      TAKE these medications  acetaminophen 160 MG/5ML solution  Commonly known as:  TYLENOL  Take 20.3 mLs (650 mg total) by mouth every 6 (six) hours as needed for mild pain, headache or fever.     aspirin 81 MG chewable tablet  Place 1 tablet (81 mg total) into feeding tube daily.     atorvastatin 80 MG tablet  Commonly known as:  LIPITOR  Place 1 tablet (80 mg total) into feeding tube daily at 6 PM.     carvedilol 12.5 MG tablet  Commonly known as:  COREG  Place 1 tablet (12.5 mg total) into feeding tube 2 (two) times daily with a meal.     ceFAZolin 1-5 GM-%  Commonly known as:  ANCEF  Inject 50 mLs (1 g total) into the vein every 8 (eight) hours.     collagenase ointment  Commonly known as:  SANTYL  Apply topically daily. Apply to sacral ulcer daily     dextrose 5 % and 0.45% NaCl infusion  Inject 40 mL/hr into the vein continuous.     feeding supplement (GLUCERNA 1.2 CAL) Liqd  Place 80 mL/hr into feeding tube continuous.     fentaNYL 100 MCG/2ML injection  Commonly known as:  SUBLIMAZE  Inject 1-2 mLs (50-100 mcg total) into the vein every 2 (two) hours as needed (to maintain RASS goal.).     free water Soln  Place 250 mLs into feeding tube every 4 (four) hours.     hydrALAZINE 20 MG/ML injection  Commonly known as:  APRESOLINE  Inject 0.5 mLs (10 mg total) into the vein every 4 (four) hours as needed (SBP > 170.).     insulin aspart 100 UNIT/ML injection  Commonly known as:  novoLOG  Inject 4 Units into the skin every 4 (four) hours.     insulin aspart 100 UNIT/ML injection  Commonly known as:  novoLOG  CBG < 70:  implement hypoglycemia protocol CBG 70 - 120: 0 units CBG 121 - 150: 3 units CBG 151 - 200: 4 units CBG 201 - 250: 7 units CBG 251 - 300: 11 units CBG 301 - 350: 15 units CBG 351 - 400: 20 units CBG > 400: call MD and obtain STAT lab verification     insulin glargine 100 UNIT/ML injection  Commonly known as:  LANTUS  Inject 0.3 mLs (30 Units total) into the skin daily.     labetalol 5 MG/ML injection  Commonly known as:  NORMODYNE,TRANDATE  Inject 2 mLs (10 mg total) into the vein every 2 (two) hours as needed.     nystatin 100000 UNIT/ML suspension  Commonly known as:  MYCOSTATIN  Take 5 mLs (500,000 Units total) by mouth 4 (four) times daily.     ondansetron 4 MG/2ML Soln injection  Commonly known as:  ZOFRAN  Inject 2 mLs (4 mg total) into the vein every 6 (six) hours as needed for nausea.     pantoprazole sodium 40 mg/20 mL Pack  Commonly known as:  PROTONIX  Place 20 mLs (40 mg total) into feeding tube daily.     potassium chloride 20 MEQ/15ML (10%) Soln  Place 15 mLs (20 mEq total) into feeding tube daily.     sodium chloride 0.9 % injection  Inject 3 mLs into the vein every 12 (twelve) hours.     sodium chloride 0.9 % injection  Inject 3 mLs into the vein as needed.     ticagrelor 90 MG Tabs tablet  Commonly known as:  Engineer, site  1 tablet (90 mg total) into feeding tube 2 (two) times daily.       Follow-up Information    Follow up with Quintella Reichert, MD.   Specialty:  Cardiology   Why:  Post discharge from [facility]   Contact information:   [address] [place] [address] [phone]       Discharged Condition: stable  Greater than 30 minutes of time have been dedicated to discharge assessment, planning and discharge instructions.   Signed: Joneen Roach, AGACNP-BC [place] [facility] Pager [phone] or [phone]  02/08/2015 12:16 PM

## 2015-02-08 NOTE — Progress Notes (Signed)
Dr Vassie LollAlva in to see patient. Orders given to use OGT for medications and feeding.

## 2015-02-08 NOTE — Progress Notes (Signed)
PULMONARY / CRITICAL CARE MEDICINE   Name: Albert Jensen MRN: [id number] DOB: June 12, 1970    ADMISSION DATE:  01/22/2015 CONSULTATION DATE:  10/15  REFERRING MD :  Isabel Caprice   CHIEF COMPLAINT:  Cardiac arrest   INITIAL PRESENTATION: 44 y/o male with hx HTN, DM presented 10/15 after witnessed arrest.  Initial rhythm PEA on EMS arrival with approx 25 mins CPR before ROSC (15 mins bystander and 10 mins with EMS). EKG concerning for STEMI, after negative head CT he was taken urgently to cath lab and PCCM consulted for vent management/hypothermia protocol. Underwent cardiac catheterization RCA showed mid RCA 99% lesion. Underwent thrombectomy, PCI/Synergy stent. Hospital course altered mental status after cardiac arrest with neurology services consulted. EEG showed no seizure activity suspect hypoxic encephalopathy. Treated with Zosyn Vanc & for suspect HCAP. He was x-rayed at 10/25, and transferred to floor 10/26. 10/27 had acute respiratory status decompensation requiring intubation, thick secretions with mucous plugs identified.  STUDIES:  CT head/c-spine 10/15 >> neg for acute process ECHO 10/18 >> LV mild dilation, EF 65-70%, no RWMA, normal PA pressure 10/21 EEG >nabnormal EEG with findings consistent with a severe encephalopathy, non specific as to cause. No electrographic seizures noted. Clinical correlation is advised.   SIGNIFICANT EVENTS: 10/15  Admit after witnessed cardiac arrest.  To cath lab rca stent 10/25 extubated 10/27 intubated, back to ICU, mucous plugging left main collapse 10/28 Tstomy   SUBJECTIVE / Interval events:  Tolerating ATC x 24h Decreased Bleeding around trach site Febrile , low gr Good UO  VITAL SIGNS: Temp:  [99.6 F (37.6 C)-101.5 F (38.6 C)] 100.5 F (38.1 C) (11/01 0800) Pulse Rate:  [96-114] 112 (11/01 0810) Resp:  [7-35] 29 (11/01 0810) BP: (93-132)/(57-90) 104/66 mmHg (11/01 0810) SpO2:  [99 %-100 %] 100 % (11/01 0810) FiO2 (%):  [28 %] 28 %  (11/01 0810) Weight:  [103.1 kg (227 lb 4.7 oz)] 103.1 kg (227 lb 4.7 oz) (11/01 0400)  HEMODYNAMICS:    VENTILATOR SETTINGS: Vent Mode:  [-]  FiO2 (%):  [28 %] 28 % INTAKE / OUTPUT:  Intake/Output Summary (Last 24 hours) at 02/08/15 0825 Last data filed at 02/08/15 0600  Gross per 24 hour  Intake   1201 ml  Output   3020 ml  Net  -1819 ml   PHYSICAL EXAMINATION: General:  Acutely ill  male on vent Neuro:  rass 0 , weak all ext, perrl, follows commands, strong cough HEENT:  PEERL, no bleeding - crusting around trach site Cardiovascular:  RRR, no MRG Lungs:  Coarse BS bilaterally Abdomen:  Soft, non-distended, no r/g Musculoskeletal:  No acute deformity Skin:  Intact, MMM   LABS: CBC  Recent Labs Lab 02/05/15 0300 02/06/15 0305 02/08/15 0529  WBC 16.7* 8.0 5.9  HGB 12.1* 10.4* 10.6*  HCT 39.0 33.0* 34.5*  PLT 280 283 277   Coag's  Recent Labs Lab 02/07/15 1654  APTT 34  INR 1.27   BMET  Recent Labs Lab 02/06/15 0305 02/07/15 0416 02/08/15 0529  NA 147* 150* 149*  K 2.8* 3.8 3.7  CL 111 115* 114*  CO2 BUN 41* 36* 27*  CREATININE 1.04 0.96 0.86  GLUCOSE 281* 333* 296*   Electrolytes  Recent Labs Lab 02/02/15 0223  02/04/15 0621  02/06/15 0305 02/07/15 0416 02/08/15 0529  CALCIUM 9.3  < > 8.8*  < > 8.3* 8.6* 8.4*  MG  --   --  2.7*  --   --   --   --  PHOS 3.0  --   --   --   --   --   --   < > = values in this interval not displayed. Sepsis Markers  Recent Labs Lab 02/05/15 0930 02/06/15 0305 02/07/15 0416  PROCALCITON 2.11 1.96 1.25     ABG  Recent Labs Lab 02/03/15 2050 02/03/15 2254 02/04/15 0352  PHART 7.538* 7.431 7.517*  PCO2ART 31.6* 42.6 32.3*  PO2ART 47.7* 159* 131*   Liver Enzymes  Recent Labs Lab 02/04/15 0621 02/05/15 0300  AST 78* 67*  ALT 33 37  ALKPHOS 59 63  BILITOT 0.8 0.9  ALBUMIN 2.1* 2.0*     Cardiac Enzymes No results for input(s): TROPONINI, PROBNP in the last 168  hours. Glucose  Recent Labs Lab 02/07/15 0711 02/07/15 1056 02/07/15 1559 02/07/15 1956 02/07/15 2344 02/08/15 0321  GLUCAP 292* 294* 263* 261* 230* 244*   Imaging Dg Chest Port 1 View  02/08/2015  CLINICAL DATA:  Acute respiratory failure EXAM: PORTABLE CHEST 1 VIEW COMPARISON:  02/05/2015 FINDINGS: The orogastric tube extends into the stomach. Tracheostomy is in the midline. The lungs are clear. No pneumothorax. IMPRESSION: Enteric tube extends into the stomach. No acute cardiopulmonary findings. Electronically Signed   By: Ellery Plunkaniel R Mitchell M.D.   On: 02/08/2015 03:03   Dg Abd Portable 1v  02/08/2015  CLINICAL DATA:  OG tube placement EXAM: PORTABLE ABDOMEN - 1 VIEW COMPARISON:  02/03/2015 FINDINGS: No enteric tube is visible.  Abdominal gas pattern is unremarkable. IMPRESSION: No visible enteric tube. Electronically Signed   By: Ellery Plunkaniel R Mitchell M.D.   On: 02/08/2015 03:01   ASSESSMENT / PLAN:  PULMONARY OETT 10/15 >>10/25 OETT 10/27 >> 10/28 Trach 10/28 (DF) >>  Acute respiratory failure - post cardiac arrest  Mucous plugging Aspiration vs post-obstruction pneumonitis vs VAP/HCAP Rib fractures complicating spontaneous respirations  P:   Chest PT ct ATC as tolerated Can progress with PM valve  CARDIOVASCULAR CVL L IJ 10/16 >> out Cardiac arrest - PEA  STEMI  RCA occlusion - s/p stenting 10/15  P:  Telemetry monitoring ASA, brilinta Hold home lisinopril, tricor, HCTZ further Coreg decreased to 12.5mg  bid on 10/29, based on BP Holding  lasix  RENAL ARF, ATN; improving Hypernatremia, resolved Hypokalemia P:   Follow BMP Ct Free water Correct electrolytes as indicated  GASTROINTESTINAL Postpyloric tube 10/27 > Dysphagia P:   PPI Resume TF's - panda in stomach ok - does not need post pyloric position  HEMATOLOGIC DVT prevention P:  F/u CBC Dc Lovenox due to trach site bleed - can resume at some point  INFECTIOUS UTI  pneumonitis favored vs  aspiration of TF vs VAP/HCAP Thrush P:   Urine culture 10/16 >> negative  Urine cx 10/22 >> negative Sputum 10/22 >>rare candida  BC 10/22 >> negative BAL 10/27 >>> not sent Blood cx 10/28 >>  Resp 10/29 >> MSSA Cdiff 10/25 >> Negative    Rocephin 10/16 >> 10/20 Vanco 10/22 >> 10/24 Zosyn 10/22 >>> 10/28 meropenem 10/28 (empiric) >> 10/31 vanco 10/28 (empiric) >> 10/30 Fluconazole 10/28 (thrush ) >> 10/31   narrow to ancef x 7-10 days - Pct decrease reassuring    ENDOCRINE DM - Uncontrolled  P:   resistant scale  lantus increase to 20 u bid     NEUROLOGIC AMS / Hypoxic Encephalopathy  - post cardiac arrest  Fall - head CT neg   P:    PRN fentanyl Aggressive PT  FAMILY  - Updates: brother steve 11/1  GLOBAL:  Can transfer to [facility]  The patient is critically ill with multiple organ systems failure and requires high complexity decision making for assessment and support, frequent evaluation and titration of therapies, application of advanced monitoring technologies and extensive interpretation of multiple databases. Critical Care Time devoted to patient care services described in this note independent of APP time is 31 minutes.   Cyril Mourning MD. Tonny Bollman. [facility] Pager [phone] If no response call [phone]    02/08/2015, 8:25 AM

## 2015-02-08 NOTE — Progress Notes (Signed)
PT Cancellation Note  Patient Details Name: Albert Jensen MRN: [id number] DOB: 09/11/1970   Cancelled Treatment:    Reason Eval/Treat Not Completed: Other (comment) ([facility]  Will defer treat today in anticipation of imminent d/c to LTAC. 02/08/2015  Albert Jensen, PT [phone]  (pager)   Albert Jensen, Albert Jensen 02/08/2015, 3:09 PM

## 2015-02-08 NOTE — Progress Notes (Signed)
eLink Physician-Brief Progress Note Patient Name: Albert MarylandRicky Jensen DOB: 04/20/1970 MRN: [id number]   Date of Service  02/08/2015  HPI/Events of Note  Multiple issues: 1. Oral thrush and 2. Panda tube replaced.   eICU Interventions  Will order: 1. Nystatin swish and spit.  2. Portable KUB now.      Intervention Category Intermediate Interventions: Infection - evaluation and management Minor Interventions: Clinical assessment - ordering diagnostic tests  Juliona Vales Eugene 02/08/2015, 2:22 AM

## 2015-02-08 NOTE — Progress Notes (Signed)
[facility] due to pt sleeping.

## 2015-02-09 LAB — COMPREHENSIVE METABOLIC PANEL
ALT: 29 U/L (ref 17–63)
ANION GAP: 5 (ref 5–15)
AST: 34 U/L (ref 15–41)
Albumin: 1.7 g/dL — ABNORMAL LOW (ref 3.5–5.0)
Alkaline Phosphatase: 63 U/L (ref 38–126)
BUN: 23 mg/dL — ABNORMAL HIGH (ref 6–20)
CHLORIDE: 116 mmol/L — AB (ref 101–111)
CO2: 27 mmol/L (ref 22–32)
Calcium: 8.8 mg/dL — ABNORMAL LOW (ref 8.9–10.3)
Creatinine, Ser: 0.82 mg/dL (ref 0.61–1.24)
GFR calc non Af Amer: >60 mL/min (ref >60)
Glucose, Bld: 293 mg/dL — ABNORMAL HIGH (ref 65–99)
Potassium: 3.9 mmol/L (ref 3.5–5.1)
SODIUM: 148 mmol/L — AB (ref 135–145)
Total Bilirubin: 0.6 mg/dL (ref 0.3–1.2)
Total Protein: 5.5 g/dL — ABNORMAL LOW (ref 6.5–8.1)

## 2015-02-09 LAB — CBC WITH DIFFERENTIAL/PLATELET
BASOS PCT: 1 %
Basophils Absolute: 0 10*3/uL (ref 0.0–0.1)
EOS ABS: 0.2 10*3/uL (ref 0.0–0.7)
EOS PCT: 3 %
HCT: 32.4 % — ABNORMAL LOW (ref 39.0–52.0)
Hemoglobin: 10.2 g/dL — ABNORMAL LOW (ref 13.0–17.0)
LYMPHS ABS: 1.1 10*3/uL (ref 0.7–4.0)
Lymphocytes Relative: 18 %
MCH: 28.8 pg (ref 26.0–34.0)
MCHC: 31.5 g/dL (ref 30.0–36.0)
MCV: 91.5 fL (ref 78.0–100.0)
MONOS PCT: 6 %
Monocytes Absolute: 0.3 10*3/uL (ref 0.1–1.0)
Neutro Abs: 4.4 10*3/uL (ref 1.7–7.7)
Neutrophils Relative %: 72 %
PLATELETS: 345 10*3/uL (ref 150–400)
RBC: 3.54 MIL/uL — AB (ref 4.22–5.81)
RDW: 13.4 % (ref 11.5–15.5)
WBC: 6.1 10*3/uL (ref 4.0–10.5)

## 2015-02-09 LAB — C DIFFICILE QUICK SCREEN W PCR REFLEX
C DIFFICILE (CDIFF) INTERP: NEGATIVE
C DIFFICLE (CDIFF) ANTIGEN: NEGATIVE
C Diff toxin: NEGATIVE

## 2015-02-09 LAB — CULTURE, BLOOD (ROUTINE X 2)
CULTURE: NO GROWTH
Culture: NO GROWTH

## 2015-02-09 LAB — PROCALCITONIN: PROCALCITONIN: 0.29 ng/mL

## 2015-02-09 LAB — PHOSPHORUS: PHOSPHORUS: 2.5 mg/dL (ref 2.5–4.6)

## 2015-02-09 LAB — MAGNESIUM: Magnesium: 2.3 mg/dL (ref 1.7–2.4)

## 2015-02-11 ENCOUNTER — Other Ambulatory Visit (HOSPITAL_COMMUNITY): Payer: Self-pay

## 2015-02-11 LAB — BASIC METABOLIC PANEL
Anion gap: 7 (ref 5–15)
BUN: 26 mg/dL — AB (ref 6–20)
CALCIUM: 8.6 mg/dL — AB (ref 8.9–10.3)
CHLORIDE: 116 mmol/L — AB (ref 101–111)
CO2: 26 mmol/L (ref 22–32)
CREATININE: 0.88 mg/dL (ref 0.61–1.24)
Glucose, Bld: 381 mg/dL — ABNORMAL HIGH (ref 65–99)
POTASSIUM: 4.8 mmol/L (ref 3.5–5.1)
SODIUM: 149 mmol/L — AB (ref 135–145)

## 2015-02-11 LAB — URINALYSIS, ROUTINE W REFLEX MICROSCOPIC
BILIRUBIN URINE: NEGATIVE
HGB URINE DIPSTICK: NEGATIVE
KETONES UR: NEGATIVE mg/dL
Leukocytes, UA: NEGATIVE
NITRITE: NEGATIVE
PROTEIN: NEGATIVE mg/dL
Specific Gravity, Urine: 1.029 (ref 1.005–1.030)
UROBILINOGEN UA: 0.2 mg/dL (ref 0.0–1.0)
pH: 5 (ref 5.0–8.0)

## 2015-02-11 LAB — URINE MICROSCOPIC-ADD ON

## 2015-02-11 LAB — CBC WITH DIFFERENTIAL/PLATELET
BASOS ABS: 0 10*3/uL (ref 0.0–0.1)
Basophils Relative: 0 %
Eosinophils Absolute: 0.1 10*3/uL (ref 0.0–0.7)
Eosinophils Relative: 2 %
HEMATOCRIT: 33.1 % — AB (ref 39.0–52.0)
HEMOGLOBIN: 10.3 g/dL — AB (ref 13.0–17.0)
LYMPHS PCT: 13 %
Lymphs Abs: 0.9 10*3/uL (ref 0.7–4.0)
MCH: 28.1 pg (ref 26.0–34.0)
MCHC: 31.1 g/dL (ref 30.0–36.0)
MCV: 90.4 fL (ref 78.0–100.0)
MONO ABS: 0.4 10*3/uL (ref 0.1–1.0)
Monocytes Relative: 6 %
NEUTROS ABS: 5.4 10*3/uL (ref 1.7–7.7)
NEUTROS PCT: 79 %
Platelets: 354 10*3/uL (ref 150–400)
RBC: 3.66 MIL/uL — ABNORMAL LOW (ref 4.22–5.81)
RDW: 13.4 % (ref 11.5–15.5)
WBC: 6.8 10*3/uL (ref 4.0–10.5)

## 2015-02-11 LAB — EXPECTORATED SPUTUM ASSESSMENT W GRAM STAIN, RFLX TO RESP C

## 2015-02-11 LAB — AMYLASE: Amylase: 220 U/L — ABNORMAL HIGH (ref 28–100)

## 2015-02-11 LAB — PHOSPHORUS: Phosphorus: 2.9 mg/dL (ref 2.5–4.6)

## 2015-02-11 LAB — TSH: TSH: 0.922 u[IU]/mL (ref 0.350–4.500)

## 2015-02-11 LAB — EXPECTORATED SPUTUM ASSESSMENT W REFEX TO RESP CULTURE

## 2015-02-11 LAB — MAGNESIUM: MAGNESIUM: 2.4 mg/dL (ref 1.7–2.4)

## 2015-02-12 ENCOUNTER — Other Ambulatory Visit (HOSPITAL_COMMUNITY): Payer: Self-pay

## 2015-02-14 LAB — CBC WITH DIFFERENTIAL/PLATELET
BASOS PCT: 0 %
Basophils Absolute: 0 10*3/uL (ref 0.0–0.1)
EOS ABS: 0.3 10*3/uL (ref 0.0–0.7)
Eosinophils Relative: 4 %
HCT: 32.1 % — ABNORMAL LOW (ref 39.0–52.0)
Hemoglobin: 9.7 g/dL — ABNORMAL LOW (ref 13.0–17.0)
LYMPHS ABS: 1.2 10*3/uL (ref 0.7–4.0)
LYMPHS PCT: 17 %
MCH: 27.3 pg (ref 26.0–34.0)
MCHC: 30.2 g/dL (ref 30.0–36.0)
MCV: 90.4 fL (ref 78.0–100.0)
MONO ABS: 0.3 10*3/uL (ref 0.1–1.0)
Monocytes Relative: 5 %
NEUTROS PCT: 74 %
Neutro Abs: 5.1 10*3/uL (ref 1.7–7.7)
PLATELETS: 272 10*3/uL (ref 150–400)
RBC: 3.55 MIL/uL — ABNORMAL LOW (ref 4.22–5.81)
RDW: 13.4 % (ref 11.5–15.5)
WBC: 6.9 10*3/uL (ref 4.0–10.5)

## 2015-02-14 LAB — CULTURE, RESPIRATORY W GRAM STAIN

## 2015-02-14 LAB — BASIC METABOLIC PANEL
Anion gap: 9 (ref 5–15)
BUN: 18 mg/dL (ref 6–20)
CALCIUM: 8.6 mg/dL — AB (ref 8.9–10.3)
CO2: 27 mmol/L (ref 22–32)
CREATININE: 0.76 mg/dL (ref 0.61–1.24)
Chloride: 112 mmol/L — ABNORMAL HIGH (ref 101–111)
GFR calc Af Amer: >60 mL/min (ref >60)
Glucose, Bld: 334 mg/dL — ABNORMAL HIGH (ref 65–99)
POTASSIUM: 4 mmol/L (ref 3.5–5.1)
SODIUM: 148 mmol/L — AB (ref 135–145)

## 2015-02-14 LAB — CULTURE, RESPIRATORY

## 2015-02-14 LAB — PROCALCITONIN: PROCALCITONIN: 0.31 ng/mL

## 2015-02-15 ENCOUNTER — Institutional Professional Consult (permissible substitution) (HOSPITAL_COMMUNITY): Payer: Self-pay

## 2015-02-15 LAB — HEMOGLOBIN A1C
HEMOGLOBIN A1C: 11.4 % — AB (ref 4.8–5.6)
Mean Plasma Glucose: 280 mg/dL

## 2015-02-15 LAB — AMIODARONE LEVEL
AMIODARONE LVL: NOT DETECTED ug/mL (ref 1.0–2.5)
N-DESETHYL-AMIODARONE: NOT DETECTED ug/mL (ref 1.0–2.5)

## 2015-02-15 LAB — URINALYSIS, ROUTINE W REFLEX MICROSCOPIC
BILIRUBIN URINE: NEGATIVE
Ketones, ur: NEGATIVE mg/dL
Leukocytes, UA: NEGATIVE
Nitrite: NEGATIVE
PROTEIN: NEGATIVE mg/dL
Specific Gravity, Urine: 1.031 — ABNORMAL HIGH (ref 1.005–1.030)
UROBILINOGEN UA: 0.2 mg/dL (ref 0.0–1.0)
pH: 5 (ref 5.0–8.0)

## 2015-02-15 LAB — URINE MICROSCOPIC-ADD ON

## 2015-02-16 LAB — BASIC METABOLIC PANEL
ANION GAP: 7 (ref 5–15)
BUN: 11 mg/dL (ref 6–20)
CALCIUM: 8.6 mg/dL — AB (ref 8.9–10.3)
CO2: 26 mmol/L (ref 22–32)
Chloride: 111 mmol/L (ref 101–111)
Creatinine, Ser: 0.58 mg/dL — ABNORMAL LOW (ref 0.61–1.24)
Glucose, Bld: 141 mg/dL — ABNORMAL HIGH (ref 65–99)
POTASSIUM: 4 mmol/L (ref 3.5–5.1)
SODIUM: 144 mmol/L (ref 135–145)

## 2015-02-16 LAB — CULTURE, BLOOD (ROUTINE X 2)
CULTURE: NO GROWTH
Culture: NO GROWTH

## 2015-02-16 LAB — CBC WITH DIFFERENTIAL/PLATELET
BASOS ABS: 0 10*3/uL (ref 0.0–0.1)
BASOS PCT: 0 %
Eosinophils Absolute: 0.2 10*3/uL (ref 0.0–0.7)
Eosinophils Relative: 3 %
HEMATOCRIT: 28.5 % — AB (ref 39.0–52.0)
Hemoglobin: 9.2 g/dL — ABNORMAL LOW (ref 13.0–17.0)
LYMPHS PCT: 24 %
Lymphs Abs: 1.7 10*3/uL (ref 0.7–4.0)
MCH: 28.3 pg (ref 26.0–34.0)
MCHC: 32.3 g/dL (ref 30.0–36.0)
MCV: 87.7 fL (ref 78.0–100.0)
Monocytes Absolute: 0.5 10*3/uL (ref 0.1–1.0)
Monocytes Relative: 7 %
NEUTROS ABS: 4.6 10*3/uL (ref 1.7–7.7)
NEUTROS PCT: 66 %
Platelets: 242 10*3/uL (ref 150–400)
RBC: 3.25 MIL/uL — AB (ref 4.22–5.81)
RDW: 13.3 % (ref 11.5–15.5)
WBC: 6.9 10*3/uL (ref 4.0–10.5)

## 2015-02-16 LAB — URINE CULTURE: Culture: NO GROWTH

## 2015-02-18 LAB — BASIC METABOLIC PANEL
Anion gap: 8 (ref 5–15)
BUN: 12 mg/dL (ref 6–20)
CALCIUM: 8.5 mg/dL — AB (ref 8.9–10.3)
CO2: 28 mmol/L (ref 22–32)
CREATININE: 0.56 mg/dL — AB (ref 0.61–1.24)
Chloride: 103 mmol/L (ref 101–111)
Glucose, Bld: 265 mg/dL — ABNORMAL HIGH (ref 65–99)
Potassium: 4.6 mmol/L (ref 3.5–5.1)
SODIUM: 139 mmol/L (ref 135–145)

## 2015-02-18 LAB — CBC WITH DIFFERENTIAL/PLATELET
BASOS ABS: 0 10*3/uL (ref 0.0–0.1)
BASOS PCT: 0 %
Eosinophils Absolute: 0.1 10*3/uL (ref 0.0–0.7)
Eosinophils Relative: 3 %
HEMATOCRIT: 28.9 % — AB (ref 39.0–52.0)
HEMOGLOBIN: 9.1 g/dL — AB (ref 13.0–17.0)
LYMPHS PCT: 23 %
Lymphs Abs: 1.1 10*3/uL (ref 0.7–4.0)
MCH: 27.1 pg (ref 26.0–34.0)
MCHC: 31.5 g/dL (ref 30.0–36.0)
MCV: 86 fL (ref 78.0–100.0)
MONO ABS: 0.3 10*3/uL (ref 0.1–1.0)
Monocytes Relative: 6 %
NEUTROS ABS: 3.4 10*3/uL (ref 1.7–7.7)
NEUTROS PCT: 68 %
PLATELETS: 242 10*3/uL (ref 150–400)
RBC: 3.36 MIL/uL — AB (ref 4.22–5.81)
RDW: 13.4 % (ref 11.5–15.5)
WBC: 5 10*3/uL (ref 4.0–10.5)

## 2015-02-22 LAB — BASIC METABOLIC PANEL
Anion gap: 8 (ref 5–15)
BUN: 9 mg/dL (ref 6–20)
CHLORIDE: 99 mmol/L — AB (ref 101–111)
CO2: 30 mmol/L (ref 22–32)
CREATININE: 0.6 mg/dL — AB (ref 0.61–1.24)
Calcium: 8.7 mg/dL — ABNORMAL LOW (ref 8.9–10.3)
GFR calc Af Amer: >60 mL/min (ref >60)
GFR calc non Af Amer: >60 mL/min (ref >60)
GLUCOSE: 143 mg/dL — AB (ref 65–99)
POTASSIUM: 3.8 mmol/L (ref 3.5–5.1)
SODIUM: 137 mmol/L (ref 135–145)

## 2015-02-22 LAB — MAGNESIUM: MAGNESIUM: 1.9 mg/dL (ref 1.7–2.4)

## 2015-02-22 LAB — CBC
HCT: 29.3 % — ABNORMAL LOW (ref 39.0–52.0)
HEMOGLOBIN: 9.2 g/dL — AB (ref 13.0–17.0)
MCH: 26.7 pg (ref 26.0–34.0)
MCHC: 31.4 g/dL (ref 30.0–36.0)
MCV: 85.2 fL (ref 78.0–100.0)
PLATELETS: 279 10*3/uL (ref 150–400)
RBC: 3.44 MIL/uL — AB (ref 4.22–5.81)
RDW: 13.6 % (ref 11.5–15.5)
WBC: 5.4 10*3/uL (ref 4.0–10.5)

## 2015-02-22 LAB — PHOSPHORUS: Phosphorus: 3.2 mg/dL (ref 2.5–4.6)

## 2015-02-22 NOTE — Progress Notes (Signed)
Patient ID: Albert Jensen, male   DOB: 07/18/1970, 44 y.o.   MRN: [id number] Aware of request for gastrostomy tube placement in pt. Will order CT abd to fully assess anatomy prior to scheduling tube placement and make further recommendations once results available.

## 2015-02-24 LAB — BASIC METABOLIC PANEL
Anion gap: 9 (ref 5–15)
BUN: 10 mg/dL (ref 6–20)
CALCIUM: 8.8 mg/dL — AB (ref 8.9–10.3)
CO2: 29 mmol/L (ref 22–32)
Chloride: 101 mmol/L (ref 101–111)
Creatinine, Ser: 0.63 mg/dL (ref 0.61–1.24)
GFR calc Af Amer: >60 mL/min (ref >60)
GLUCOSE: 112 mg/dL — AB (ref 65–99)
Potassium: 3.9 mmol/L (ref 3.5–5.1)
Sodium: 139 mmol/L (ref 135–145)

## 2015-02-25 ENCOUNTER — Institutional Professional Consult (permissible substitution) (HOSPITAL_COMMUNITY): Payer: Self-pay

## 2015-02-25 ENCOUNTER — Other Ambulatory Visit (HOSPITAL_COMMUNITY): Payer: Self-pay

## 2015-02-25 LAB — BLOOD GAS, ARTERIAL
ACID-BASE EXCESS: 3.5 mmol/L — AB (ref 0.0–2.0)
BICARBONATE: 26.6 meq/L — AB (ref 20.0–24.0)
FIO2: 0.21
O2 SAT: 90.3 %
PATIENT TEMPERATURE: 102.4
PO2 ART: 67.1 mmHg — AB (ref 80.0–100.0)
TCO2: 27.7 mmol/L (ref 0–100)
pCO2 arterial: 37.8 mmHg (ref 35.0–45.0)
pH, Arterial: 7.472 — ABNORMAL HIGH (ref 7.350–7.450)

## 2015-02-25 LAB — CBC WITH DIFFERENTIAL/PLATELET
Basophils Absolute: 0 10*3/uL (ref 0.0–0.1)
Basophils Relative: 0 %
Eosinophils Absolute: 0.1 10*3/uL (ref 0.0–0.7)
Eosinophils Relative: 1 %
HCT: 29.6 % — ABNORMAL LOW (ref 39.0–52.0)
HEMOGLOBIN: 9.5 g/dL — AB (ref 13.0–17.0)
LYMPHS ABS: 1 10*3/uL (ref 0.7–4.0)
LYMPHS PCT: 11 %
MCH: 27.1 pg (ref 26.0–34.0)
MCHC: 32.1 g/dL (ref 30.0–36.0)
MCV: 84.6 fL (ref 78.0–100.0)
MONOS PCT: 4 %
Monocytes Absolute: 0.4 10*3/uL (ref 0.1–1.0)
NEUTROS PCT: 84 %
Neutro Abs: 7.8 10*3/uL — ABNORMAL HIGH (ref 1.7–7.7)
Platelets: 331 10*3/uL (ref 150–400)
RBC: 3.5 MIL/uL — AB (ref 4.22–5.81)
RDW: 14.1 % (ref 11.5–15.5)
WBC: 9.3 10*3/uL (ref 4.0–10.5)

## 2015-02-25 LAB — URINALYSIS, ROUTINE W REFLEX MICROSCOPIC
Bilirubin Urine: NEGATIVE
Glucose, UA: NEGATIVE mg/dL
Ketones, ur: 15 mg/dL — AB
NITRITE: POSITIVE — AB
PROTEIN: 100 mg/dL — AB
SPECIFIC GRAVITY, URINE: 1.021 (ref 1.005–1.030)
pH: 8 (ref 5.0–8.0)

## 2015-02-25 LAB — URINE MICROSCOPIC-ADD ON

## 2015-02-25 LAB — BASIC METABOLIC PANEL
ANION GAP: 9 (ref 5–15)
BUN: 7 mg/dL (ref 6–20)
CALCIUM: 8.7 mg/dL — AB (ref 8.9–10.3)
CHLORIDE: 98 mmol/L — AB (ref 101–111)
CO2: 30 mmol/L (ref 22–32)
Creatinine, Ser: 0.61 mg/dL (ref 0.61–1.24)
GFR calc non Af Amer: >60 mL/min (ref >60)
GLUCOSE: 123 mg/dL — AB (ref 65–99)
POTASSIUM: 3.8 mmol/L (ref 3.5–5.1)
Sodium: 137 mmol/L (ref 135–145)

## 2015-02-25 LAB — PHOSPHORUS: PHOSPHORUS: 3.2 mg/dL (ref 2.5–4.6)

## 2015-02-25 LAB — MAGNESIUM: Magnesium: 1.7 mg/dL (ref 1.7–2.4)

## 2015-02-26 ENCOUNTER — Institutional Professional Consult (permissible substitution) (HOSPITAL_COMMUNITY): Payer: Self-pay

## 2015-02-26 LAB — COMPREHENSIVE METABOLIC PANEL
ALBUMIN: 1.9 g/dL — AB (ref 3.5–5.0)
ALT: 16 U/L — ABNORMAL LOW (ref 17–63)
AST: 15 U/L (ref 15–41)
Alkaline Phosphatase: 76 U/L (ref 38–126)
Anion gap: 8 (ref 5–15)
BILIRUBIN TOTAL: 0.8 mg/dL (ref 0.3–1.2)
BUN: 13 mg/dL (ref 6–20)
CHLORIDE: 101 mmol/L (ref 101–111)
CO2: 27 mmol/L (ref 22–32)
Calcium: 8.3 mg/dL — ABNORMAL LOW (ref 8.9–10.3)
Creatinine, Ser: 0.63 mg/dL (ref 0.61–1.24)
GFR calc Af Amer: >60 mL/min (ref >60)
GFR calc non Af Amer: >60 mL/min (ref >60)
GLUCOSE: 202 mg/dL — AB (ref 65–99)
POTASSIUM: 3.5 mmol/L (ref 3.5–5.1)
Sodium: 136 mmol/L (ref 135–145)
Total Protein: 6.1 g/dL — ABNORMAL LOW (ref 6.5–8.1)

## 2015-02-26 LAB — CBC WITH DIFFERENTIAL/PLATELET
BASOS ABS: 0 10*3/uL (ref 0.0–0.1)
BASOS PCT: 0 %
Eosinophils Absolute: 0 10*3/uL (ref 0.0–0.7)
Eosinophils Relative: 0 %
HEMATOCRIT: 28.1 % — AB (ref 39.0–52.0)
Hemoglobin: 8.6 g/dL — ABNORMAL LOW (ref 13.0–17.0)
Lymphocytes Relative: 10 %
Lymphs Abs: 1.4 10*3/uL (ref 0.7–4.0)
MCH: 26.1 pg (ref 26.0–34.0)
MCHC: 30.6 g/dL (ref 30.0–36.0)
MCV: 85.2 fL (ref 78.0–100.0)
MONO ABS: 0.9 10*3/uL (ref 0.1–1.0)
Monocytes Relative: 6 %
NEUTROS ABS: 11.3 10*3/uL — AB (ref 1.7–7.7)
NEUTROS PCT: 84 %
PLATELETS: 333 10*3/uL (ref 150–400)
RBC: 3.3 MIL/uL — ABNORMAL LOW (ref 4.22–5.81)
RDW: 14.3 % (ref 11.5–15.5)
WBC: 13.5 10*3/uL — AB (ref 4.0–10.5)

## 2015-02-26 LAB — MAGNESIUM: Magnesium: 1.9 mg/dL (ref 1.7–2.4)

## 2015-02-26 LAB — PHOSPHORUS: Phosphorus: 3.6 mg/dL (ref 2.5–4.6)

## 2015-02-27 LAB — URINE CULTURE: Culture: >=100000

## 2015-02-28 LAB — BASIC METABOLIC PANEL
Anion gap: 6 (ref 5–15)
BUN: 10 mg/dL (ref 6–20)
CHLORIDE: 101 mmol/L (ref 101–111)
CO2: 30 mmol/L (ref 22–32)
Calcium: 8.6 mg/dL — ABNORMAL LOW (ref 8.9–10.3)
Creatinine, Ser: 0.58 mg/dL — ABNORMAL LOW (ref 0.61–1.24)
GFR calc Af Amer: >60 mL/min (ref >60)
GFR calc non Af Amer: >60 mL/min (ref >60)
GLUCOSE: 157 mg/dL — AB (ref 65–99)
POTASSIUM: 4.1 mmol/L (ref 3.5–5.1)
Sodium: 137 mmol/L (ref 135–145)

## 2015-02-28 LAB — CBC WITH DIFFERENTIAL/PLATELET
Basophils Absolute: 0 10*3/uL (ref 0.0–0.1)
Basophils Relative: 0 %
Eosinophils Absolute: 0.1 10*3/uL (ref 0.0–0.7)
Eosinophils Relative: 3 %
HEMATOCRIT: 27.4 % — AB (ref 39.0–52.0)
HEMOGLOBIN: 8.5 g/dL — AB (ref 13.0–17.0)
LYMPHS ABS: 1.1 10*3/uL (ref 0.7–4.0)
LYMPHS PCT: 21 %
MCH: 26.2 pg (ref 26.0–34.0)
MCHC: 31 g/dL (ref 30.0–36.0)
MCV: 84.3 fL (ref 78.0–100.0)
Monocytes Absolute: 0.3 10*3/uL (ref 0.1–1.0)
Monocytes Relative: 7 %
NEUTROS ABS: 3.7 10*3/uL (ref 1.7–7.7)
NEUTROS PCT: 69 %
Platelets: 331 10*3/uL (ref 150–400)
RBC: 3.25 MIL/uL — AB (ref 4.22–5.81)
RDW: 14 % (ref 11.5–15.5)
WBC: 5.2 10*3/uL (ref 4.0–10.5)

## 2015-03-01 LAB — CULTURE, RESPIRATORY W GRAM STAIN

## 2015-03-01 LAB — CULTURE, RESPIRATORY

## 2015-03-02 LAB — CBC WITH DIFFERENTIAL/PLATELET
Basophils Absolute: 0 10*3/uL (ref 0.0–0.1)
Basophils Relative: 0 %
EOS ABS: 0.1 10*3/uL (ref 0.0–0.7)
Eosinophils Relative: 3 %
HEMATOCRIT: 31.4 % — AB (ref 39.0–52.0)
HEMOGLOBIN: 9.9 g/dL — AB (ref 13.0–17.0)
Lymphocytes Relative: 21 %
Lymphs Abs: 1.1 10*3/uL (ref 0.7–4.0)
MCH: 26.8 pg (ref 26.0–34.0)
MCHC: 31.5 g/dL (ref 30.0–36.0)
MCV: 84.9 fL (ref 78.0–100.0)
MONOS PCT: 11 %
Monocytes Absolute: 0.6 10*3/uL (ref 0.1–1.0)
NEUTROS PCT: 65 %
Neutro Abs: 3.7 10*3/uL (ref 1.7–7.7)
Platelets: 378 10*3/uL (ref 150–400)
RBC: 3.7 MIL/uL — AB (ref 4.22–5.81)
RDW: 14.3 % (ref 11.5–15.5)
WBC: 5.6 10*3/uL (ref 4.0–10.5)

## 2015-03-02 LAB — BASIC METABOLIC PANEL
Anion gap: 8 (ref 5–15)
BUN: 17 mg/dL (ref 6–20)
CHLORIDE: 102 mmol/L (ref 101–111)
CO2: 29 mmol/L (ref 22–32)
CREATININE: 0.6 mg/dL — AB (ref 0.61–1.24)
Calcium: 9.1 mg/dL (ref 8.9–10.3)
GFR calc Af Amer: >60 mL/min (ref >60)
GFR calc non Af Amer: >60 mL/min (ref >60)
Glucose, Bld: 145 mg/dL — ABNORMAL HIGH (ref 65–99)
POTASSIUM: 4.4 mmol/L (ref 3.5–5.1)
Sodium: 139 mmol/L (ref 135–145)

## 2015-03-03 LAB — CULTURE, BLOOD (ROUTINE X 2)
CULTURE: NO GROWTH
CULTURE: NO GROWTH

## 2015-03-07 ENCOUNTER — Other Ambulatory Visit (HOSPITAL_COMMUNITY): Payer: Managed Care, Other (non HMO)

## 2015-03-07 LAB — CBC WITH DIFFERENTIAL/PLATELET
BASOS PCT: 0 %
Basophils Absolute: 0 10*3/uL (ref 0.0–0.1)
EOS ABS: 0.1 10*3/uL (ref 0.0–0.7)
Eosinophils Relative: 1 %
HCT: 28.5 % — ABNORMAL LOW (ref 39.0–52.0)
HEMOGLOBIN: 9 g/dL — AB (ref 13.0–17.0)
Lymphocytes Relative: 26 %
Lymphs Abs: 1.5 10*3/uL (ref 0.7–4.0)
MCH: 26.5 pg (ref 26.0–34.0)
MCHC: 31.6 g/dL (ref 30.0–36.0)
MCV: 83.8 fL (ref 78.0–100.0)
Monocytes Absolute: 0.4 10*3/uL (ref 0.1–1.0)
Monocytes Relative: 7 %
NEUTROS PCT: 66 %
Neutro Abs: 4 10*3/uL (ref 1.7–7.7)
Platelets: 384 10*3/uL (ref 150–400)
RBC: 3.4 MIL/uL — AB (ref 4.22–5.81)
RDW: 15.4 % (ref 11.5–15.5)
WBC: 6 10*3/uL (ref 4.0–10.5)

## 2015-03-07 LAB — BASIC METABOLIC PANEL
ANION GAP: 10 (ref 5–15)
BUN: 7 mg/dL (ref 6–20)
CALCIUM: 9.1 mg/dL (ref 8.9–10.3)
CHLORIDE: 100 mmol/L — AB (ref 101–111)
CO2: 27 mmol/L (ref 22–32)
CREATININE: 0.57 mg/dL — AB (ref 0.61–1.24)
GFR calc non Af Amer: >60 mL/min (ref >60)
Glucose, Bld: 137 mg/dL — ABNORMAL HIGH (ref 65–99)
POTASSIUM: 3.6 mmol/L (ref 3.5–5.1)
SODIUM: 137 mmol/L (ref 135–145)

## 2015-03-09 LAB — CK TOTAL AND CKMB (NOT AT ARMC)
CK TOTAL: 32 U/L — AB (ref 49–397)
CK, MB: 4.2 ng/mL (ref 0.5–5.0)
Relative Index: INVALID (ref 0.0–2.5)

## 2015-03-09 LAB — T4, FREE: FREE T4: 1.25 ng/dL — AB (ref 0.61–1.12)

## 2015-03-09 LAB — URINALYSIS, ROUTINE W REFLEX MICROSCOPIC
Glucose, UA: NEGATIVE mg/dL
Ketones, ur: 15 mg/dL — AB
NITRITE: POSITIVE — AB
PH: 5.5 (ref 5.0–8.0)
Protein, ur: >300 mg/dL — AB
SPECIFIC GRAVITY, URINE: 1.02 (ref 1.005–1.030)

## 2015-03-09 LAB — TROPONIN I

## 2015-03-09 LAB — URINE MICROSCOPIC-ADD ON

## 2015-03-09 LAB — TSH: TSH: 1.956 u[IU]/mL (ref 0.350–4.500)

## 2015-03-10 LAB — BASIC METABOLIC PANEL
Anion gap: 9 (ref 5–15)
BUN: 5 mg/dL — AB (ref 6–20)
CHLORIDE: 101 mmol/L (ref 101–111)
CO2: 26 mmol/L (ref 22–32)
Calcium: 8.8 mg/dL — ABNORMAL LOW (ref 8.9–10.3)
Creatinine, Ser: 0.51 mg/dL — ABNORMAL LOW (ref 0.61–1.24)
GFR calc Af Amer: >60 mL/min (ref >60)
GFR calc non Af Amer: >60 mL/min (ref >60)
GLUCOSE: 123 mg/dL — AB (ref 65–99)
POTASSIUM: 3.6 mmol/L (ref 3.5–5.1)
Sodium: 136 mmol/L (ref 135–145)

## 2015-03-11 LAB — CBC WITH DIFFERENTIAL/PLATELET
BASOS ABS: 0 10*3/uL (ref 0.0–0.1)
Basophils Relative: 0 %
Eosinophils Absolute: 0.1 10*3/uL (ref 0.0–0.7)
Eosinophils Relative: 2 %
HCT: 30 % — ABNORMAL LOW (ref 39.0–52.0)
HEMOGLOBIN: 9.2 g/dL — AB (ref 13.0–17.0)
LYMPHS ABS: 1.6 10*3/uL (ref 0.7–4.0)
LYMPHS PCT: 26 %
MCH: 26 pg (ref 26.0–34.0)
MCHC: 30.7 g/dL (ref 30.0–36.0)
MCV: 84.7 fL (ref 78.0–100.0)
Monocytes Absolute: 0.6 10*3/uL (ref 0.1–1.0)
Monocytes Relative: 10 %
NEUTROS PCT: 62 %
Neutro Abs: 3.8 10*3/uL (ref 1.7–7.7)
Platelets: 367 10*3/uL (ref 150–400)
RBC: 3.54 MIL/uL — AB (ref 4.22–5.81)
RDW: 15.5 % (ref 11.5–15.5)
WBC: 6.1 10*3/uL (ref 4.0–10.5)

## 2015-03-11 LAB — BASIC METABOLIC PANEL
ANION GAP: 12 (ref 5–15)
BUN: <5 mg/dL — ABNORMAL LOW (ref 6–20)
CALCIUM: 9.1 mg/dL (ref 8.9–10.3)
CHLORIDE: 100 mmol/L — AB (ref 101–111)
CO2: 26 mmol/L (ref 22–32)
CREATININE: 0.58 mg/dL — AB (ref 0.61–1.24)
GFR calc Af Amer: >60 mL/min (ref >60)
Glucose, Bld: 102 mg/dL — ABNORMAL HIGH (ref 65–99)
POTASSIUM: 3.9 mmol/L (ref 3.5–5.1)
SODIUM: 138 mmol/L (ref 135–145)

## 2015-03-11 LAB — URINE CULTURE: Culture: >=100000

## 2015-03-25 ENCOUNTER — Ambulatory Visit (INDEPENDENT_AMBULATORY_CARE_PROVIDER_SITE_OTHER): Payer: Managed Care, Other (non HMO) | Admitting: Cardiology

## 2015-03-25 ENCOUNTER — Encounter: Payer: Self-pay | Admitting: Cardiology

## 2015-03-25 VITALS — BP 124/80 | HR 116 | Ht 74.0 in

## 2015-03-25 DIAGNOSIS — E785 Hyperlipidemia, unspecified: Secondary | ICD-10-CM | POA: Diagnosis not present

## 2015-03-25 DIAGNOSIS — I10 Essential (primary) hypertension: Secondary | ICD-10-CM | POA: Diagnosis not present

## 2015-03-25 DIAGNOSIS — I2510 Atherosclerotic heart disease of native coronary artery without angina pectoris: Secondary | ICD-10-CM

## 2015-03-25 DIAGNOSIS — I2119 ST elevation (STEMI) myocardial infarction involving other coronary artery of inferior wall: Secondary | ICD-10-CM

## 2015-03-25 DIAGNOSIS — I472 Ventricular tachycardia: Secondary | ICD-10-CM

## 2015-03-25 DIAGNOSIS — I4729 Other ventricular tachycardia: Secondary | ICD-10-CM

## 2015-03-25 MED ORDER — METOPROLOL TARTRATE 50 MG PO TABS: 50.0000 mg | ORAL_TABLET | Freq: Two times a day (BID) | ORAL | Status: DC

## 2015-03-25 NOTE — Progress Notes (Signed)
Cardiology Office Note  Date: 03/25/2015   ID: Albert Jensen, DOB [birth year], MRN [id number]  PCP: Toma Deiters, MD  Primary Cardiologist: Nona Dell, MD   Chief Complaint  Patient presents with  . Coronary Artery Disease    History of Present Illness: Albert Jensen is a medically complex 44 y.o. male former patient of Dr. Ladona Ridgel last seen in 2012, now referred to the office to reestablish cardiology follow-up after a complex hospital stay back in October and November. This is our first meeting in the office. I reviewed extensive records and updated the chart. He was brought here today by EMS in transfer from the [facility]. He is very limited, it sounds like essentially bedbound at this time, has a tracheostomy in place, and provides very limited communication. He is able to indicate with a thumbs up or thumbs down, yes or no answers.  There was significant confusion as to his medication list based on chart review. I had nursing contact his facility to clarify his list and it was updated as outlined below. We are told that he takes his medications by mouth, they are crushed. He does not have a feeding tube in place.  In review of his medications, present cardiac regimen includes aspirin, Proventil, digoxin, Lopressor, Lipitor, also a potassium supplement, although I do not see a diuretic.  Heart rate today is elevated. His ECG shows probable sinus tachycardia with PVCs, cannot completely exclude an ectopic atrial rhythm. He does not indicate the presence of any recent chest pain.  Results of cardiac catheterization from October as well as echocardiogram are outlined below. I reviewed his lab work from November as well.  Past Medical History  Diagnosis Date  . Essential hypertension   . Type 2 diabetes mellitus (HCC)   . RVOT-VT (right ventricular outflow tract ventricular tachycardia) (HCC)     Evaluated by Dr. Ladona Ridgel in 2012  . ST elevation myocardial infarction (STEMI) of  inferolateral wall [facility])     October 2016   . CAD (coronary artery disease)     DES proximal RCA and aspiration thrombectomy mid RCA and PLB 01/2015  . Cardiac arrest [facility])     October 2016 with STEMI  . Anoxic encephalopathy (HCC)   . Respiratory failure (HCC)     Prolonged ventilatory course, complicated by mucus plugging and ultimately required tracheostomy 10-02/2015  . Ventilator associated pneumonia [facility])     Past Surgical History  Procedure Laterality Date  . Cardiac catheterization N/A 01/22/2015    Procedure: Left Heart Cath and Coronary Angiography;  Surgeon: Corky Crafts, MD;  Location[facility] INVASIVE CV LAB;  Service: Cardiovascular;  Laterality: N/A;  . Cardiac catheterization N/A 01/22/2015    Procedure: Coronary Stent Intervention;  Surgeon: Corky Crafts, MD;  Location: [facility] INVASIVE CV LAB;  Service: Cardiovascular;  Laterality: N/A;  prox rca    Current Outpatient Prescriptions  Medication Sig Dispense Refill  . acetaminophen (TYLENOL) 160 MG/5ML solution Take 20.3 mLs (650 mg total) by mouth every 6 (six) hours as needed for mild pain, headache or fever. 120 mL 0  . aspirin 81 MG chewable tablet Place 1 tablet (81 mg total) into feeding tube daily.    Marland Kitchen atorvastatin (LIPITOR) 80 MG tablet Place 1 tablet (80 mg total) into feeding tube daily at 6 PM.    . collagenase (SANTYL) ointment Apply topically daily. Apply to sacral ulcer daily 15 g 0  . Dextrose-Sodium Chloride (DEXTROSE 5 % AND 0.45% NACL)  infusion Inject 40 mL/hr into the vein continuous.    Marland Kitchen digoxin (LANOXIN) 0.25 MG tablet Take 0.25 mg by mouth daily. HR > 60 hold    . fentaNYL (SUBLIMAZE) 100 MCG/2ML injection Inject 1-2 mLs (50-100 mcg total) into the vein every 2 (two) hours as needed (to maintain RASS goal.). 2 mL 0  . insulin aspart (NOVOLOG) 100 UNIT/ML injection Inject 4 Units into the skin every 4 (four) hours. 10 mL 11  . insulin aspart (NOVOLOG) 100 UNIT/ML injection CBG < 70: implement  hypoglycemia protocol CBG 70 - 120: 0 units CBG 121 - 150: 3 units CBG 151 - 200: 4 units CBG 201 - 250: 7 units CBG 251 - 300: 11 units CBG 301 - 350: 15 units CBG 351 - 400: 20 units CBG > 400: call MD and obtain STAT lab verification 10 mL 11  . insulin glargine (LANTUS) 100 UNIT/ML injection Inject 0.3 mLs (30 Units total) into the skin daily. 10 mL 11  . modafinil (PROVIGIL) 200 MG tablet Take 200 mg by mouth daily.    . Nutritional Supplements (FEEDING SUPPLEMENT, GLUCERNA 1.2 CAL,) LIQD Place 80 mL/hr into feeding tube continuous.    Marland Kitchen nystatin (MYCOSTATIN) 100000 UNIT/ML suspension Take 5 mLs (500,000 Units total) by mouth 4 (four) times daily. 60 mL 0  . ondansetron (ZOFRAN) 4 MG/2ML SOLN injection Inject 2 mLs (4 mg total) into the vein every 6 (six) hours as needed for nausea. 2 mL 0  . pantoprazole sodium (PROTONIX) 40 mg/20 mL PACK Place 20 mLs (40 mg total) into feeding tube daily. 30 each   . potassium chloride 20 MEQ/15ML (10%) SOLN Place 15 mLs (20 mEq total) into feeding tube daily. 900 mL 0  . sodium chloride 0.9 % injection Inject 3 mLs into the vein every 12 (twelve) hours. 5 mL   . sodium chloride 0.9 % injection Inject 3 mLs into the vein as needed. 5 mL   . tamsulosin (FLOMAX) 0.4 MG CAPS capsule Take 0.4 mg by mouth daily after supper.    . ticagrelor (BRILINTA) 90 MG TABS tablet Place 1 tablet (90 mg total) into feeding tube 2 (two) times daily. 60 tablet   . Water For Irrigation, Sterile (FREE WATER) SOLN Place 250 mLs into feeding tube every 4 (four) hours.    . metoprolol (LOPRESSOR) 50 MG tablet Take 1 tablet (50 mg total) by mouth 2 (two) times daily. 180 tablet 3   No current facility-administered medications for this visit.   Allergies:  Review of patient's allergies indicates no known allergies.   Social History: The patient  reports that he quit smoking about 9 years ago. He has never used smokeless tobacco. He reports that he drinks about 0.6 oz of  alcohol per week.   ROS:  Please see the history of present illness. Otherwise, complete review of systems is very difficult to obtain due to patient's limited communication.  Physical Exam: VS:  BP 124/80 mmHg  Pulse 116  Ht  (1.88 m)  SpO2 97%, BMI There is no weight on file to calculate BMI.  Wt Readings from Last 3 Encounters:  02/08/15 227 lb 4.7 oz (103.1 kg)  03/05/12 287 lb 6.4 oz (130.364 kg)  05/24/10 276 lb (125.193 kg)    General: Patient is in no distress.Transported in on stretcher, has Foley catheter in place. HEENT: Conjunctiva and lids normal, oropharynx clear. Neck: Tracheostomy in place anteriorly, no elevated JVP or carotid bruits, no thyromegaly. Lungs: Diminished  breath sounds without wheezing, nonlabored breathing at rest. Cardiac: Regular with ectopy, no obvious S3, soft systolic murmur, no pericardial rub. Abdomen: Soft, nontender, bowel sounds present, no guarding or rebound. Extremities: No pitting edema, distal pulses 1-2+. Skin: Warm and dry. Musculoskeletal: No kyphosis. Neuropsychiatric: Patient is alert, difficult to assess orientation due to limited communication. He is calm and uses hand signals to communicate answers to yes and no questions.  ECG: Tracing from 01/23/2015 showed sinus rhythm with inferior infarct pattern and nonspecific ST/T-wave changes.  Recent Labwork: 02/26/2015: ALT 16*; AST 15; Magnesium 1.9 03/09/2015: TSH 1.956 03/11/2015: BUN <5*; Creatinine, Ser 0.58*; Hemoglobin 9.2*; Platelets 367; Potassium 3.9; Sodium 138     Component Value Date/Time   CHOL 188 01/24/2015 0430   TRIG 474* 01/24/2015 0430   HDL 29* 01/24/2015 0430   CHOLHDL 6.5 01/24/2015 0430   VLDL UNABLE TO CALCULATE IF TRIGLYCERIDE OVER 400 mg/dL 96/07/5407 8119   LDLCALC UNABLE TO CALCULATE IF TRIGLYCERIDE OVER 400 mg/dL 14/78/2956 2130    Other Studies Reviewed Today:  Cardiac catheterization 01/22/2015:  Mid RCA lesion, 99% stenosed. Post  intervention with aspiration thrombectomy, there is a 0% residual stenosis.  1st RPLB lesion, 100% stenosed. Post intervention, there is a 0% residual stenosis just with wire manipulation.  Prox RCA-1 lesion, 40% stenosed.  Prox RCA-2 lesion, 99% stenosed, which was likely the culprit. I suspect thrombus embolized to the posterior lateral artery and the mid RCA.Marland Kitchen Post intervention with a 4.0 x 32 Synergy drug-eluting stent, postdilated to 4.5 mm, there is a 0% residual stenosis.  The left ventricular systolic function is normal with only basal inferior hypokinesis.  Normal LVEDP.  The left system is free of significant coronary disease.  Echocardiogram 01/25/2015: Study Conclusions  - Left ventricle: The cavity size was moderately dilated. Systolic function was vigorous. The estimated ejection fraction was in the range of 65% to 70%. Wall motion was normal; there were no regional wall motion abnormalities. Doppler parameters are consistent with abnormal left ventricular relaxation (grade 1 diastolic dysfunction). There was no evidence of elevated ventricular filling pressure by Doppler parameters. - Aortic valve: Trileaflet; normal thickness leaflets. There was no regurgitation. - Aortic root: The aortic root was normal in size. - Mitral valve: Structurally normal valve. - Left atrium: The atrium was mildly dilated. - Right ventricle: The cavity size was normal. Wall thickness was normal. Systolic function was normal. - Right atrium: The atrium was normal in size. - Tricuspid valve: There was mild regurgitation. - Pulmonic valve: There was no regurgitation. - Pulmonary arteries: Systolic pressure was within the normal range. - Inferior vena cava: The vessel was dilated. The respirophasic diameter changes were blunted (< 50%), consistent with elevated central venous pressure. - Pericardium, extracardiac: There was no pericardial effusion.  Assessment and  Plan:  1. CAD status post inferolateral STEMI back in October complicated by cardiac arrest. He is now status post DES to the proximal RCA with aspiration thrombectomy/intervention to the mid RCA and PLB. No major left system disease at that time. LVEF also documented in normal range. Plan is to continue current medical therapy including dual antiplatelet therapy. Lopressor will be increased to 50 mg twice daily for now.  2. Essential hypertension by history, blood pressure is normal today.  3. Hyperlipidemia, LDL was not able to be calculated as of October due to high triglycerides, but previous LDL was 91 and 2013. He is on high-dose Lipitor. We will plan to reassess lipids around the time of  his next visit.  4. Type 2 diabetes mellitus, followed by Dr. Olena LeatherwoodHasanaj.  5. Previous history of RVOT-VT, assessed by Dr. Ladona Ridgelaylor in 2012. Continue beta blocker for now. Not entirely clear that this has been a recurrent issue recently.  Current medicines were reviewed with the patient today.   Orders Placed This Encounter  Procedures  . Basic metabolic panel  . EKG 12-Lead    Disposition: FU with me in 6 weeks.   Signed, Jonelle SidleSamuel G. McDowell, MD, [facility] 03/25/2015 10:32 AM    [facility] [address], [place], [place] [address] Phone: [phone]; Fax: [phone]

## 2015-03-25 NOTE — Patient Instructions (Addendum)
Your physician recommends that you schedule a follow-up appointment in: 6 WEEKS WITH DR. [facility]  Your physician has recommended you make the following change in your medication:   INCREASE LOPRESSOR 50 MG TWICE DAILY  Your physician recommends that you return for lab work BMP  [facility]!!

## 2015-04-13 ENCOUNTER — Telehealth: Payer: Self-pay | Admitting: *Deleted

## 2015-04-13 NOTE — Telephone Encounter (Signed)
[facility].  [facility] informed of the below response.

## 2015-04-13 NOTE — Telephone Encounter (Signed)
-----   Message from Jonelle SidleSamuel G McDowell, MD sent at 03/31/2015  8:35 AM EST ----- Reviewed. Potassium and renal function are normal. I do not anticipate any specific changes in cardiac regimen as noted at recent encounter. If he is having that much trouble with fish oil (please the hand written notes on the lab report) then I am OK with stopping it. Please make sure that the nursing facility clarifies that change with the patient's PCP responsible for his care with that facility since we are not supervising his care there.

## 2015-05-12 ENCOUNTER — Encounter: Payer: Self-pay | Admitting: *Deleted

## 2015-05-16 ENCOUNTER — Encounter: Payer: Self-pay | Admitting: Cardiology

## 2015-05-16 ENCOUNTER — Encounter: Payer: Managed Care, Other (non HMO) | Admitting: Cardiology

## 2015-05-16 NOTE — Progress Notes (Signed)
No show  This encounter was created in error - please disregard.

## 2016-07-02 ENCOUNTER — Encounter (HOSPITAL_BASED_OUTPATIENT_CLINIC_OR_DEPARTMENT_OTHER): Payer: Medicaid Other | Attending: Internal Medicine

## 2016-07-02 DIAGNOSIS — Z8674 Personal history of sudden cardiac arrest: Secondary | ICD-10-CM | POA: Diagnosis not present

## 2016-07-02 DIAGNOSIS — L97524 Non-pressure chronic ulcer of other part of left foot with necrosis of bone: Secondary | ICD-10-CM | POA: Diagnosis not present

## 2016-07-02 DIAGNOSIS — B9562 Methicillin resistant Staphylococcus aureus infection as the cause of diseases classified elsewhere: Secondary | ICD-10-CM | POA: Diagnosis not present

## 2016-07-02 DIAGNOSIS — L89313 Pressure ulcer of right buttock, stage 3: Secondary | ICD-10-CM | POA: Insufficient documentation

## 2016-07-02 DIAGNOSIS — Z89431 Acquired absence of right foot: Secondary | ICD-10-CM | POA: Diagnosis not present

## 2016-07-02 DIAGNOSIS — L89153 Pressure ulcer of sacral region, stage 3: Secondary | ICD-10-CM | POA: Insufficient documentation

## 2016-07-02 DIAGNOSIS — G931 Anoxic brain damage, not elsewhere classified: Secondary | ICD-10-CM | POA: Diagnosis not present

## 2016-07-02 DIAGNOSIS — M8639 Chronic multifocal osteomyelitis, multiple sites: Secondary | ICD-10-CM | POA: Diagnosis not present

## 2016-07-02 DIAGNOSIS — I252 Old myocardial infarction: Secondary | ICD-10-CM | POA: Insufficient documentation

## 2016-07-02 DIAGNOSIS — L03032 Cellulitis of left toe: Secondary | ICD-10-CM | POA: Diagnosis not present

## 2016-07-02 DIAGNOSIS — E11621 Type 2 diabetes mellitus with foot ulcer: Secondary | ICD-10-CM | POA: Insufficient documentation

## 2016-07-05 ENCOUNTER — Emergency Department (HOSPITAL_COMMUNITY)
Admission: EM | Admit: 2016-07-05 | Discharge: 2016-07-05 | Disposition: A | Discharge status: Home / Self Care | Payer: Medicaid Other | Attending: Emergency Medicine | Admitting: Emergency Medicine

## 2016-07-05 ENCOUNTER — Encounter (HOSPITAL_COMMUNITY): Payer: Self-pay | Admitting: Emergency Medicine

## 2016-07-05 DIAGNOSIS — E119 Type 2 diabetes mellitus without complications: Secondary | ICD-10-CM | POA: Insufficient documentation

## 2016-07-05 DIAGNOSIS — Z7982 Long term (current) use of aspirin: Secondary | ICD-10-CM | POA: Insufficient documentation

## 2016-07-05 DIAGNOSIS — I2510 Atherosclerotic heart disease of native coronary artery without angina pectoris: Secondary | ICD-10-CM | POA: Diagnosis not present

## 2016-07-05 DIAGNOSIS — R1110 Vomiting, unspecified: Secondary | ICD-10-CM | POA: Diagnosis present

## 2016-07-05 DIAGNOSIS — Z87891 Personal history of nicotine dependence: Secondary | ICD-10-CM | POA: Insufficient documentation

## 2016-07-05 DIAGNOSIS — Z79899 Other long term (current) drug therapy: Secondary | ICD-10-CM | POA: Diagnosis not present

## 2016-07-05 DIAGNOSIS — K9423 Gastrostomy malfunction: Secondary | ICD-10-CM | POA: Insufficient documentation

## 2016-07-05 DIAGNOSIS — I10 Essential (primary) hypertension: Secondary | ICD-10-CM | POA: Diagnosis not present

## 2016-07-05 DIAGNOSIS — Z7984 Long term (current) use of oral hypoglycemic drugs: Secondary | ICD-10-CM | POA: Insufficient documentation

## 2016-07-05 LAB — COMPREHENSIVE METABOLIC PANEL
ALBUMIN: 2.7 g/dL — AB (ref 3.5–5.0)
ALT: 22 U/L (ref 17–63)
AST: 16 U/L (ref 15–41)
Alkaline Phosphatase: 71 U/L (ref 38–126)
Anion gap: 6 (ref 5–15)
BILIRUBIN TOTAL: 0.5 mg/dL (ref 0.3–1.2)
BUN: 20 mg/dL (ref 6–20)
CO2: 30 mmol/L (ref 22–32)
CREATININE: 0.66 mg/dL (ref 0.61–1.24)
Calcium: 8.9 mg/dL (ref 8.9–10.3)
Chloride: 100 mmol/L — ABNORMAL LOW (ref 101–111)
GFR calc Af Amer: >60 mL/min (ref >60)
GLUCOSE: 121 mg/dL — AB (ref 65–99)
Potassium: 4.4 mmol/L (ref 3.5–5.1)
Sodium: 136 mmol/L (ref 135–145)
TOTAL PROTEIN: 6.3 g/dL — AB (ref 6.5–8.1)

## 2016-07-05 LAB — LIPASE, BLOOD: LIPASE: 15 U/L (ref 11–51)

## 2016-07-05 LAB — CBC WITH DIFFERENTIAL/PLATELET
BASOS ABS: 0 10*3/uL (ref 0.0–0.1)
BASOS PCT: 0 %
Eosinophils Absolute: 0.2 10*3/uL (ref 0.0–0.7)
Eosinophils Relative: 2 %
HEMATOCRIT: 31.1 % — AB (ref 39.0–52.0)
HEMOGLOBIN: 10 g/dL — AB (ref 13.0–17.0)
LYMPHS PCT: 21 %
Lymphs Abs: 1.4 10*3/uL (ref 0.7–4.0)
MCH: 28.6 pg (ref 26.0–34.0)
MCHC: 32.2 g/dL (ref 30.0–36.0)
MCV: 88.9 fL (ref 78.0–100.0)
Monocytes Absolute: 0.4 10*3/uL (ref 0.1–1.0)
Monocytes Relative: 6 %
NEUTROS ABS: 4.7 10*3/uL (ref 1.7–7.7)
NEUTROS PCT: 71 %
Platelets: 220 10*3/uL (ref 150–400)
RBC: 3.5 MIL/uL — AB (ref 4.22–5.81)
RDW: 16.6 % — ABNORMAL HIGH (ref 11.5–15.5)
WBC: 6.6 10*3/uL (ref 4.0–10.5)

## 2016-07-05 MED ORDER — ONDANSETRON 8 MG PO TBDP
8.0000 mg | ORAL_TABLET | Freq: Once | ORAL | Status: AC
  Administered 2016-07-05: 8 mg via ORAL
  Filled 2016-07-05: qty 1

## 2016-07-05 MED ORDER — PANCRELIPASE (LIP-PROT-AMYL) 12000-38000 UNITS PO CPEP
24000.0000 [IU] | ORAL_CAPSULE | Freq: Once | ORAL | Status: AC
  Administered 2016-07-05: 24000 [IU] via ORAL
  Filled 2016-07-05: qty 2

## 2016-07-05 MED ORDER — SODIUM BICARBONATE 650 MG PO TABS
650.0000 mg | ORAL_TABLET | Freq: Once | ORAL | Status: AC
  Administered 2016-07-05: 650 mg via ORAL
  Filled 2016-07-05: qty 1

## 2016-07-05 NOTE — ED Notes (Signed)
[facility] given to [place], [facility]

## 2016-07-05 NOTE — ED Provider Notes (Signed)
[facility] DEPT Provider Note   CSN: [id number] Arrival date & time: 07/05/16  1229     History   Chief Complaint Chief Complaint  Patient presents with  . Emesis    HPI Albert Jensen is a 46 y.o. male with a history of DM, HTN, CAD and  is bed bound and nonverbal secondary to anoxic encephalopathy presenting from his nursing home for evaluation of vomiting and a clogged g tube which occurred today.He is able to answer yes and no questions with a thumbs up sign and denies fevers, chest pain, abdominal pain.   The history is provided by the nursing home. The history is limited by the condition of the patient.    Past Medical History:  Diagnosis Date  . Anoxic encephalopathy ([place])   . CAD (coronary artery disease)    DES proximal RCA and aspiration thrombectomy mid RCA and PLB 01/2015  . Cardiac arrest [facility])    October 2016 with STEMI  . Essential hypertension   . Respiratory failure ([place])    Prolonged ventilatory course, complicated by mucus plugging and ultimately required tracheostomy 10-02/2015  . RVOT-VT (right ventricular outflow tract ventricular tachycardia) ([place])    Evaluated by Dr. Lovena Le in 2012  . ST elevation myocardial infarction (STEMI) of inferolateral wall [facility])    October 2016   . Type 2 diabetes mellitus ([place])   . Ventilator associated pneumonia [facility])     Patient Active Problem List   Diagnosis Date Noted  . Acute respiratory failure with hypoxemia ([place])   . Tracheostomy status ([place])   . HCAP (healthcare-associated pneumonia)   . Atelectasis   . Encephalopathy   . Acute renal failure ([place])   . Urinary tract infection, site not specified   . Type II diabetes mellitus, uncontrolled ([place])   . Disorientation   . Pressure ulcer 01/31/2015  . Acute respiratory failure ([place])   . Anoxic encephalopathy ([place]) 01/25/2015  . Acute respiratory failure with hypoxia ([place]) 01/25/2015  . Acute ST elevation myocardial infarction (STEMI) ([place])   . Respiratory  failure ([place])   . Cardiac arrest ([place]) 01/22/2015  . Acute MI, inferolateral wall, initial episode of care ([place])   . ESSENTIAL HYPERTENSION, BENIGN 05/25/2010  . VENTRICULAR TACHYCARDIA, PAROXYSMAL 05/25/2010    Past Surgical History:  Procedure Laterality Date  . CARDIAC CATHETERIZATION N/A 01/22/2015   Procedure: Left Heart Cath and Coronary Angiography;  Surgeon: Jettie Booze, MD;  Location: [place] CV LAB;  Service: Cardiovascular;  Laterality: N/A;  . CARDIAC CATHETERIZATION N/A 01/22/2015   Procedure: Coronary Stent Intervention;  Surgeon: Jettie Booze, MD;  Location: [place] CV LAB;  Service: Cardiovascular;  Laterality: N/A;  prox rca       Home Medications    Prior to Admission medications   Medication Sig Start Date End Date Taking? Authorizing Provider  acidophilus (RISAQUAD) CAPS capsule Take 1 capsule by mouth daily.   Yes Historical Provider, MD  Amino Acids-Protein Hydrolys (FEEDING SUPPLEMENT, PRO-STAT SUGAR FREE 64,) LIQD Take 30 mLs by mouth daily.   Yes Historical Provider, MD  cholecalciferol (VITAMIN D) 1000 units tablet Take 2,000 Units by mouth daily.   Yes Historical Provider, MD  clopidogrel (PLAVIX) 75 MG tablet 75 mg daily.   Yes Historical Provider, MD  docusate (COLACE) 60 MG/15ML syrup Take 10 mg by mouth daily.   Yes Historical Provider, MD  fentaNYL (DURAGESIC - DOSED MCG/HR) 25 MCG/HR patch Place 25 mcg onto the skin every 3 (three) days.  Yes Historical Provider, MD  HYDROcodone-acetaminophen (NORCO) 10-325 MG tablet Take 1 tablet by mouth every 8 (eight) hours as needed.   Yes Historical Provider, MD  ibuprofen (ADVIL,MOTRIN) 600 MG tablet Take 600 mg by mouth every 8 (eight) hours as needed.   Yes Historical Provider, MD  Melatonin 3 MG TABS 1 tablet at bedtime.   Yes Historical Provider, MD  Nutritional Supplements (FEEDING SUPPLEMENT, GLUCERNA 1.2 CAL,) LIQD Place 80 mL/hr into feeding tube continuous. 02/08/15  Yes Donita Brooks, NP  sennosides (SENOKOT) 8.8 MG/5ML syrup Take 10 mLs by mouth 2 (two) times daily.   Yes Historical Provider, MD  sertraline (ZOLOFT) 20 MG/ML concentrated solution 2.5 mLs daily. 06/12/16 06/12/17 Yes Historical Provider, MD  aspirin 81 MG chewable tablet Place 1 tablet (81 mg total) into feeding tube daily. 02/08/15   Donita Brooks, NP  atorvastatin (LIPITOR) 80 MG tablet Place 1 tablet (80 mg total) into feeding tube daily at 6 PM. 02/08/15   Donita Brooks, NP  Dextrose-Sodium Chloride (DEXTROSE 5 % AND 0.45% NACL) infusion Inject 40 mL/hr into the vein continuous. 02/08/15   Donita Brooks, NP  metFORMIN (GLUCOPHAGE) 500 MG tablet Take 1 tablet by mouth 2 (two) times daily.    Historical Provider, MD  mirtazapine (REMERON) 15 MG tablet 1 tablet at bedtime.    Historical Provider, MD  Multiple Vitamins-Minerals (THERA-M) TABS 1 tablet daily.    Historical Provider, MD  simethicone (MYLICON) 80 MG chewable tablet 1 tablet every 8 (eight) hours as needed.    Historical Provider, MD  terazosin (HYTRIN) 1 MG capsule 1 capsule at bedtime.    Historical Provider, MD  vitamin C (ASCORBIC ACID) 500 MG tablet 1 tablet 2 (two) times daily.    Historical Provider, MD  zinc sulfate 220 (50 Zn) MG capsule 1 capsule daily.    Historical Provider, MD    Family History Family History  Problem Relation Age of Onset  . Heart disease Mother 78    MI  . Diabetes Father     Social History Social History  Substance Use Topics  . Smoking status: Former Smoker    Quit date: 05/01/2005  . Smokeless tobacco: Never Used  . Alcohol use 0.6 oz/week    1 Cans of beer per week     Allergies   Patient has no known allergies.   Review of Systems Review of Systems  Unable to perform ROS: Patient nonverbal     Physical Exam Updated Vital Signs BP 95/69   Pulse 82   Temp 99 F (37.2 C)   Resp 18   SpO2 90%   Physical Exam  Constitutional: He appears well-nourished.  HENT:  Head:  Normocephalic and atraumatic.  Eyes: Conjunctivae are normal.  Cardiovascular: Normal rate, regular rhythm, normal heart sounds and intact distal pulses.   Pulmonary/Chest: Effort normal and breath sounds normal. He has no wheezes.  Abdominal: Soft. Bowel sounds are normal. He exhibits no distension and no mass. There is no tenderness. There is no guarding.  g tube and colostomy in place.  No leakage.   Neurological: He is alert.  Skin: Skin is warm and dry.  Psychiatric: He has a normal mood and affect.  Nursing note and vitals reviewed.    ED Treatments / Results  Labs (all labs ordered are listed, but only abnormal results are displayed) Labs Reviewed  CBC WITH DIFFERENTIAL/PLATELET - Abnormal; Notable for the following:       Result  Value   RBC 3.50 (*)    Hemoglobin 10.0 (*)    HCT 31.1 (*)    RDW 16.6 (*)    All other components within normal limits  COMPREHENSIVE METABOLIC PANEL - Abnormal; Notable for the following:    Chloride 100 (*)    Glucose, Bld 121 (*)    Total Protein 6.3 (*)    Albumin 2.7 (*)    All other components within normal limits  LIPASE, BLOOD    Labs giving no indication for source of vomiting.  Pt had no emesis while in the ed.  EKG  EKG Interpretation None       Radiology No results found.  Procedures Procedures (including critical care time)  G tube was flushed with warm water, but resistence was met.  Next RN used creon capsules mixed with warm water and sodium bicarb.  Clamped tube for 15 minutes after which the tube drained and flushed freely.  Pt had no emesis while in dept.    Medications Ordered in ED Medications  ondansetron (ZOFRAN-ODT) disintegrating tablet 8 mg (8 mg Oral Given 07/05/16 1454)  lipase/protease/amylase (CREON) capsule 24,000 Units (24,000 Units Oral Given 07/05/16 1603)    And  sodium bicarbonate tablet 650 mg (650 mg Oral Given 07/05/16 1603)     Initial Impression / Assessment and Plan / ED Course  I  have reviewed the triage vital signs and the nursing notes.  Pertinent labs & imaging results that were available during my care of the patient were reviewed by me and considered in my medical decision making (see chart for details).     Prn f/u anticipated.  Labs reviewed. abd soft, no acute abd findings. Prn f/u anticipated.  Final Clinical Impressions(s) / ED Diagnoses   Final diagnoses:  Gastrostomy tube dysfunction [facility])    New Prescriptions Discharge Medication List as of 07/05/2016  4:38 PM       Evalee Jefferson, PA-C 07/07/16 1352    Nat Christen, MD 07/08/16 [phone]

## 2016-07-05 NOTE — ED Notes (Signed)
Call[facility]

## 2016-07-05 NOTE — ED Triage Notes (Signed)
Per EMS[facility] and clotted PEG tube. CBg 135 per EMS. Pt is nonverbal at baseline. nad noted.

## 2016-07-05 NOTE — ED Notes (Signed)
No change with attempting to clear G tube with carbonated beverage or warm water.

## 2016-07-13 ENCOUNTER — Other Ambulatory Visit (HOSPITAL_COMMUNITY): Payer: Self-pay | Admitting: Internal Medicine

## 2016-07-13 DIAGNOSIS — M869 Osteomyelitis, unspecified: Secondary | ICD-10-CM

## 2016-07-19 ENCOUNTER — Encounter (HOSPITAL_BASED_OUTPATIENT_CLINIC_OR_DEPARTMENT_OTHER): Payer: Medicaid Other | Attending: Internal Medicine

## 2016-07-19 DIAGNOSIS — E1140 Type 2 diabetes mellitus with diabetic neuropathy, unspecified: Secondary | ICD-10-CM | POA: Insufficient documentation

## 2016-07-19 DIAGNOSIS — L97524 Non-pressure chronic ulcer of other part of left foot with necrosis of bone: Secondary | ICD-10-CM | POA: Insufficient documentation

## 2016-07-19 DIAGNOSIS — M8639 Chronic multifocal osteomyelitis, multiple sites: Secondary | ICD-10-CM | POA: Insufficient documentation

## 2016-07-19 DIAGNOSIS — L89153 Pressure ulcer of sacral region, stage 3: Secondary | ICD-10-CM | POA: Insufficient documentation

## 2016-07-19 DIAGNOSIS — L89313 Pressure ulcer of right buttock, stage 3: Secondary | ICD-10-CM | POA: Insufficient documentation

## 2016-07-19 DIAGNOSIS — B9562 Methicillin resistant Staphylococcus aureus infection as the cause of diseases classified elsewhere: Secondary | ICD-10-CM | POA: Diagnosis not present

## 2016-07-19 DIAGNOSIS — I2510 Atherosclerotic heart disease of native coronary artery without angina pectoris: Secondary | ICD-10-CM | POA: Diagnosis not present

## 2016-07-19 DIAGNOSIS — I10 Essential (primary) hypertension: Secondary | ICD-10-CM | POA: Insufficient documentation

## 2016-07-19 DIAGNOSIS — E11621 Type 2 diabetes mellitus with foot ulcer: Secondary | ICD-10-CM | POA: Diagnosis not present

## 2016-07-19 DIAGNOSIS — I252 Old myocardial infarction: Secondary | ICD-10-CM | POA: Diagnosis not present

## 2016-07-19 DIAGNOSIS — Z89431 Acquired absence of right foot: Secondary | ICD-10-CM | POA: Insufficient documentation

## 2016-07-20 ENCOUNTER — Ambulatory Visit (HOSPITAL_COMMUNITY)
Admission: RE | Admit: 2016-07-20 | Discharge: 2016-07-20 | Disposition: A | Discharge status: Home / Self Care | Payer: Medicaid Other | Source: Ambulatory Visit | Attending: Internal Medicine | Admitting: Internal Medicine

## 2016-07-20 ENCOUNTER — Other Ambulatory Visit (HOSPITAL_COMMUNITY): Payer: Self-pay | Admitting: Internal Medicine

## 2016-07-20 DIAGNOSIS — M868X7 Other osteomyelitis, ankle and foot: Secondary | ICD-10-CM | POA: Insufficient documentation

## 2016-07-20 DIAGNOSIS — M869 Osteomyelitis, unspecified: Secondary | ICD-10-CM

## 2016-08-02 DIAGNOSIS — L89153 Pressure ulcer of sacral region, stage 3: Secondary | ICD-10-CM | POA: Diagnosis not present

## 2016-08-16 ENCOUNTER — Encounter (HOSPITAL_BASED_OUTPATIENT_CLINIC_OR_DEPARTMENT_OTHER): Payer: Medicaid Other | Attending: Internal Medicine

## 2016-08-16 DIAGNOSIS — E11621 Type 2 diabetes mellitus with foot ulcer: Secondary | ICD-10-CM | POA: Insufficient documentation

## 2016-08-16 DIAGNOSIS — I509 Heart failure, unspecified: Secondary | ICD-10-CM | POA: Insufficient documentation

## 2016-08-16 DIAGNOSIS — I2510 Atherosclerotic heart disease of native coronary artery without angina pectoris: Secondary | ICD-10-CM | POA: Insufficient documentation

## 2016-08-16 DIAGNOSIS — L97524 Non-pressure chronic ulcer of other part of left foot with necrosis of bone: Secondary | ICD-10-CM | POA: Insufficient documentation

## 2016-08-16 DIAGNOSIS — L89153 Pressure ulcer of sacral region, stage 3: Secondary | ICD-10-CM | POA: Insufficient documentation

## 2016-08-16 DIAGNOSIS — D649 Anemia, unspecified: Secondary | ICD-10-CM | POA: Insufficient documentation

## 2016-08-16 DIAGNOSIS — E1140 Type 2 diabetes mellitus with diabetic neuropathy, unspecified: Secondary | ICD-10-CM | POA: Insufficient documentation

## 2016-08-16 DIAGNOSIS — M8639 Chronic multifocal osteomyelitis, multiple sites: Secondary | ICD-10-CM | POA: Insufficient documentation

## 2016-08-16 DIAGNOSIS — I110 Hypertensive heart disease with heart failure: Secondary | ICD-10-CM | POA: Insufficient documentation

## 2016-08-16 DIAGNOSIS — I252 Old myocardial infarction: Secondary | ICD-10-CM | POA: Insufficient documentation

## 2016-08-16 DIAGNOSIS — L89313 Pressure ulcer of right buttock, stage 3: Secondary | ICD-10-CM | POA: Insufficient documentation

## 2016-08-20 ENCOUNTER — Other Ambulatory Visit (HOSPITAL_COMMUNITY)
Admission: RE | Admit: 2016-08-20 | Discharge: 2016-08-20 | Disposition: A | Discharge status: Home / Self Care | Payer: Medicaid Other | Source: Other Acute Inpatient Hospital | Attending: Pediatrics | Admitting: Pediatrics

## 2016-08-20 DIAGNOSIS — M8639 Chronic multifocal osteomyelitis, multiple sites: Secondary | ICD-10-CM | POA: Diagnosis not present

## 2016-08-20 DIAGNOSIS — I509 Heart failure, unspecified: Secondary | ICD-10-CM | POA: Diagnosis not present

## 2016-08-20 DIAGNOSIS — I252 Old myocardial infarction: Secondary | ICD-10-CM | POA: Diagnosis not present

## 2016-08-20 DIAGNOSIS — D649 Anemia, unspecified: Secondary | ICD-10-CM | POA: Diagnosis not present

## 2016-08-20 DIAGNOSIS — L89313 Pressure ulcer of right buttock, stage 3: Secondary | ICD-10-CM | POA: Diagnosis not present

## 2016-08-20 DIAGNOSIS — L97524 Non-pressure chronic ulcer of other part of left foot with necrosis of bone: Secondary | ICD-10-CM | POA: Insufficient documentation

## 2016-08-20 DIAGNOSIS — E1140 Type 2 diabetes mellitus with diabetic neuropathy, unspecified: Secondary | ICD-10-CM | POA: Diagnosis not present

## 2016-08-20 DIAGNOSIS — E11621 Type 2 diabetes mellitus with foot ulcer: Secondary | ICD-10-CM | POA: Diagnosis not present

## 2016-08-20 DIAGNOSIS — I2510 Atherosclerotic heart disease of native coronary artery without angina pectoris: Secondary | ICD-10-CM | POA: Diagnosis not present

## 2016-08-20 DIAGNOSIS — I110 Hypertensive heart disease with heart failure: Secondary | ICD-10-CM | POA: Diagnosis not present

## 2016-08-20 DIAGNOSIS — L89153 Pressure ulcer of sacral region, stage 3: Secondary | ICD-10-CM | POA: Diagnosis not present

## 2016-08-25 LAB — AEROBIC/ANAEROBIC CULTURE W GRAM STAIN (SURGICAL/DEEP WOUND)

## 2016-08-25 LAB — AEROBIC/ANAEROBIC CULTURE (SURGICAL/DEEP WOUND): CULTURE: NO GROWTH

## 2016-09-06 DIAGNOSIS — E11621 Type 2 diabetes mellitus with foot ulcer: Secondary | ICD-10-CM | POA: Diagnosis not present

## 2016-09-20 ENCOUNTER — Encounter (HOSPITAL_BASED_OUTPATIENT_CLINIC_OR_DEPARTMENT_OTHER): Payer: Medicaid Other | Attending: Internal Medicine

## 2016-09-20 ENCOUNTER — Other Ambulatory Visit (HOSPITAL_COMMUNITY)
Admission: RE | Admit: 2016-09-20 | Discharge: 2016-09-20 | Disposition: A | Discharge status: Home / Self Care | Payer: Medicaid Other | Source: Other Acute Inpatient Hospital | Attending: Internal Medicine | Admitting: Internal Medicine

## 2016-09-20 ENCOUNTER — Other Ambulatory Visit: Payer: Self-pay | Admitting: Internal Medicine

## 2016-09-20 DIAGNOSIS — I252 Old myocardial infarction: Secondary | ICD-10-CM | POA: Insufficient documentation

## 2016-09-20 DIAGNOSIS — L89893 Pressure ulcer of other site, stage 3: Secondary | ICD-10-CM | POA: Diagnosis not present

## 2016-09-20 DIAGNOSIS — Z8631 Personal history of diabetic foot ulcer: Secondary | ICD-10-CM | POA: Insufficient documentation

## 2016-09-20 DIAGNOSIS — I10 Essential (primary) hypertension: Secondary | ICD-10-CM | POA: Diagnosis not present

## 2016-09-20 DIAGNOSIS — L89314 Pressure ulcer of right buttock, stage 4: Secondary | ICD-10-CM | POA: Diagnosis present

## 2016-09-20 DIAGNOSIS — E1140 Type 2 diabetes mellitus with diabetic neuropathy, unspecified: Secondary | ICD-10-CM | POA: Insufficient documentation

## 2016-09-20 DIAGNOSIS — I2510 Atherosclerotic heart disease of native coronary artery without angina pectoris: Secondary | ICD-10-CM | POA: Insufficient documentation

## 2016-09-20 DIAGNOSIS — L89612 Pressure ulcer of right heel, stage 2: Secondary | ICD-10-CM | POA: Diagnosis not present

## 2016-09-20 DIAGNOSIS — Z89411 Acquired absence of right great toe: Secondary | ICD-10-CM | POA: Diagnosis not present

## 2016-09-23 LAB — AEROBIC CULTURE W GRAM STAIN (SUPERFICIAL SPECIMEN)

## 2016-09-23 LAB — AEROBIC CULTURE  (SUPERFICIAL SPECIMEN)

## 2016-10-04 DIAGNOSIS — L89314 Pressure ulcer of right buttock, stage 4: Secondary | ICD-10-CM | POA: Diagnosis not present

## 2016-10-18 ENCOUNTER — Encounter (HOSPITAL_BASED_OUTPATIENT_CLINIC_OR_DEPARTMENT_OTHER): Payer: Medicaid Other | Attending: Internal Medicine

## 2016-10-18 DIAGNOSIS — M8639 Chronic multifocal osteomyelitis, multiple sites: Secondary | ICD-10-CM | POA: Insufficient documentation

## 2016-10-18 DIAGNOSIS — B9562 Methicillin resistant Staphylococcus aureus infection as the cause of diseases classified elsewhere: Secondary | ICD-10-CM | POA: Diagnosis not present

## 2016-10-18 DIAGNOSIS — I2510 Atherosclerotic heart disease of native coronary artery without angina pectoris: Secondary | ICD-10-CM | POA: Diagnosis not present

## 2016-10-18 DIAGNOSIS — L89314 Pressure ulcer of right buttock, stage 4: Secondary | ICD-10-CM | POA: Diagnosis present

## 2016-10-18 DIAGNOSIS — I10 Essential (primary) hypertension: Secondary | ICD-10-CM | POA: Diagnosis not present

## 2016-10-18 DIAGNOSIS — L89893 Pressure ulcer of other site, stage 3: Secondary | ICD-10-CM | POA: Insufficient documentation

## 2016-10-18 DIAGNOSIS — I252 Old myocardial infarction: Secondary | ICD-10-CM | POA: Diagnosis not present

## 2016-10-18 DIAGNOSIS — E1140 Type 2 diabetes mellitus with diabetic neuropathy, unspecified: Secondary | ICD-10-CM | POA: Diagnosis not present

## 2016-10-25 ENCOUNTER — Other Ambulatory Visit (HOSPITAL_COMMUNITY): Payer: Self-pay | Admitting: Internal Medicine

## 2016-10-25 DIAGNOSIS — M869 Osteomyelitis, unspecified: Secondary | ICD-10-CM

## 2016-10-30 ENCOUNTER — Ambulatory Visit (HOSPITAL_COMMUNITY)
Admission: RE | Admit: 2016-10-30 | Discharge: 2016-10-30 | Disposition: A | Discharge status: Home / Self Care | Payer: Medicaid Other | Source: Ambulatory Visit | Attending: Internal Medicine | Admitting: Internal Medicine

## 2016-10-30 DIAGNOSIS — L98491 Non-pressure chronic ulcer of skin of other sites limited to breakdown of skin: Secondary | ICD-10-CM | POA: Insufficient documentation

## 2016-10-30 DIAGNOSIS — R600 Localized edema: Secondary | ICD-10-CM | POA: Diagnosis not present

## 2016-10-30 DIAGNOSIS — M869 Osteomyelitis, unspecified: Secondary | ICD-10-CM

## 2016-11-01 ENCOUNTER — Ambulatory Visit (HOSPITAL_COMMUNITY)
Admission: RE | Admit: 2016-11-01 | Discharge: 2016-11-01 | Disposition: A | Discharge status: Home / Self Care | Payer: Medicaid Other | Source: Ambulatory Visit | Attending: Internal Medicine | Admitting: Internal Medicine

## 2016-11-03 ENCOUNTER — Other Ambulatory Visit
Admission: RE | Admit: 2016-11-03 | Discharge: 2016-11-03 | Disposition: A | Discharge status: Home / Self Care | Payer: Medicaid Other | Source: Ambulatory Visit | Attending: *Deleted | Admitting: *Deleted

## 2016-11-03 DIAGNOSIS — M869 Osteomyelitis, unspecified: Secondary | ICD-10-CM | POA: Insufficient documentation

## 2016-11-03 DIAGNOSIS — Z79899 Other long term (current) drug therapy: Secondary | ICD-10-CM | POA: Insufficient documentation

## 2016-11-03 LAB — BASIC METABOLIC PANEL
ANION GAP: 8 (ref 5–15)
BUN: 16 mg/dL (ref 6–20)
CALCIUM: 8.8 mg/dL — AB (ref 8.9–10.3)
CO2: 26 mmol/L (ref 22–32)
Chloride: 104 mmol/L (ref 101–111)
Creatinine, Ser: 0.71 mg/dL (ref 0.61–1.24)
GFR calc Af Amer: >60 mL/min (ref >60)
GLUCOSE: 144 mg/dL — AB (ref 65–99)
POTASSIUM: 4 mmol/L (ref 3.5–5.1)
SODIUM: 138 mmol/L (ref 135–145)

## 2016-11-03 LAB — VANCOMYCIN, TROUGH: VANCOMYCIN TR: 11 ug/mL — AB (ref 15–20)

## 2016-11-08 ENCOUNTER — Encounter (HOSPITAL_BASED_OUTPATIENT_CLINIC_OR_DEPARTMENT_OTHER): Payer: Medicaid Other | Attending: Internal Medicine

## 2016-11-08 DIAGNOSIS — L89512 Pressure ulcer of right ankle, stage 2: Secondary | ICD-10-CM | POA: Diagnosis not present

## 2016-11-08 DIAGNOSIS — S91101A Unspecified open wound of right great toe without damage to nail, initial encounter: Secondary | ICD-10-CM | POA: Insufficient documentation

## 2016-11-08 DIAGNOSIS — X58XXXA Exposure to other specified factors, initial encounter: Secondary | ICD-10-CM | POA: Insufficient documentation

## 2016-11-08 DIAGNOSIS — I509 Heart failure, unspecified: Secondary | ICD-10-CM | POA: Insufficient documentation

## 2016-11-08 DIAGNOSIS — I110 Hypertensive heart disease with heart failure: Secondary | ICD-10-CM | POA: Insufficient documentation

## 2016-11-08 DIAGNOSIS — M86351 Chronic multifocal osteomyelitis, right femur: Secondary | ICD-10-CM | POA: Diagnosis not present

## 2016-11-08 DIAGNOSIS — L89893 Pressure ulcer of other site, stage 3: Secondary | ICD-10-CM | POA: Diagnosis not present

## 2016-11-08 DIAGNOSIS — B9562 Methicillin resistant Staphylococcus aureus infection as the cause of diseases classified elsewhere: Secondary | ICD-10-CM | POA: Insufficient documentation

## 2016-11-08 DIAGNOSIS — E1140 Type 2 diabetes mellitus with diabetic neuropathy, unspecified: Secondary | ICD-10-CM | POA: Insufficient documentation

## 2016-11-08 DIAGNOSIS — L89314 Pressure ulcer of right buttock, stage 4: Secondary | ICD-10-CM | POA: Insufficient documentation

## 2016-11-08 DIAGNOSIS — I2510 Atherosclerotic heart disease of native coronary artery without angina pectoris: Secondary | ICD-10-CM | POA: Insufficient documentation

## 2016-11-08 DIAGNOSIS — S91104A Unspecified open wound of right lesser toe(s) without damage to nail, initial encounter: Secondary | ICD-10-CM | POA: Diagnosis not present

## 2016-11-29 DIAGNOSIS — L89512 Pressure ulcer of right ankle, stage 2: Secondary | ICD-10-CM | POA: Diagnosis not present

## 2016-12-27 ENCOUNTER — Encounter (HOSPITAL_BASED_OUTPATIENT_CLINIC_OR_DEPARTMENT_OTHER): Payer: Medicaid Other | Attending: Internal Medicine

## 2016-12-27 DIAGNOSIS — L97522 Non-pressure chronic ulcer of other part of left foot with fat layer exposed: Secondary | ICD-10-CM | POA: Insufficient documentation

## 2016-12-27 DIAGNOSIS — I2510 Atherosclerotic heart disease of native coronary artery without angina pectoris: Secondary | ICD-10-CM | POA: Diagnosis not present

## 2016-12-27 DIAGNOSIS — L89153 Pressure ulcer of sacral region, stage 3: Secondary | ICD-10-CM | POA: Insufficient documentation

## 2016-12-27 DIAGNOSIS — L89323 Pressure ulcer of left buttock, stage 3: Secondary | ICD-10-CM | POA: Diagnosis not present

## 2016-12-27 DIAGNOSIS — E1140 Type 2 diabetes mellitus with diabetic neuropathy, unspecified: Secondary | ICD-10-CM | POA: Insufficient documentation

## 2016-12-27 DIAGNOSIS — L97519 Non-pressure chronic ulcer of other part of right foot with unspecified severity: Secondary | ICD-10-CM | POA: Diagnosis not present

## 2016-12-27 DIAGNOSIS — L89314 Pressure ulcer of right buttock, stage 4: Secondary | ICD-10-CM | POA: Diagnosis not present

## 2016-12-27 DIAGNOSIS — I252 Old myocardial infarction: Secondary | ICD-10-CM | POA: Diagnosis not present

## 2016-12-27 DIAGNOSIS — I10 Essential (primary) hypertension: Secondary | ICD-10-CM | POA: Diagnosis not present

## 2016-12-31 ENCOUNTER — Other Ambulatory Visit: Payer: Self-pay | Admitting: Internal Medicine

## 2017-01-01 ENCOUNTER — Other Ambulatory Visit: Payer: Self-pay | Admitting: Internal Medicine

## 2017-01-01 DIAGNOSIS — L97524 Non-pressure chronic ulcer of other part of left foot with necrosis of bone: Secondary | ICD-10-CM

## 2017-01-10 ENCOUNTER — Ambulatory Visit (HOSPITAL_COMMUNITY)
Admission: RE | Admit: 2017-01-10 | Discharge: 2017-01-10 | Disposition: A | Discharge status: Home / Self Care | Payer: Medicaid Other | Source: Ambulatory Visit | Attending: Internal Medicine | Admitting: Internal Medicine

## 2017-01-10 DIAGNOSIS — L97524 Non-pressure chronic ulcer of other part of left foot with necrosis of bone: Secondary | ICD-10-CM | POA: Diagnosis not present

## 2017-01-24 ENCOUNTER — Encounter (HOSPITAL_BASED_OUTPATIENT_CLINIC_OR_DEPARTMENT_OTHER): Payer: Medicaid Other | Attending: Internal Medicine

## 2017-01-24 DIAGNOSIS — I10 Essential (primary) hypertension: Secondary | ICD-10-CM | POA: Insufficient documentation

## 2017-01-24 DIAGNOSIS — L97519 Non-pressure chronic ulcer of other part of right foot with unspecified severity: Secondary | ICD-10-CM | POA: Insufficient documentation

## 2017-01-24 DIAGNOSIS — L89612 Pressure ulcer of right heel, stage 2: Secondary | ICD-10-CM | POA: Diagnosis not present

## 2017-01-24 DIAGNOSIS — I252 Old myocardial infarction: Secondary | ICD-10-CM | POA: Insufficient documentation

## 2017-01-24 DIAGNOSIS — I2510 Atherosclerotic heart disease of native coronary artery without angina pectoris: Secondary | ICD-10-CM | POA: Diagnosis not present

## 2017-01-24 DIAGNOSIS — E1140 Type 2 diabetes mellitus with diabetic neuropathy, unspecified: Secondary | ICD-10-CM | POA: Diagnosis not present

## 2017-01-24 DIAGNOSIS — L97522 Non-pressure chronic ulcer of other part of left foot with fat layer exposed: Secondary | ICD-10-CM | POA: Insufficient documentation

## 2017-01-24 DIAGNOSIS — E11621 Type 2 diabetes mellitus with foot ulcer: Secondary | ICD-10-CM | POA: Diagnosis not present

## 2017-01-24 DIAGNOSIS — L89153 Pressure ulcer of sacral region, stage 3: Secondary | ICD-10-CM | POA: Diagnosis present

## 2017-02-21 ENCOUNTER — Encounter (HOSPITAL_BASED_OUTPATIENT_CLINIC_OR_DEPARTMENT_OTHER): Payer: Medicaid Other | Attending: Internal Medicine

## 2017-02-21 ENCOUNTER — Other Ambulatory Visit (HOSPITAL_COMMUNITY)
Admission: RE | Admit: 2017-02-21 | Discharge: 2017-02-21 | Disposition: A | Discharge status: Home / Self Care | Payer: Medicaid Other | Source: Other Acute Inpatient Hospital | Attending: Internal Medicine | Admitting: Internal Medicine

## 2017-02-21 DIAGNOSIS — L97812 Non-pressure chronic ulcer of other part of right lower leg with fat layer exposed: Secondary | ICD-10-CM | POA: Insufficient documentation

## 2017-02-21 DIAGNOSIS — I2510 Atherosclerotic heart disease of native coronary artery without angina pectoris: Secondary | ICD-10-CM | POA: Insufficient documentation

## 2017-02-21 DIAGNOSIS — L97522 Non-pressure chronic ulcer of other part of left foot with fat layer exposed: Secondary | ICD-10-CM | POA: Diagnosis not present

## 2017-02-21 DIAGNOSIS — I10 Essential (primary) hypertension: Secondary | ICD-10-CM | POA: Insufficient documentation

## 2017-02-21 DIAGNOSIS — E1140 Type 2 diabetes mellitus with diabetic neuropathy, unspecified: Secondary | ICD-10-CM | POA: Diagnosis not present

## 2017-02-21 DIAGNOSIS — Z8614 Personal history of Methicillin resistant Staphylococcus aureus infection: Secondary | ICD-10-CM | POA: Insufficient documentation

## 2017-02-21 DIAGNOSIS — I252 Old myocardial infarction: Secondary | ICD-10-CM | POA: Diagnosis not present

## 2017-02-21 DIAGNOSIS — Z89422 Acquired absence of other left toe(s): Secondary | ICD-10-CM | POA: Diagnosis not present

## 2017-02-21 DIAGNOSIS — L89153 Pressure ulcer of sacral region, stage 3: Secondary | ICD-10-CM | POA: Insufficient documentation

## 2017-02-24 LAB — AEROBIC CULTURE W GRAM STAIN (SUPERFICIAL SPECIMEN): Culture: NO GROWTH

## 2017-02-24 LAB — AEROBIC CULTURE  (SUPERFICIAL SPECIMEN)

## 2017-03-14 ENCOUNTER — Encounter (HOSPITAL_BASED_OUTPATIENT_CLINIC_OR_DEPARTMENT_OTHER): Payer: Medicaid Other | Attending: Internal Medicine

## 2017-03-14 DIAGNOSIS — L89153 Pressure ulcer of sacral region, stage 3: Secondary | ICD-10-CM | POA: Insufficient documentation

## 2017-03-14 DIAGNOSIS — I509 Heart failure, unspecified: Secondary | ICD-10-CM | POA: Insufficient documentation

## 2017-03-14 DIAGNOSIS — I2510 Atherosclerotic heart disease of native coronary artery without angina pectoris: Secondary | ICD-10-CM | POA: Insufficient documentation

## 2017-03-14 DIAGNOSIS — E1169 Type 2 diabetes mellitus with other specified complication: Secondary | ICD-10-CM | POA: Insufficient documentation

## 2017-03-14 DIAGNOSIS — S81802A Unspecified open wound, left lower leg, initial encounter: Secondary | ICD-10-CM | POA: Insufficient documentation

## 2017-03-14 DIAGNOSIS — M8639 Chronic multifocal osteomyelitis, multiple sites: Secondary | ICD-10-CM | POA: Insufficient documentation

## 2017-03-14 DIAGNOSIS — L97524 Non-pressure chronic ulcer of other part of left foot with necrosis of bone: Secondary | ICD-10-CM | POA: Insufficient documentation

## 2017-03-14 DIAGNOSIS — E11621 Type 2 diabetes mellitus with foot ulcer: Secondary | ICD-10-CM | POA: Insufficient documentation

## 2017-03-14 DIAGNOSIS — E1140 Type 2 diabetes mellitus with diabetic neuropathy, unspecified: Secondary | ICD-10-CM | POA: Insufficient documentation

## 2017-03-14 DIAGNOSIS — I110 Hypertensive heart disease with heart failure: Secondary | ICD-10-CM | POA: Insufficient documentation

## 2017-03-14 DIAGNOSIS — I252 Old myocardial infarction: Secondary | ICD-10-CM | POA: Insufficient documentation

## 2017-03-14 DIAGNOSIS — X58XXXA Exposure to other specified factors, initial encounter: Secondary | ICD-10-CM | POA: Insufficient documentation

## 2017-03-14 DIAGNOSIS — L89313 Pressure ulcer of right buttock, stage 3: Secondary | ICD-10-CM | POA: Insufficient documentation

## 2017-03-28 DIAGNOSIS — E1169 Type 2 diabetes mellitus with other specified complication: Secondary | ICD-10-CM | POA: Diagnosis not present

## 2017-03-28 DIAGNOSIS — L97524 Non-pressure chronic ulcer of other part of left foot with necrosis of bone: Secondary | ICD-10-CM | POA: Diagnosis not present

## 2017-03-28 DIAGNOSIS — L89313 Pressure ulcer of right buttock, stage 3: Secondary | ICD-10-CM | POA: Diagnosis not present

## 2017-03-28 DIAGNOSIS — E11621 Type 2 diabetes mellitus with foot ulcer: Secondary | ICD-10-CM | POA: Diagnosis present

## 2017-03-28 DIAGNOSIS — I2510 Atherosclerotic heart disease of native coronary artery without angina pectoris: Secondary | ICD-10-CM | POA: Diagnosis not present

## 2017-03-28 DIAGNOSIS — I252 Old myocardial infarction: Secondary | ICD-10-CM | POA: Diagnosis not present

## 2017-03-28 DIAGNOSIS — E1140 Type 2 diabetes mellitus with diabetic neuropathy, unspecified: Secondary | ICD-10-CM | POA: Diagnosis not present

## 2017-03-28 DIAGNOSIS — I509 Heart failure, unspecified: Secondary | ICD-10-CM | POA: Diagnosis not present

## 2017-03-28 DIAGNOSIS — S81802A Unspecified open wound, left lower leg, initial encounter: Secondary | ICD-10-CM | POA: Diagnosis not present

## 2017-03-28 DIAGNOSIS — I110 Hypertensive heart disease with heart failure: Secondary | ICD-10-CM | POA: Diagnosis not present

## 2017-03-28 DIAGNOSIS — M8639 Chronic multifocal osteomyelitis, multiple sites: Secondary | ICD-10-CM | POA: Diagnosis not present

## 2017-03-28 DIAGNOSIS — L89153 Pressure ulcer of sacral region, stage 3: Secondary | ICD-10-CM | POA: Diagnosis not present

## 2017-03-28 DIAGNOSIS — X58XXXA Exposure to other specified factors, initial encounter: Secondary | ICD-10-CM | POA: Diagnosis not present

## 2017-04-25 ENCOUNTER — Encounter (HOSPITAL_BASED_OUTPATIENT_CLINIC_OR_DEPARTMENT_OTHER): Payer: Medicaid Other | Attending: Internal Medicine

## 2017-04-25 DIAGNOSIS — I2510 Atherosclerotic heart disease of native coronary artery without angina pectoris: Secondary | ICD-10-CM | POA: Insufficient documentation

## 2017-04-25 DIAGNOSIS — E1140 Type 2 diabetes mellitus with diabetic neuropathy, unspecified: Secondary | ICD-10-CM | POA: Insufficient documentation

## 2017-04-25 DIAGNOSIS — I252 Old myocardial infarction: Secondary | ICD-10-CM | POA: Diagnosis not present

## 2017-04-25 DIAGNOSIS — I10 Essential (primary) hypertension: Secondary | ICD-10-CM | POA: Diagnosis not present

## 2017-04-25 DIAGNOSIS — L89153 Pressure ulcer of sacral region, stage 3: Secondary | ICD-10-CM | POA: Diagnosis present

## 2017-04-25 DIAGNOSIS — L97522 Non-pressure chronic ulcer of other part of left foot with fat layer exposed: Secondary | ICD-10-CM | POA: Diagnosis not present

## 2017-05-23 ENCOUNTER — Other Ambulatory Visit (HOSPITAL_COMMUNITY)
Admission: RE | Admit: 2017-05-23 | Discharge: 2017-05-23 | Disposition: A | Discharge status: Home / Self Care | Payer: Medicaid Other | Source: Other Acute Inpatient Hospital | Attending: Internal Medicine | Admitting: Internal Medicine

## 2017-05-23 ENCOUNTER — Encounter (HOSPITAL_BASED_OUTPATIENT_CLINIC_OR_DEPARTMENT_OTHER): Payer: Medicaid Other | Attending: Internal Medicine

## 2017-05-23 DIAGNOSIS — I2510 Atherosclerotic heart disease of native coronary artery without angina pectoris: Secondary | ICD-10-CM | POA: Insufficient documentation

## 2017-05-23 DIAGNOSIS — I509 Heart failure, unspecified: Secondary | ICD-10-CM | POA: Insufficient documentation

## 2017-05-23 DIAGNOSIS — L97522 Non-pressure chronic ulcer of other part of left foot with fat layer exposed: Secondary | ICD-10-CM | POA: Insufficient documentation

## 2017-05-23 DIAGNOSIS — I110 Hypertensive heart disease with heart failure: Secondary | ICD-10-CM | POA: Diagnosis not present

## 2017-05-23 DIAGNOSIS — E11621 Type 2 diabetes mellitus with foot ulcer: Secondary | ICD-10-CM | POA: Insufficient documentation

## 2017-05-23 DIAGNOSIS — E1140 Type 2 diabetes mellitus with diabetic neuropathy, unspecified: Secondary | ICD-10-CM | POA: Diagnosis not present

## 2017-05-23 DIAGNOSIS — M8639 Chronic multifocal osteomyelitis, multiple sites: Secondary | ICD-10-CM | POA: Insufficient documentation

## 2017-05-23 DIAGNOSIS — L89153 Pressure ulcer of sacral region, stage 3: Secondary | ICD-10-CM | POA: Insufficient documentation

## 2017-05-23 DIAGNOSIS — I252 Old myocardial infarction: Secondary | ICD-10-CM | POA: Insufficient documentation

## 2017-05-23 DIAGNOSIS — E1169 Type 2 diabetes mellitus with other specified complication: Secondary | ICD-10-CM | POA: Insufficient documentation

## 2017-05-24 DIAGNOSIS — L97529 Non-pressure chronic ulcer of other part of left foot with unspecified severity: Secondary | ICD-10-CM | POA: Diagnosis not present

## 2017-05-26 LAB — AEROBIC CULTURE W GRAM STAIN (SUPERFICIAL SPECIMEN)

## 2017-05-26 LAB — AEROBIC CULTURE  (SUPERFICIAL SPECIMEN)

## 2017-05-31 ENCOUNTER — Other Ambulatory Visit (HOSPITAL_COMMUNITY): Payer: Self-pay | Admitting: Internal Medicine

## 2017-05-31 DIAGNOSIS — L03039 Cellulitis of unspecified toe: Secondary | ICD-10-CM

## 2017-06-05 DIAGNOSIS — M25551 Pain in right hip: Secondary | ICD-10-CM | POA: Diagnosis not present

## 2017-06-07 DIAGNOSIS — L8995 Pressure ulcer of unspecified site, unstageable: Secondary | ICD-10-CM | POA: Diagnosis not present

## 2017-06-08 DIAGNOSIS — E782 Mixed hyperlipidemia: Secondary | ICD-10-CM | POA: Diagnosis not present

## 2017-06-08 DIAGNOSIS — E7800 Pure hypercholesterolemia, unspecified: Secondary | ICD-10-CM | POA: Diagnosis not present

## 2017-06-08 DIAGNOSIS — Z79899 Other long term (current) drug therapy: Secondary | ICD-10-CM | POA: Diagnosis not present

## 2017-06-08 DIAGNOSIS — D649 Anemia, unspecified: Secondary | ICD-10-CM | POA: Diagnosis not present

## 2017-06-18 DIAGNOSIS — R110 Nausea: Secondary | ICD-10-CM | POA: Diagnosis not present

## 2017-06-18 DIAGNOSIS — G894 Chronic pain syndrome: Secondary | ICD-10-CM | POA: Diagnosis not present

## 2017-06-18 DIAGNOSIS — L03116 Cellulitis of left lower limb: Secondary | ICD-10-CM | POA: Diagnosis not present

## 2017-06-18 DIAGNOSIS — S91302D Unspecified open wound, left foot, subsequent encounter: Secondary | ICD-10-CM | POA: Diagnosis not present

## 2017-06-20 ENCOUNTER — Encounter (HOSPITAL_BASED_OUTPATIENT_CLINIC_OR_DEPARTMENT_OTHER): Payer: Medicare Other | Attending: Internal Medicine

## 2017-06-20 DIAGNOSIS — I2510 Atherosclerotic heart disease of native coronary artery without angina pectoris: Secondary | ICD-10-CM | POA: Insufficient documentation

## 2017-06-20 DIAGNOSIS — S91302D Unspecified open wound, left foot, subsequent encounter: Secondary | ICD-10-CM | POA: Diagnosis not present

## 2017-06-20 DIAGNOSIS — L89613 Pressure ulcer of right heel, stage 3: Secondary | ICD-10-CM | POA: Insufficient documentation

## 2017-06-20 DIAGNOSIS — I110 Hypertensive heart disease with heart failure: Secondary | ICD-10-CM | POA: Diagnosis not present

## 2017-06-20 DIAGNOSIS — L89153 Pressure ulcer of sacral region, stage 3: Secondary | ICD-10-CM | POA: Insufficient documentation

## 2017-06-20 DIAGNOSIS — L89313 Pressure ulcer of right buttock, stage 3: Secondary | ICD-10-CM | POA: Insufficient documentation

## 2017-06-20 DIAGNOSIS — G894 Chronic pain syndrome: Secondary | ICD-10-CM | POA: Diagnosis not present

## 2017-06-20 DIAGNOSIS — E1140 Type 2 diabetes mellitus with diabetic neuropathy, unspecified: Secondary | ICD-10-CM | POA: Insufficient documentation

## 2017-06-20 DIAGNOSIS — I509 Heart failure, unspecified: Secondary | ICD-10-CM | POA: Diagnosis not present

## 2017-06-20 DIAGNOSIS — E11621 Type 2 diabetes mellitus with foot ulcer: Secondary | ICD-10-CM | POA: Insufficient documentation

## 2017-06-20 DIAGNOSIS — L97522 Non-pressure chronic ulcer of other part of left foot with fat layer exposed: Secondary | ICD-10-CM | POA: Insufficient documentation

## 2017-06-20 DIAGNOSIS — R110 Nausea: Secondary | ICD-10-CM | POA: Diagnosis not present

## 2017-06-20 DIAGNOSIS — I252 Old myocardial infarction: Secondary | ICD-10-CM | POA: Diagnosis not present

## 2017-06-20 DIAGNOSIS — L03116 Cellulitis of left lower limb: Secondary | ICD-10-CM | POA: Diagnosis not present

## 2017-06-20 DIAGNOSIS — S91302A Unspecified open wound, left foot, initial encounter: Secondary | ICD-10-CM | POA: Diagnosis not present

## 2017-07-08 DIAGNOSIS — N390 Urinary tract infection, site not specified: Secondary | ICD-10-CM | POA: Diagnosis not present

## 2017-07-18 ENCOUNTER — Other Ambulatory Visit (HOSPITAL_BASED_OUTPATIENT_CLINIC_OR_DEPARTMENT_OTHER): Payer: Self-pay | Admitting: Internal Medicine

## 2017-07-18 ENCOUNTER — Other Ambulatory Visit (HOSPITAL_COMMUNITY)
Admission: RE | Admit: 2017-07-18 | Discharge: 2017-07-18 | Disposition: A | Discharge status: Home / Self Care | Payer: Medicare Other | Source: Other Acute Inpatient Hospital | Attending: Internal Medicine | Admitting: Internal Medicine

## 2017-07-18 ENCOUNTER — Encounter (HOSPITAL_BASED_OUTPATIENT_CLINIC_OR_DEPARTMENT_OTHER): Payer: Medicare Other | Attending: Internal Medicine

## 2017-07-18 DIAGNOSIS — I252 Old myocardial infarction: Secondary | ICD-10-CM | POA: Diagnosis not present

## 2017-07-18 DIAGNOSIS — E11621 Type 2 diabetes mellitus with foot ulcer: Secondary | ICD-10-CM | POA: Insufficient documentation

## 2017-07-18 DIAGNOSIS — L97522 Non-pressure chronic ulcer of other part of left foot with fat layer exposed: Secondary | ICD-10-CM | POA: Diagnosis not present

## 2017-07-18 DIAGNOSIS — I509 Heart failure, unspecified: Secondary | ICD-10-CM | POA: Insufficient documentation

## 2017-07-18 DIAGNOSIS — E1140 Type 2 diabetes mellitus with diabetic neuropathy, unspecified: Secondary | ICD-10-CM | POA: Diagnosis not present

## 2017-07-18 DIAGNOSIS — I2510 Atherosclerotic heart disease of native coronary artery without angina pectoris: Secondary | ICD-10-CM | POA: Diagnosis not present

## 2017-07-18 DIAGNOSIS — B9562 Methicillin resistant Staphylococcus aureus infection as the cause of diseases classified elsewhere: Secondary | ICD-10-CM | POA: Diagnosis not present

## 2017-07-18 DIAGNOSIS — L89153 Pressure ulcer of sacral region, stage 3: Secondary | ICD-10-CM | POA: Insufficient documentation

## 2017-07-18 DIAGNOSIS — I110 Hypertensive heart disease with heart failure: Secondary | ICD-10-CM | POA: Insufficient documentation

## 2017-07-18 DIAGNOSIS — N319 Neuromuscular dysfunction of bladder, unspecified: Secondary | ICD-10-CM | POA: Diagnosis not present

## 2017-07-18 DIAGNOSIS — I6389 Other cerebral infarction: Secondary | ICD-10-CM | POA: Diagnosis not present

## 2017-07-18 DIAGNOSIS — S91302A Unspecified open wound, left foot, initial encounter: Secondary | ICD-10-CM | POA: Diagnosis not present

## 2017-07-18 DIAGNOSIS — M868X7 Other osteomyelitis, ankle and foot: Secondary | ICD-10-CM | POA: Diagnosis not present

## 2017-07-18 DIAGNOSIS — Z960 Presence of urogenital implants: Secondary | ICD-10-CM | POA: Diagnosis not present

## 2017-07-18 DIAGNOSIS — S91302D Unspecified open wound, left foot, subsequent encounter: Secondary | ICD-10-CM | POA: Diagnosis not present

## 2017-07-21 IMAGING — CT CT CERVICAL SPINE W/O CM
3 of 8 series · 10 of 33 positions shown, 12 images · non-contrast
Comparison: None.

CLINICAL DATA: Code STEMI, status post fall, hit back of head,
intubated

EXAM:
CT HEAD WITHOUT CONTRAST
CT CERVICAL SPINE WITHOUT CONTRAST
TECHNIQUE: Multidetector CT imaging of the head and cervical spine was
performed following the standard protocol without intravenous
contrast. Multiplanar CT image reconstructions of the cervical spine
were also generated.

[Series 305: coronal lower · coronal · 0.36mm/px · 3 of 47 slices shown]
[im 6/47  bone]
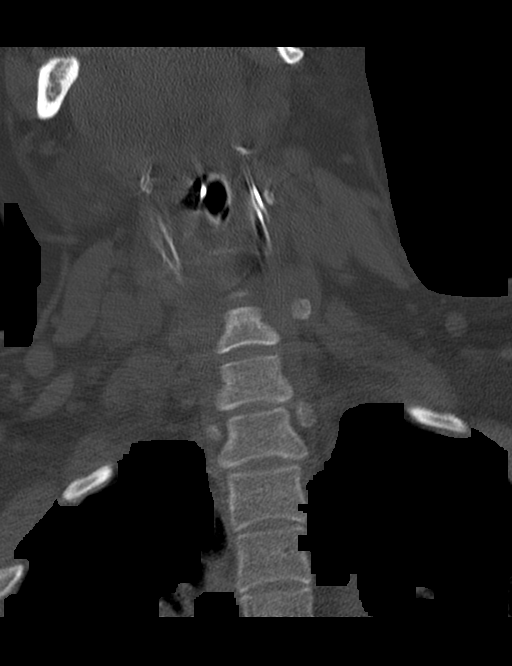
[im 19/47  bone]
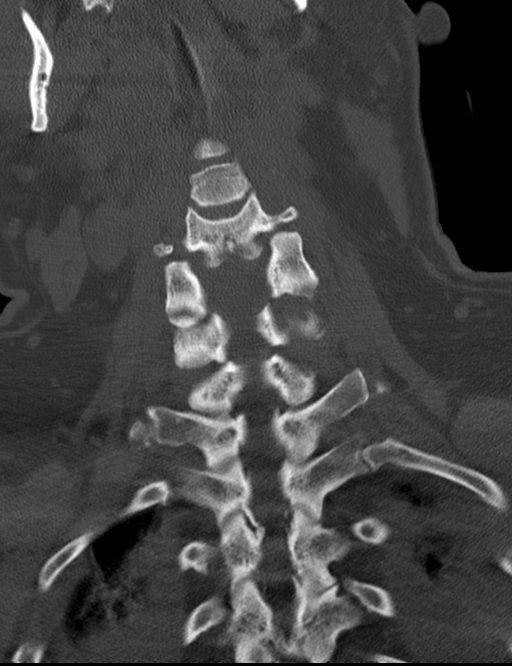
[im 33/47  bone]
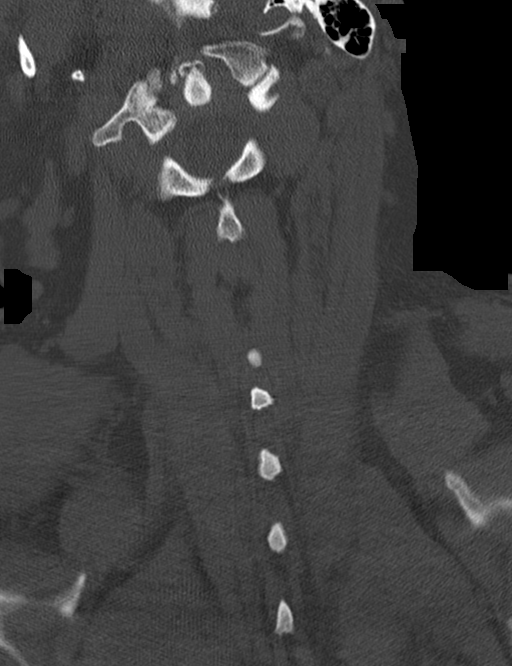

[Series 306: sagittal · sagittal · 0.36mm/px · 5 of 50 slices shown, 6 images]
[im 17/50  bone]
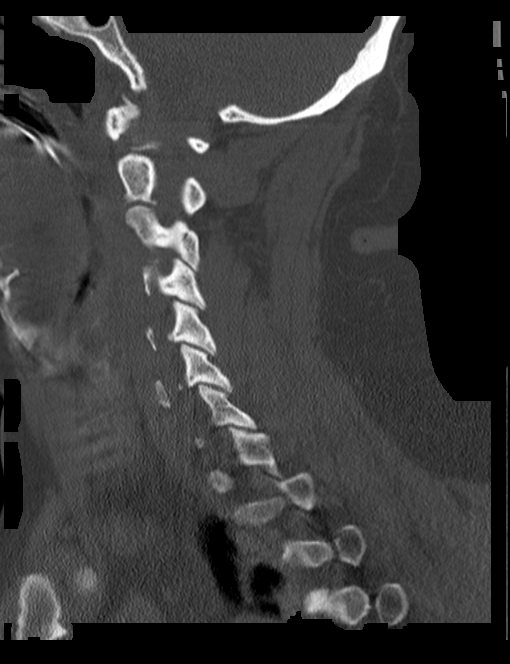
[im 21/50  bone]
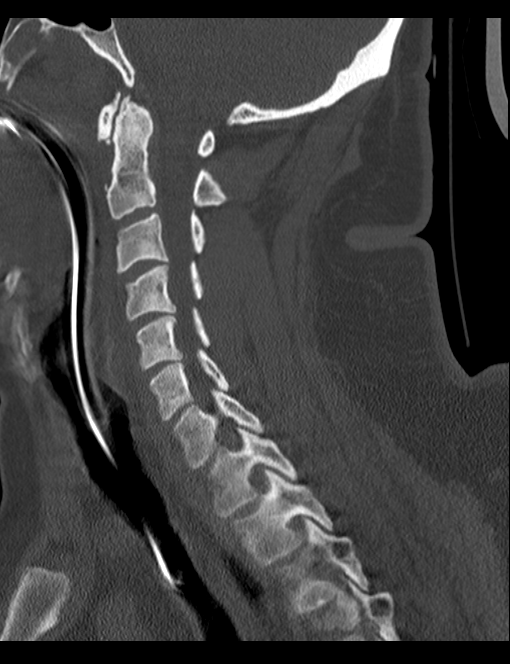
[im 25/50  soft-tissue]
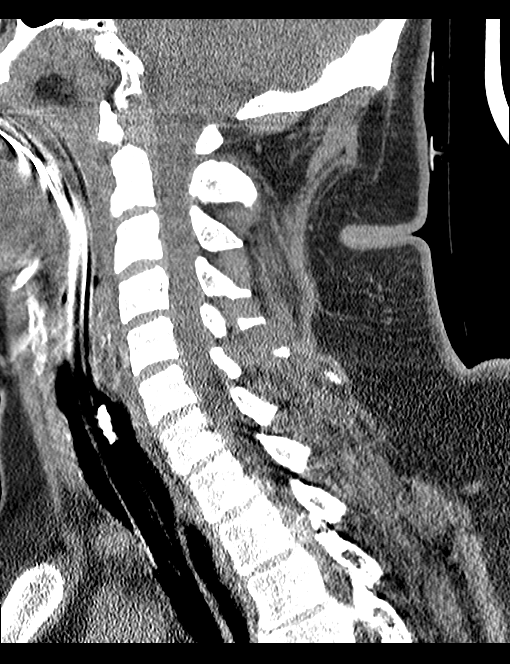
[im 25/50  bone]
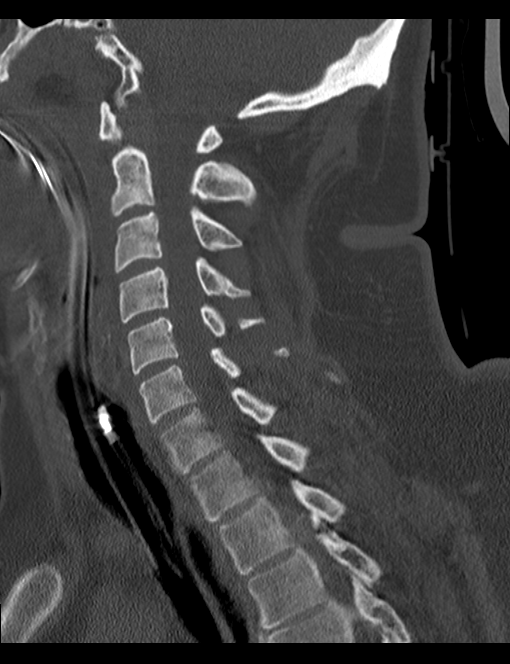
[im 29/50  bone]
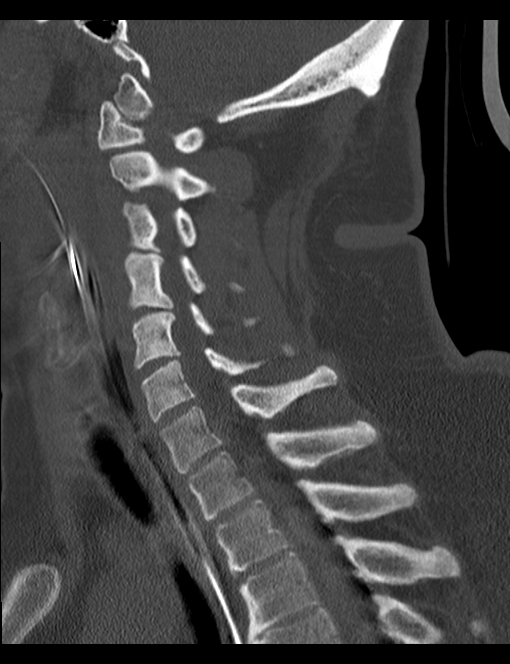
[im 33/50  bone]
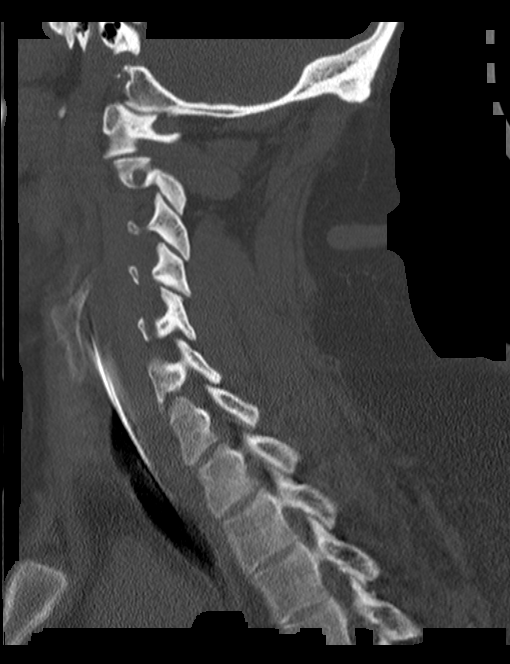

[Series 307: orthogonal · axial · 0.36mm/px · z∈[+80,+189]mm · 2 of 57 slices shown, 3 images]
[im 1/57  soft-tissue]
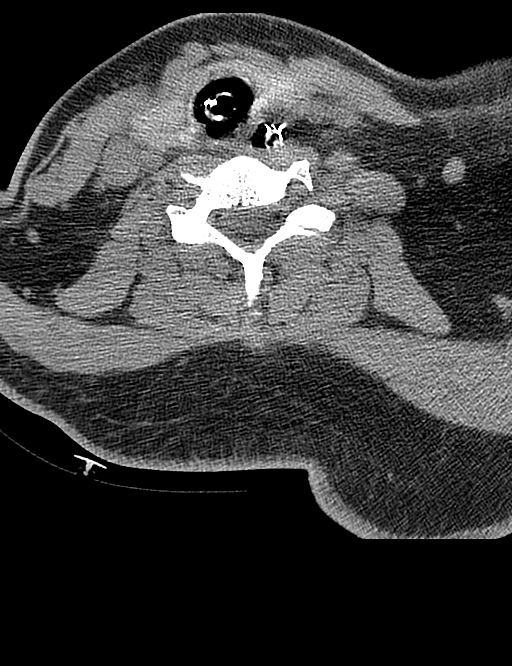
[im 1/57  bone]
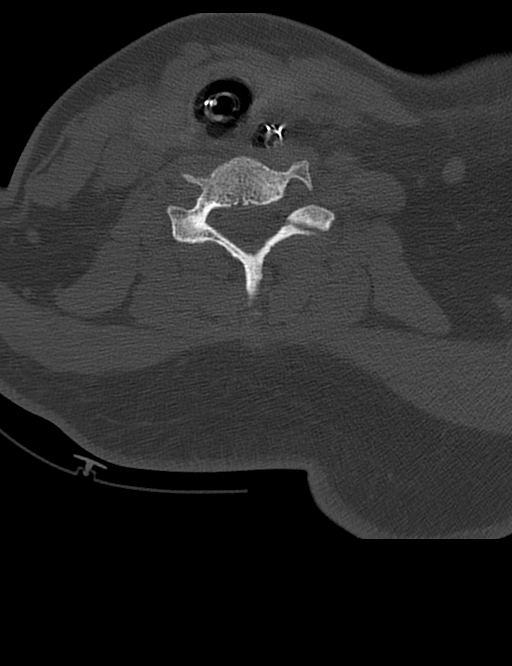
[im 57/57  bone]
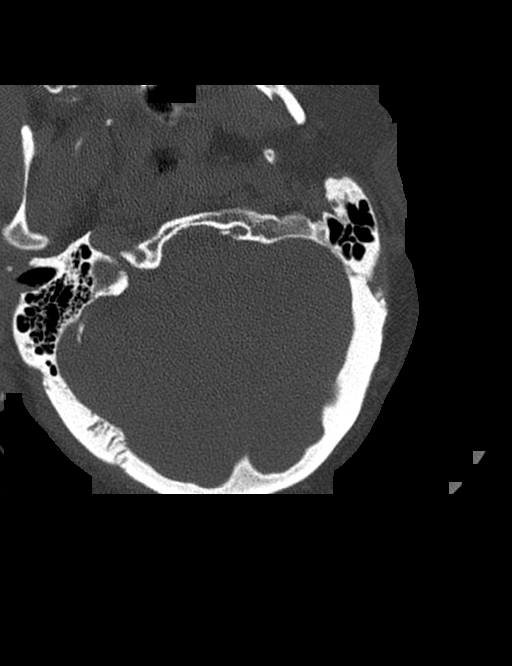

[10 of 33 positions shown; findings below may reference images not displayed]

FINDINGS: CT HEAD FINDINGS

No evidence of parenchymal hemorrhage or extra-axial fluid
collection. No mass lesion, mass effect, or midline shift.

No CT evidence of acute infarction.

Cerebral volume is within normal limits.  No ventriculomegaly.

Near complete opacification of the right sphenoid sinus. The mastoid
air cells are unopacified.

No evidence of calvarial fracture.

CT CERVICAL SPINE FINDINGS

Normal cervical lordosis.

No evidence of fracture or dislocation. Vertebral body heights and
intervertebral disc spaces are maintained.

Dens appears intact.

No prevertebral soft tissue swelling.
IMPRESSION: Normal head CT.

Normal cervical spine CT.

These results were called by telephone at the time of interpretation
on 01/22/2015 at [DATE] to Dr. Masoya, who verbally acknowledged
these results.

## 2017-07-22 IMAGING — CR DG CHEST 1V PORT
1 series · 1 of 1 positions shown · non-contrast
Comparison: Earlier today at 1311 hours.

CLINICAL DATA: Encounter for central line placement. Hypertension
diabetes.

EXAM:
PORTABLE CHEST 1 VIEW

[AP]
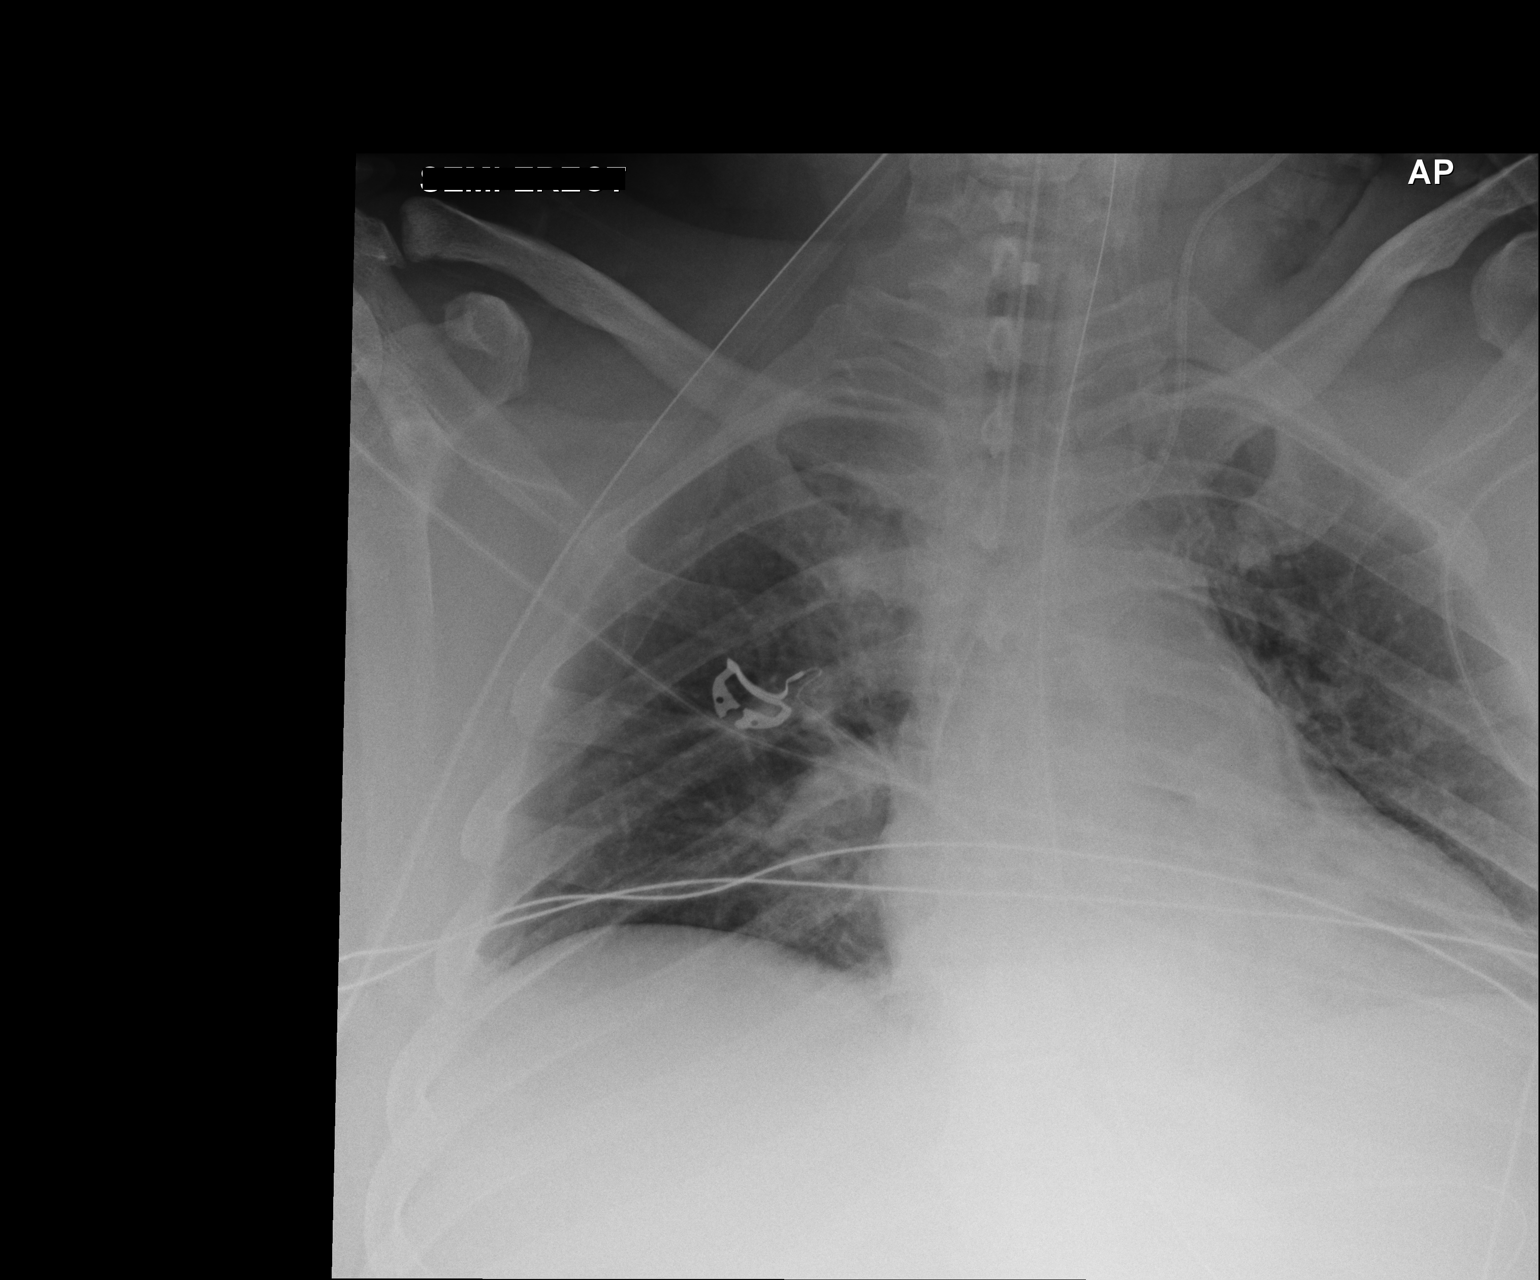

[1 of 1 positions shown; findings below may reference images not displayed]

FINDINGS: 0193 hours. Endotracheal tube terminates 2.9 cm above carina.
Nasogastric extends beyond the inferior aspect of the film. Left
internal jugular line tip at low SVC.

Mildly degraded exam due to AP portable technique and patient body
habitus. Normal heart size for level of inspiration. Left
costophrenic angle minimally excluded. No pleural effusion or
pneumothorax. No congestive failure. Low lung volumes with resultant
pulmonary interstitial prominence.
IMPRESSION: Left internal jugular line appropriately positioned, without
pneumothorax.

Low lung volumes, without acute disease.

## 2017-07-23 DIAGNOSIS — B9562 Methicillin resistant Staphylococcus aureus infection as the cause of diseases classified elsewhere: Secondary | ICD-10-CM | POA: Diagnosis not present

## 2017-07-23 DIAGNOSIS — T83098D Other mechanical complication of other indwelling urethral catheter, subsequent encounter: Secondary | ICD-10-CM | POA: Diagnosis not present

## 2017-07-23 DIAGNOSIS — L03116 Cellulitis of left lower limb: Secondary | ICD-10-CM | POA: Diagnosis not present

## 2017-07-23 DIAGNOSIS — S91302D Unspecified open wound, left foot, subsequent encounter: Secondary | ICD-10-CM | POA: Diagnosis not present

## 2017-07-23 DIAGNOSIS — Z452 Encounter for adjustment and management of vascular access device: Secondary | ICD-10-CM | POA: Diagnosis not present

## 2017-07-23 LAB — AEROBIC/ANAEROBIC CULTURE (SURGICAL/DEEP WOUND)

## 2017-07-23 LAB — AEROBIC/ANAEROBIC CULTURE W GRAM STAIN (SURGICAL/DEEP WOUND)

## 2017-07-26 DIAGNOSIS — Z79899 Other long term (current) drug therapy: Secondary | ICD-10-CM | POA: Diagnosis not present

## 2017-07-29 DIAGNOSIS — Z79899 Other long term (current) drug therapy: Secondary | ICD-10-CM | POA: Diagnosis not present

## 2017-08-02 DIAGNOSIS — Z79899 Other long term (current) drug therapy: Secondary | ICD-10-CM | POA: Diagnosis not present

## 2017-08-02 DIAGNOSIS — L89153 Pressure ulcer of sacral region, stage 3: Secondary | ICD-10-CM | POA: Diagnosis not present

## 2017-08-02 DIAGNOSIS — I252 Old myocardial infarction: Secondary | ICD-10-CM | POA: Diagnosis not present

## 2017-08-02 DIAGNOSIS — I2510 Atherosclerotic heart disease of native coronary artery without angina pectoris: Secondary | ICD-10-CM | POA: Diagnosis not present

## 2017-08-02 DIAGNOSIS — I110 Hypertensive heart disease with heart failure: Secondary | ICD-10-CM | POA: Diagnosis not present

## 2017-08-02 DIAGNOSIS — L97522 Non-pressure chronic ulcer of other part of left foot with fat layer exposed: Secondary | ICD-10-CM | POA: Diagnosis not present

## 2017-08-02 DIAGNOSIS — B9562 Methicillin resistant Staphylococcus aureus infection as the cause of diseases classified elsewhere: Secondary | ICD-10-CM | POA: Diagnosis not present

## 2017-08-03 DIAGNOSIS — Z79899 Other long term (current) drug therapy: Secondary | ICD-10-CM | POA: Diagnosis not present

## 2017-08-06 DIAGNOSIS — T83098D Other mechanical complication of other indwelling urethral catheter, subsequent encounter: Secondary | ICD-10-CM | POA: Diagnosis not present

## 2017-08-06 DIAGNOSIS — N319 Neuromuscular dysfunction of bladder, unspecified: Secondary | ICD-10-CM | POA: Diagnosis not present

## 2017-08-06 DIAGNOSIS — R21 Rash and other nonspecific skin eruption: Secondary | ICD-10-CM | POA: Diagnosis not present

## 2017-08-06 DIAGNOSIS — B9562 Methicillin resistant Staphylococcus aureus infection as the cause of diseases classified elsewhere: Secondary | ICD-10-CM | POA: Diagnosis not present

## 2017-08-09 DIAGNOSIS — Z79899 Other long term (current) drug therapy: Secondary | ICD-10-CM | POA: Diagnosis not present

## 2017-08-13 DIAGNOSIS — Z79899 Other long term (current) drug therapy: Secondary | ICD-10-CM | POA: Diagnosis not present

## 2017-08-13 DIAGNOSIS — R338 Other retention of urine: Secondary | ICD-10-CM | POA: Diagnosis not present

## 2017-08-16 ENCOUNTER — Encounter (HOSPITAL_BASED_OUTPATIENT_CLINIC_OR_DEPARTMENT_OTHER): Payer: Medicaid Other

## 2017-08-16 DIAGNOSIS — L89159 Pressure ulcer of sacral region, unspecified stage: Secondary | ICD-10-CM | POA: Diagnosis not present

## 2017-08-16 DIAGNOSIS — G894 Chronic pain syndrome: Secondary | ICD-10-CM | POA: Diagnosis not present

## 2017-08-16 DIAGNOSIS — S91302D Unspecified open wound, left foot, subsequent encounter: Secondary | ICD-10-CM | POA: Diagnosis not present

## 2017-08-16 DIAGNOSIS — R110 Nausea: Secondary | ICD-10-CM | POA: Diagnosis not present

## 2017-08-16 DIAGNOSIS — Z79899 Other long term (current) drug therapy: Secondary | ICD-10-CM | POA: Diagnosis not present

## 2017-08-19 DIAGNOSIS — Z79899 Other long term (current) drug therapy: Secondary | ICD-10-CM | POA: Diagnosis not present

## 2017-08-23 DIAGNOSIS — Z79899 Other long term (current) drug therapy: Secondary | ICD-10-CM | POA: Diagnosis not present

## 2017-08-26 ENCOUNTER — Other Ambulatory Visit: Payer: Self-pay | Admitting: Urology

## 2017-08-26 DIAGNOSIS — R339 Retention of urine, unspecified: Secondary | ICD-10-CM

## 2017-08-26 DIAGNOSIS — Z79899 Other long term (current) drug therapy: Secondary | ICD-10-CM | POA: Diagnosis not present

## 2017-08-30 DIAGNOSIS — Z79899 Other long term (current) drug therapy: Secondary | ICD-10-CM | POA: Diagnosis not present

## 2017-08-31 ENCOUNTER — Other Ambulatory Visit
Admission: RE | Admit: 2017-08-31 | Discharge: 2017-08-31 | Disposition: A | Discharge status: Home / Self Care | Payer: Medicare Other | Source: Ambulatory Visit | Attending: Internal Medicine | Admitting: Internal Medicine

## 2017-08-31 DIAGNOSIS — Z79899 Other long term (current) drug therapy: Secondary | ICD-10-CM | POA: Insufficient documentation

## 2017-08-31 LAB — VANCOMYCIN, TROUGH: VANCOMYCIN TR: 19 ug/mL (ref 15–20)

## 2017-09-03 DIAGNOSIS — G894 Chronic pain syndrome: Secondary | ICD-10-CM | POA: Diagnosis not present

## 2017-09-03 DIAGNOSIS — M86179 Other acute osteomyelitis, unspecified ankle and foot: Secondary | ICD-10-CM | POA: Diagnosis not present

## 2017-09-03 DIAGNOSIS — T83098D Other mechanical complication of other indwelling urethral catheter, subsequent encounter: Secondary | ICD-10-CM | POA: Diagnosis not present

## 2017-09-03 DIAGNOSIS — Z933 Colostomy status: Secondary | ICD-10-CM | POA: Diagnosis not present

## 2017-09-10 ENCOUNTER — Encounter (HOSPITAL_BASED_OUTPATIENT_CLINIC_OR_DEPARTMENT_OTHER): Payer: Medicare Other | Attending: Internal Medicine

## 2017-09-10 DIAGNOSIS — E11621 Type 2 diabetes mellitus with foot ulcer: Secondary | ICD-10-CM | POA: Insufficient documentation

## 2017-09-10 DIAGNOSIS — I509 Heart failure, unspecified: Secondary | ICD-10-CM | POA: Diagnosis not present

## 2017-09-10 DIAGNOSIS — L97526 Non-pressure chronic ulcer of other part of left foot with bone involvement without evidence of necrosis: Secondary | ICD-10-CM | POA: Insufficient documentation

## 2017-09-10 DIAGNOSIS — E1140 Type 2 diabetes mellitus with diabetic neuropathy, unspecified: Secondary | ICD-10-CM | POA: Insufficient documentation

## 2017-09-10 DIAGNOSIS — I2510 Atherosclerotic heart disease of native coronary artery without angina pectoris: Secondary | ICD-10-CM | POA: Insufficient documentation

## 2017-09-10 DIAGNOSIS — Z8614 Personal history of Methicillin resistant Staphylococcus aureus infection: Secondary | ICD-10-CM | POA: Insufficient documentation

## 2017-09-10 DIAGNOSIS — I110 Hypertensive heart disease with heart failure: Secondary | ICD-10-CM | POA: Diagnosis not present

## 2017-09-10 DIAGNOSIS — I252 Old myocardial infarction: Secondary | ICD-10-CM | POA: Diagnosis not present

## 2017-09-10 DIAGNOSIS — S91302A Unspecified open wound, left foot, initial encounter: Secondary | ICD-10-CM | POA: Diagnosis not present

## 2017-09-10 DIAGNOSIS — R279 Unspecified lack of coordination: Secondary | ICD-10-CM | POA: Diagnosis not present

## 2017-09-10 DIAGNOSIS — Z743 Need for continuous supervision: Secondary | ICD-10-CM | POA: Diagnosis not present

## 2017-09-11 DIAGNOSIS — I469 Cardiac arrest, cause unspecified: Secondary | ICD-10-CM | POA: Diagnosis not present

## 2017-09-23 ENCOUNTER — Other Ambulatory Visit: Payer: Self-pay | Admitting: Radiology

## 2017-09-25 ENCOUNTER — Encounter (HOSPITAL_COMMUNITY): Payer: Self-pay

## 2017-09-25 ENCOUNTER — Ambulatory Visit (HOSPITAL_COMMUNITY)
Admission: RE | Admit: 2017-09-25 | Discharge: 2017-09-25 | Disposition: A | Discharge status: Home / Self Care | Payer: Medicare Other | Source: Ambulatory Visit | Attending: Urology | Admitting: Urology

## 2017-09-25 DIAGNOSIS — I10 Essential (primary) hypertension: Secondary | ICD-10-CM | POA: Insufficient documentation

## 2017-09-25 DIAGNOSIS — E119 Type 2 diabetes mellitus without complications: Secondary | ICD-10-CM | POA: Insufficient documentation

## 2017-09-25 DIAGNOSIS — Z87891 Personal history of nicotine dependence: Secondary | ICD-10-CM | POA: Insufficient documentation

## 2017-09-25 DIAGNOSIS — R279 Unspecified lack of coordination: Secondary | ICD-10-CM | POA: Diagnosis not present

## 2017-09-25 DIAGNOSIS — M2550 Pain in unspecified joint: Secondary | ICD-10-CM | POA: Diagnosis not present

## 2017-09-25 DIAGNOSIS — L89159 Pressure ulcer of sacral region, unspecified stage: Secondary | ICD-10-CM | POA: Insufficient documentation

## 2017-09-25 DIAGNOSIS — Z79899 Other long term (current) drug therapy: Secondary | ICD-10-CM | POA: Insufficient documentation

## 2017-09-25 DIAGNOSIS — I480 Paroxysmal atrial fibrillation: Secondary | ICD-10-CM | POA: Insufficient documentation

## 2017-09-25 DIAGNOSIS — I2510 Atherosclerotic heart disease of native coronary artery without angina pectoris: Secondary | ICD-10-CM | POA: Diagnosis not present

## 2017-09-25 DIAGNOSIS — Z7982 Long term (current) use of aspirin: Secondary | ICD-10-CM | POA: Diagnosis not present

## 2017-09-25 DIAGNOSIS — Z8674 Personal history of sudden cardiac arrest: Secondary | ICD-10-CM | POA: Insufficient documentation

## 2017-09-25 DIAGNOSIS — Z7902 Long term (current) use of antithrombotics/antiplatelets: Secondary | ICD-10-CM | POA: Insufficient documentation

## 2017-09-25 DIAGNOSIS — Z955 Presence of coronary angioplasty implant and graft: Secondary | ICD-10-CM | POA: Insufficient documentation

## 2017-09-25 DIAGNOSIS — I252 Old myocardial infarction: Secondary | ICD-10-CM | POA: Diagnosis not present

## 2017-09-25 DIAGNOSIS — Z7984 Long term (current) use of oral hypoglycemic drugs: Secondary | ICD-10-CM | POA: Insufficient documentation

## 2017-09-25 DIAGNOSIS — Z743 Need for continuous supervision: Secondary | ICD-10-CM | POA: Diagnosis not present

## 2017-09-25 DIAGNOSIS — R339 Retention of urine, unspecified: Secondary | ICD-10-CM | POA: Diagnosis not present

## 2017-09-25 DIAGNOSIS — N319 Neuromuscular dysfunction of bladder, unspecified: Secondary | ICD-10-CM | POA: Insufficient documentation

## 2017-09-25 DIAGNOSIS — Z7401 Bed confinement status: Secondary | ICD-10-CM | POA: Diagnosis not present

## 2017-09-25 LAB — CBC WITH DIFFERENTIAL/PLATELET
Basophils Absolute: 0 10*3/uL (ref 0.0–0.1)
Basophils Relative: 0 %
EOS ABS: 0.3 10*3/uL (ref 0.0–0.7)
EOS PCT: 4 %
HCT: 36.9 % — ABNORMAL LOW (ref 39.0–52.0)
Hemoglobin: 11.9 g/dL — ABNORMAL LOW (ref 13.0–17.0)
LYMPHS ABS: 2.1 10*3/uL (ref 0.7–4.0)
LYMPHS PCT: 32 %
MCH: 27.9 pg (ref 26.0–34.0)
MCHC: 32.2 g/dL (ref 30.0–36.0)
MCV: 86.4 fL (ref 78.0–100.0)
MONO ABS: 0.4 10*3/uL (ref 0.1–1.0)
MONOS PCT: 7 %
Neutro Abs: 3.8 10*3/uL (ref 1.7–7.7)
Neutrophils Relative %: 57 %
PLATELETS: 275 10*3/uL (ref 150–400)
RBC: 4.27 MIL/uL (ref 4.22–5.81)
RDW: 14.2 % (ref 11.5–15.5)
WBC: 6.6 10*3/uL (ref 4.0–10.5)

## 2017-09-25 LAB — PROTIME-INR
INR: 1.04
PROTHROMBIN TIME: 13.5 s (ref 11.4–15.2)

## 2017-09-25 LAB — BASIC METABOLIC PANEL
Anion gap: 8 (ref 5–15)
BUN: 21 mg/dL — AB (ref 6–20)
CHLORIDE: 103 mmol/L (ref 101–111)
CO2: 28 mmol/L (ref 22–32)
CREATININE: 0.95 mg/dL (ref 0.61–1.24)
Calcium: 8.9 mg/dL (ref 8.9–10.3)
GFR calc Af Amer: >60 mL/min (ref >60)
GFR calc non Af Amer: >60 mL/min (ref >60)
GLUCOSE: 94 mg/dL (ref 65–99)
Potassium: 3.8 mmol/L (ref 3.5–5.1)
Sodium: 139 mmol/L (ref 135–145)

## 2017-09-25 LAB — GLUCOSE, CAPILLARY: Glucose-Capillary: 93 mg/dL (ref 65–99)

## 2017-09-25 MED ORDER — SODIUM CHLORIDE 0.9 % IV SOLN
INTRAVENOUS | Status: DC
  Administered 2017-09-25: 10:00:00 via INTRAVENOUS

## 2017-09-25 MED ORDER — FENTANYL CITRATE (PF) 100 MCG/2ML IJ SOLN
INTRAMUSCULAR | Status: DC
Start: 2017-09-25 — End: 2017-09-25
  Filled 2017-09-25: qty 2

## 2017-09-25 MED ORDER — LIDOCAINE HCL (PF) 1 % IJ SOLN
INTRAMUSCULAR | Status: AC | PRN
  Administered 2017-09-25: 20 mL

## 2017-09-25 MED ORDER — FENTANYL CITRATE (PF) 100 MCG/2ML IJ SOLN
INTRAMUSCULAR | Status: AC | PRN
  Administered 2017-09-25 (×2): 50 ug via INTRAVENOUS

## 2017-09-25 MED ORDER — MIDAZOLAM HCL 2 MG/2ML IJ SOLN
INTRAMUSCULAR | Status: AC | PRN
  Administered 2017-09-25 (×2): 1 mg via INTRAVENOUS

## 2017-09-25 MED ORDER — VANCOMYCIN HCL IN DEXTROSE 1-5 GM/200ML-% IV SOLN
INTRAVENOUS | Status: AC
  Filled 2017-09-25: qty 200

## 2017-09-25 MED ORDER — VANCOMYCIN HCL IN DEXTROSE 1-5 GM/200ML-% IV SOLN
1000.0000 mg | INTRAVENOUS | Status: AC
  Administered 2017-09-25: 1000 mg via INTRAVENOUS

## 2017-09-25 MED ORDER — MIDAZOLAM HCL 2 MG/2ML IJ SOLN
INTRAMUSCULAR | Status: AC
  Filled 2017-09-25: qty 4

## 2017-09-25 NOTE — Consult Note (Signed)
Chief Complaint: Patient was seen in consultation today for image guided suprapubic catheter placement  Referring Physician(s): Bell,Eugene D III  Supervising Physician: Gilmer MorWagner, Jaime  [facility]  History of Present Illness: Albert MarylandRicky Slight is a 47 y.o. male with history of hypertension, diabetes, paroxysmal atrial fibrillation, coronary artery disease with history of cardiac arrest status post DES/mid RCA in 2016 complicated by anoxic brain injury (nonverbal), sacral decubitus ulcer and diverting colostomy to prevent sacral wound contamination in 2017.  He also has neurogenic bladder and chronic indwelling Foley catheter which is bothersome  and causing some penile skin irritation.  He presents today following urology evaluation for suprapubic catheter placement.  Past Medical History:  Diagnosis Date  . Anoxic encephalopathy (H[facility])   . CAD (coronary artery disease)    DES proximal RCA and aspiration thrombectomy mid RCA and PLB 01/2015  . Cardiac arrest [facility])    October 2016 with STEMI  . Essential hypertension   . Respiratory failure ([facility])    Prolonged ventilatory course, complicated by mucus plugging and ultimately required tracheostomy 10-02/2015  . 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 Ladona Ridgelaylor in 2012  . ST elevation myocardial infarction (STEMI) of inferolateral wall [facility](HCC)    October 2016   . Type 2 diabetes mellitus (H[facility])   . Ventilator associated pneumonia [facility](HCC)     Past Surgical History:  Procedure Laterality Date  . CARDIAC CATHETERIZATION N/A 01/22/2015   Procedure: Left Heart Cath and Coronary Angiography;  Surgeon: Corky CraftsJayadeep S Varanasi, MD;  [facility];  Service: Cardiovascular;  Laterality: N/A;  . CARDIAC CATHETERIZATION N/A 01/22/2015   Procedure: Coronary Stent Intervention;  Surgeon: Corky CraftsJayadeep S Varanasi, MD;  [facility];  Service: Cardiovascular;   Laterality: N/A;  prox rca    Allergies: Patient has no known allergies.  Medications: Prior to Admission medications   Medication Sig Start Date End Date Taking? Authorizing Provider  acidophilus (RISAQUAD) CAPS capsule Take 1 capsule by mouth daily.   Yes [provider]  Amino Acids-Protein Hydrolys (FEEDING SUPPLEMENT, PRO-STAT SUGAR FREE 64,) LIQD Take 30 mLs by mouth daily.   Yes [provider]  cholecalciferol (VITAMIN D) 1000 units tablet Take 2,000 Units by mouth daily.   Yes [provider]  clindamycin (CLEOCIN) 300 MG capsule Take 600 mg by mouth 3 (three) times daily.   Yes [provider]  clopidogrel (PLAVIX) 75 MG tablet 75 mg daily.   Yes [provider]  enalapril (VASOTEC) 2.5 MG tablet Take 2.5 mg by mouth daily.   Yes [provider]  fentaNYL (DURAGESIC - DOSED MCG/HR) 25 MCG/HR patch Place 25 mcg onto the skin every 3 (three) days.   Yes [provider]  HYDROcodone-acetaminophen (NORCO) 10-325 MG tablet Take 1 tablet by mouth every 8 (eight) hours as needed.   Yes [provider]  Melatonin 3 MG TABS 1 tablet at bedtime.   Yes [provider]  metFORMIN (GLUCOPHAGE) 500 MG tablet Take 1 tablet by mouth 2 (two) times daily.   Yes [provider]  mirtazapine (REMERON) 15 MG tablet 1 tablet at bedtime.   Yes [provider]  Multiple Vitamins-Minerals (THERA-M) TABS 1 tablet daily.   Yes [provider]  rOPINIRole (REQUIP) 1 MG tablet Take 1 mg by mouth 3 (three) times daily.   Yes [provider]  sennosides (SENOKOT) 8.8 MG/5ML syrup Take 10 mLs by mouth  2 (two) times daily.   Yes [provider]  sertraline (ZOLOFT) 20 MG/ML concentrated solution 2.5 mLs daily. 06/12/16 09/25/17 Yes [provider]  vitamin C (ASCORBIC ACID) 500 MG tablet 1 tablet 2 (two) times daily.   Yes [provider]  aspirin 81 MG chewable tablet Place  1 tablet (81 mg total) into feeding tube daily. 02/08/15   Jeanella Craze, NP  atorvastatin (LIPITOR) 80 MG tablet Place 1 tablet (80 mg total) into feeding tube daily at 6 PM. 02/08/15   Jeanella Craze, NP  Dextrose-Sodium Chloride (DEXTROSE 5 % AND 0.45% NACL) infusion Inject 40 mL/hr into the vein continuous. 02/08/15   Jeanella Craze, NP  docusate (COLACE) 60 MG/15ML syrup Take 10 mg by mouth daily.    [provider]  ibuprofen (ADVIL,MOTRIN) 600 MG tablet Take 600 mg by mouth every 8 (eight) hours as needed.    [provider]  Nutritional Supplements (FEEDING SUPPLEMENT, GLUCERNA 1.2 CAL,) LIQD Place 80 mL/hr into feeding tube continuous. 02/08/15   Jeanella Craze, NP  simethicone (MYLICON) 80 MG chewable tablet 1 tablet every 8 (eight) hours as needed.    [provider]  terazosin (HYTRIN) 1 MG capsule 1 capsule at bedtime.    [provider]  zinc sulfate 220 (50 Zn) MG capsule 1 capsule daily.    [provider]     Family History  Problem Relation Age of Onset  . Heart disease Mother 45       MI  . Diabetes Father     Social History   Socioeconomic History  . Marital status: Single    Spouse name: Not on file  . Number of children: Not on file  . Years of education: Not on file  . Highest education level: Not on file  Occupational History  . Not on file  Social Needs  . Financial resource strain: Not on file  . Food insecurity:    Worry: Not on file    Inability: Not on file  . Transportation needs:    Medical: Not on file    Non-medical: Not on file  Tobacco Use  . Smoking status: Former Smoker    Last attempt to quit: 05/01/2005    Years since quitting: 12.4  . Smokeless tobacco: Never Used  Substance and Sexual Activity  . Alcohol use: Yes    Alcohol/week: 0.6 oz    Types: 1 Cans of beer per week  . Drug use: Not on file  . Sexual activity: Not on file  Lifestyle  . Physical activity:    Days per week: Not on  file    Minutes per session: Not on file  . Stress: Not on file  Relationships  . Social connections:    Talks on phone: Not on file    Gets together: Not on file    Attends religious service: Not on file    Active member of club or organization: Not on file    Attends meetings of clubs or organizations: Not on file    Relationship status: Not on file  Other Topics Concern  . Not on file  Social History Narrative  . Not on file      Review of Systems denies fever, headache, chest pain, dyspnea, cough, abdominal pain, nausea, vomiting or bleeding.  Vital Signs: BP 119/72 (BP Location: Right Arm)   Pulse 76   Temp 98.3 F (36.8 C) (Oral)   Resp 20  Wt 220 lb (99.8 kg)   SpO2 95%   BMI 28.25 kg/m   Physical Exam patient awake, responds to questions appropriately with thumbs up or down.  Chest with some mildly diminished breath sounds at bases.  Heart with regular rate and rhythm.  Abdomen soft, positive bowel sounds, intact left-sided colostomy.  No significant pretibial edema.  Imaging: No results found.  Labs:  CBC: No results for input(s): WBC, HGB, HCT, PLT in the last 8760 hours.  COAGS: No results for input(s): INR, APTT in the last 8760 hours.  BMP: Recent Labs    11/03/16 1143  NA 138  K 4.0  CL 104  CO2 26  GLUCOSE 144*  BUN 16  CALCIUM 8.8*  CREATININE 0.71  GFRNONAA >60  GFRAA >60    LIVER FUNCTION TESTS: No results for input(s): BILITOT, AST, ALT, ALKPHOS, PROT, ALBUMIN in the last 8760 hours.  TUMOR MARKERS: No results for input(s): AFPTM, CEA, CA199, CHROMGRNA in the last 8760 hours.  Assessment and Plan: 47 y.o. male with history of hypertension, diabetes, paroxysmal atrial fibrillation, coronary artery disease with history of cardiac arrest status post DES/mid RCA in 2016 complicated by anoxic brain injury (nonverbal), sacral decubitus ulcer and diverting colostomy to prevent sacral wound contamination in 2017.  He also has neurogenic  bladder and chronic indwelling Foley catheter which is bothersome  and causing some penile skin irritation.  He presents today following urology evaluation for suprapubic catheter placement.  Details/risks of procedure, including but not limited to, internal bleeding, infection, injury to adjacent structures discussed with patient with his understanding and consent.  LABS PENDING  Thank you for this interesting consult.  I greatly enjoyed meeting Albert Jensen and look forward to participating in their care.  A copy of this report was sent to the requesting provider on this date.  Electronically Signed: D. Jeananne Rama, PA-C 09/25/2017, 10:09 AM   I spent a total of 25 minutes    in face to face in clinical consultation, greater than 50% of which was counseling/coordinating care for suprapubic catheter placement

## 2017-09-25 NOTE — Progress Notes (Signed)
[facility]

## 2017-09-25 NOTE — Discharge Instructions (Signed)
Suprapubic Catheter Home Guide °A suprapubic catheter is a rubber tube used to drain urine from the bladder into a collection bag. The catheter is inserted into the bladder through a small opening in the in the lower abdomen, near the center of the body, above the pubic bone (suprapubic area). There is a tiny balloon filled with germ-free (sterile) water on the end of the catheter that is in the bladder. The balloon helps to keep the catheter in place. °Your suprapubic catheter may need to be replaced every 4-6 weeks, or as often as recommended by your health care provider. The collection bag must be emptied every day and cleaned every 2-3 days. The collection bag can be put beside your bed at night and attached to your leg during the day. You may have a large collection bag to use at night and a smaller one to use during the day. °What are the risks? °· Urine flow can become blocked. This can happen if the catheter is not working correctly, or if you have a blood clot in your bladder or in the catheter. °· Tissue near the catheter may can become irritated and bleed. °· Bacteria may get into your bladder and cause a urinary tract infection. °How do I change the catheter? °Supplies needed °· Two pairs of sterile gloves. °· Catheter. °· Two syringes. °· Sterile water. °· Sterile cleaning solution. °· Lubricant. °· Collection bags. °Changing the catheter °To replace your catheter, take the following steps: °1. Drink plenty of fluids during the hours before you plan to change the catheter. °2. Wash your hands with soap and water. If soap and water are not available, use hand sanitizer. °3. Lie on your back and put on sterile gloves. °4. Clean the skin around the catheter opening using the sterile cleaning solution. °5. Remove the water from the balloon using a syringe. °6. Slowly remove the catheter. °? Do not pull on the catheter if it seems stuck. °? Call your health care provider immediately if you have difficulty  removing the catheter. °7. Take off the used gloves, and put on a new pair. °8. Put lubricant on the end of the new catheter that will go into your bladder. °9. Gently slide the catheter through the opening in your abdomen and into your bladder. °10. Wait for some urine to start flowing through the catheter. When urine starts to flow through the catheter, use a new syringe to fill the balloon with sterile water. °11. Attach the collection bag to the end of the catheter. Make sure the connection is tight. °12. Remove the gloves and wash your hands with soap and water. ° °How do I care for my skin around the catheter? °Use a clean washcloth and soapy water to clean the skin around your catheter every day. Pat the area dry with a clean towel. °· Do not pull on the catheter. °· Do not use ointment or lotion on this area unless told by your health care provider. °· Check your skin around the catheter every day for signs of infection. Check for: °? Redness, swelling, or pain. °? Fluid or blood. °? Warmth. °? Pus or a bad smell. ° °How do I clean and empty the collection bag? °Clean the collection bag every 2-3 days, or as often as told by your health care provider. To do this, take the following steps: °· Wash your hands with soap and water. If soap and water are not available, use hand sanitizer. °· Disconnect the bag   from the catheter and immediately attach a new bag to the catheter. °· Empty the used bag completely. °· Clean the used bag using one of the following methods: °? Rinse the used bag with warm water and soap. °? Fill the bag with water and add 1 tsp of vinegar. Let it sit for about 30 minutes, then empty the bag. °· Let the bag dry completely, and put it in a clean plastic bag before storing it. ° °Empty the large collection bag every 8 hours. Empty the small collection bag when it is about ? full. To empty your large or small collection bag, take the following steps: °· Always keep the bag below the level  of the catheter. This keeps urine from flowing backwards into the catheter. °· Hold the bag over the toilet or another container. Turn the valve (spigot) at the bottom of the bag to empty the urine. °? Do not touch the opening of the spigot. °? Do not let the opening touch the toilet or container. °· Close the spigot tightly when the bag is empty. ° °What are some general tips? °· Always wash your hands before and after caring for your catheter and collection bag. Use a mild, fragrance-free soap. If soap and water are not available, use hand sanitizer. °· Clean the catheter with soap and water as often as told by your health care provider. °· Always make sure there are no twists or curls (kinks) in the catheter tube. °· Always make sure there are no leaks in the catheter or collection bag. °· Drink enough fluid to keep your urine clear or pale yellow. °· Do not take baths, swim, or use a hot tub. °When should I seek medical care? °Seek medical care if: °· You leak urine. °· You have redness, swelling, or pain around your catheter opening. °· You have fluid or blood coming from your catheter opening. °· Your catheter opening feels warm to the touch. °· You have pus or a bad smell coming from your catheter opening. °· You have a fever or chills. °· Your urine flow slows down. °· Your urine becomes cloudy or smelly. ° °When should I seek immediate medical care? °Seek immediate medical care if your catheter comes out, or if you have: °· Nausea. °· Back pain. °· Difficulty changing your catheter. °· Blood in your urine. °· No urine flow for 1 hour. ° °This information is not intended to replace advice given to you by your health care provider. Make sure you discuss any questions you have with your health care provider. °Document Released: 12/12/2010 Document Revised: 11/23/2015 Document Reviewed: 12/07/2014 °Elsevier Interactive Patient Education © 2018 [employer] °Moderate Conscious Sedation, Adult, Care After °These  instructions provide you with information about caring for yourself after your procedure. Your health care provider may also give you more specific instructions. Your treatment has been planned according to current medical practices, but problems sometimes occur. Call your health care provider if you have any problems or questions after your procedure. °What can I expect after the procedure? °After your procedure, it is common: °· To feel sleepy for several hours. °· To feel clumsy and have poor balance for several hours. °· To have poor judgment for several hours. °· To vomit if you eat too soon. ° °Follow these instructions at home: °For at least 24 hours after the procedure: ° °· Do not: °? Participate in activities where you could fall or become injured. °? Drive. °? Use heavy   machinery. °? Drink alcohol. °? Take sleeping pills or medicines that cause drowsiness. °? Make important decisions or sign legal documents. °? Take care of children on your own. °· Rest. °Eating and drinking °· Follow the diet recommended by your health care provider. °· If you vomit: °? Drink water, juice, or soup when you can drink without vomiting. °? Make sure you have little or no nausea before eating solid foods. °General instructions °· Have a responsible adult stay with you until you are awake and alert. °· Take over-the-counter and prescription medicines only as told by your health care provider. °· If you smoke, do not smoke without supervision. °· Keep all follow-up visits as told by your health care provider. This is important. °Contact a health care provider if: °· You keep feeling nauseous or you keep vomiting. °· You feel light-headed. °· You develop a rash. °· You have a fever. °Get help right away if: °· You have trouble breathing. °This information is not intended to replace advice given to you by your health care provider. Make sure you discuss any questions you have with your health care provider. °Document Released:  01/14/2013 Document Revised: 08/29/2015 Document Reviewed: 07/16/2015 °Elsevier Interactive Patient Education © 2018 [employer] ° °

## 2017-09-25 NOTE — Progress Notes (Signed)
Called [employer] for transportation

## 2017-09-25 NOTE — Procedures (Signed)
Interventional Radiology Procedure Note  Procedure: Placement of a supra-pubic catheter. 84F catheter, that will need upsizing after at least 4 weeks. .  Complications: None Recommendations:  - Ok to use - Would have the referring decide on timing of the foley removal, which would be safe after resolution of any post-op hematuria and adequate urine drainage from the suprapubic - Do not submerge   - Routine care - dc 1 hr   Signed,  Yvone NeuJaime S. Loreta AveWagner, DO

## 2017-09-30 DIAGNOSIS — N319 Neuromuscular dysfunction of bladder, unspecified: Secondary | ICD-10-CM | POA: Diagnosis not present

## 2017-09-30 DIAGNOSIS — E119 Type 2 diabetes mellitus without complications: Secondary | ICD-10-CM | POA: Diagnosis not present

## 2017-09-30 DIAGNOSIS — G894 Chronic pain syndrome: Secondary | ICD-10-CM | POA: Diagnosis not present

## 2017-09-30 DIAGNOSIS — L89159 Pressure ulcer of sacral region, unspecified stage: Secondary | ICD-10-CM | POA: Diagnosis not present

## 2017-10-08 ENCOUNTER — Encounter (HOSPITAL_BASED_OUTPATIENT_CLINIC_OR_DEPARTMENT_OTHER): Payer: Medicare Other | Attending: Internal Medicine

## 2017-10-08 ENCOUNTER — Other Ambulatory Visit (HOSPITAL_COMMUNITY)
Admit: 2017-10-08 | Discharge: 2017-10-08 | Disposition: A | Discharge status: Home / Self Care | Payer: Medicare Other | Source: Ambulatory Visit | Attending: Internal Medicine | Admitting: Internal Medicine

## 2017-10-08 DIAGNOSIS — I2510 Atherosclerotic heart disease of native coronary artery without angina pectoris: Secondary | ICD-10-CM | POA: Diagnosis not present

## 2017-10-08 DIAGNOSIS — L97211 Non-pressure chronic ulcer of right calf limited to breakdown of skin: Secondary | ICD-10-CM | POA: Insufficient documentation

## 2017-10-08 DIAGNOSIS — E1140 Type 2 diabetes mellitus with diabetic neuropathy, unspecified: Secondary | ICD-10-CM | POA: Insufficient documentation

## 2017-10-08 DIAGNOSIS — L97219 Non-pressure chronic ulcer of right calf with unspecified severity: Secondary | ICD-10-CM | POA: Diagnosis present

## 2017-10-08 DIAGNOSIS — I509 Heart failure, unspecified: Secondary | ICD-10-CM | POA: Diagnosis not present

## 2017-10-08 DIAGNOSIS — R5381 Other malaise: Secondary | ICD-10-CM | POA: Diagnosis not present

## 2017-10-08 DIAGNOSIS — L97526 Non-pressure chronic ulcer of other part of left foot with bone involvement without evidence of necrosis: Secondary | ICD-10-CM | POA: Diagnosis not present

## 2017-10-08 DIAGNOSIS — Z743 Need for continuous supervision: Secondary | ICD-10-CM | POA: Diagnosis not present

## 2017-10-08 DIAGNOSIS — L97822 Non-pressure chronic ulcer of other part of left lower leg with fat layer exposed: Secondary | ICD-10-CM | POA: Insufficient documentation

## 2017-10-08 DIAGNOSIS — I252 Old myocardial infarction: Secondary | ICD-10-CM | POA: Diagnosis not present

## 2017-10-08 DIAGNOSIS — I110 Hypertensive heart disease with heart failure: Secondary | ICD-10-CM | POA: Diagnosis not present

## 2017-10-08 DIAGNOSIS — L97221 Non-pressure chronic ulcer of left calf limited to breakdown of skin: Secondary | ICD-10-CM | POA: Insufficient documentation

## 2017-10-08 DIAGNOSIS — L03116 Cellulitis of left lower limb: Secondary | ICD-10-CM | POA: Diagnosis not present

## 2017-10-08 DIAGNOSIS — S81802A Unspecified open wound, left lower leg, initial encounter: Secondary | ICD-10-CM | POA: Diagnosis not present

## 2017-10-08 DIAGNOSIS — L97529 Non-pressure chronic ulcer of other part of left foot with unspecified severity: Secondary | ICD-10-CM | POA: Diagnosis not present

## 2017-10-08 DIAGNOSIS — Z89431 Acquired absence of right foot: Secondary | ICD-10-CM | POA: Diagnosis not present

## 2017-10-08 DIAGNOSIS — S81801A Unspecified open wound, right lower leg, initial encounter: Secondary | ICD-10-CM | POA: Diagnosis not present

## 2017-10-08 DIAGNOSIS — M86672 Other chronic osteomyelitis, left ankle and foot: Secondary | ICD-10-CM | POA: Diagnosis not present

## 2017-10-11 LAB — AEROBIC CULTURE  (SUPERFICIAL SPECIMEN)

## 2017-10-11 LAB — AEROBIC CULTURE W GRAM STAIN (SUPERFICIAL SPECIMEN)

## 2017-10-20 ENCOUNTER — Other Ambulatory Visit: Payer: Self-pay

## 2017-10-20 ENCOUNTER — Emergency Department (HOSPITAL_COMMUNITY): Payer: Medicare Other

## 2017-10-20 ENCOUNTER — Emergency Department (HOSPITAL_COMMUNITY)
Admission: EM | Admit: 2017-10-20 | Discharge: 2017-10-20 | Disposition: A | Discharge status: Home / Self Care | Payer: Medicare Other | Attending: Emergency Medicine | Admitting: Emergency Medicine

## 2017-10-20 ENCOUNTER — Encounter (HOSPITAL_COMMUNITY): Payer: Self-pay | Admitting: Emergency Medicine

## 2017-10-20 DIAGNOSIS — I2510 Atherosclerotic heart disease of native coronary artery without angina pectoris: Secondary | ICD-10-CM | POA: Insufficient documentation

## 2017-10-20 DIAGNOSIS — L539 Erythematous condition, unspecified: Secondary | ICD-10-CM | POA: Diagnosis present

## 2017-10-20 DIAGNOSIS — Z933 Colostomy status: Secondary | ICD-10-CM | POA: Diagnosis not present

## 2017-10-20 DIAGNOSIS — F419 Anxiety disorder, unspecified: Secondary | ICD-10-CM | POA: Diagnosis not present

## 2017-10-20 DIAGNOSIS — R279 Unspecified lack of coordination: Secondary | ICD-10-CM | POA: Diagnosis not present

## 2017-10-20 DIAGNOSIS — E1122 Type 2 diabetes mellitus with diabetic chronic kidney disease: Secondary | ICD-10-CM | POA: Diagnosis not present

## 2017-10-20 DIAGNOSIS — Z79899 Other long term (current) drug therapy: Secondary | ICD-10-CM | POA: Diagnosis not present

## 2017-10-20 DIAGNOSIS — N390 Urinary tract infection, site not specified: Secondary | ICD-10-CM | POA: Diagnosis not present

## 2017-10-20 DIAGNOSIS — M623 Immobility syndrome (paraplegic): Secondary | ICD-10-CM | POA: Diagnosis not present

## 2017-10-20 DIAGNOSIS — L03317 Cellulitis of buttock: Secondary | ICD-10-CM | POA: Insufficient documentation

## 2017-10-20 DIAGNOSIS — N3000 Acute cystitis without hematuria: Secondary | ICD-10-CM | POA: Diagnosis not present

## 2017-10-20 DIAGNOSIS — Z87891 Personal history of nicotine dependence: Secondary | ICD-10-CM | POA: Diagnosis not present

## 2017-10-20 DIAGNOSIS — Z7401 Bed confinement status: Secondary | ICD-10-CM | POA: Diagnosis not present

## 2017-10-20 DIAGNOSIS — E119 Type 2 diabetes mellitus without complications: Secondary | ICD-10-CM | POA: Diagnosis not present

## 2017-10-20 DIAGNOSIS — Z7984 Long term (current) use of oral hypoglycemic drugs: Secondary | ICD-10-CM | POA: Diagnosis not present

## 2017-10-20 DIAGNOSIS — Z7902 Long term (current) use of antithrombotics/antiplatelets: Secondary | ICD-10-CM | POA: Diagnosis not present

## 2017-10-20 DIAGNOSIS — I4581 Long QT syndrome: Secondary | ICD-10-CM | POA: Diagnosis not present

## 2017-10-20 DIAGNOSIS — K219 Gastro-esophageal reflux disease without esophagitis: Secondary | ICD-10-CM | POA: Diagnosis not present

## 2017-10-20 DIAGNOSIS — K625 Hemorrhage of anus and rectum: Secondary | ICD-10-CM | POA: Diagnosis not present

## 2017-10-20 DIAGNOSIS — I10 Essential (primary) hypertension: Secondary | ICD-10-CM | POA: Insufficient documentation

## 2017-10-20 DIAGNOSIS — R402441 Other coma, without documented Glasgow coma scale score, or with partial score reported, in the field [EMT or ambulance]: Secondary | ICD-10-CM | POA: Diagnosis not present

## 2017-10-20 DIAGNOSIS — F329 Major depressive disorder, single episode, unspecified: Secondary | ICD-10-CM | POA: Diagnosis not present

## 2017-10-20 DIAGNOSIS — I129 Hypertensive chronic kidney disease with stage 1 through stage 4 chronic kidney disease, or unspecified chronic kidney disease: Secondary | ICD-10-CM | POA: Diagnosis not present

## 2017-10-20 DIAGNOSIS — Z7982 Long term (current) use of aspirin: Secondary | ICD-10-CM | POA: Diagnosis not present

## 2017-10-20 DIAGNOSIS — N189 Chronic kidney disease, unspecified: Secondary | ICD-10-CM | POA: Diagnosis not present

## 2017-10-20 DIAGNOSIS — I959 Hypotension, unspecified: Secondary | ICD-10-CM | POA: Diagnosis not present

## 2017-10-20 DIAGNOSIS — L89159 Pressure ulcer of sacral region, unspecified stage: Secondary | ICD-10-CM | POA: Diagnosis not present

## 2017-10-20 DIAGNOSIS — K922 Gastrointestinal hemorrhage, unspecified: Secondary | ICD-10-CM | POA: Diagnosis not present

## 2017-10-20 DIAGNOSIS — Z7901 Long term (current) use of anticoagulants: Secondary | ICD-10-CM | POA: Diagnosis not present

## 2017-10-20 DIAGNOSIS — D649 Anemia, unspecified: Secondary | ICD-10-CM | POA: Diagnosis not present

## 2017-10-20 DIAGNOSIS — L03116 Cellulitis of left lower limb: Secondary | ICD-10-CM | POA: Insufficient documentation

## 2017-10-20 DIAGNOSIS — R58 Hemorrhage, not elsewhere classified: Secondary | ICD-10-CM | POA: Diagnosis not present

## 2017-10-20 DIAGNOSIS — R0989 Other specified symptoms and signs involving the circulatory and respiratory systems: Secondary | ICD-10-CM | POA: Diagnosis not present

## 2017-10-20 LAB — CBC WITH DIFFERENTIAL/PLATELET
BASOS ABS: 0 10*3/uL (ref 0.0–0.1)
BASOS PCT: 0 %
Eosinophils Absolute: 0.2 10*3/uL (ref 0.0–0.7)
Eosinophils Relative: 3 %
HEMATOCRIT: 31.6 % — AB (ref 39.0–52.0)
HEMOGLOBIN: 10.2 g/dL — AB (ref 13.0–17.0)
LYMPHS PCT: 21 %
Lymphs Abs: 1.6 10*3/uL (ref 0.7–4.0)
MCH: 27.7 pg (ref 26.0–34.0)
MCHC: 32.3 g/dL (ref 30.0–36.0)
MCV: 85.9 fL (ref 78.0–100.0)
MONO ABS: 0.4 10*3/uL (ref 0.1–1.0)
Monocytes Relative: 6 %
NEUTROS ABS: 5.3 10*3/uL (ref 1.7–7.7)
NEUTROS PCT: 70 %
Platelets: 307 10*3/uL (ref 150–400)
RBC: 3.68 MIL/uL — ABNORMAL LOW (ref 4.22–5.81)
RDW: 14.7 % (ref 11.5–15.5)
WBC: 7.6 10*3/uL (ref 4.0–10.5)

## 2017-10-20 LAB — URINALYSIS, ROUTINE W REFLEX MICROSCOPIC
Bilirubin Urine: NEGATIVE
Glucose, UA: NEGATIVE mg/dL
Hgb urine dipstick: NEGATIVE
Ketones, ur: NEGATIVE mg/dL
NITRITE: POSITIVE — AB
PH: 9 — AB (ref 5.0–8.0)
PROTEIN: 100 mg/dL — AB
SPECIFIC GRAVITY, URINE: 1.017 (ref 1.005–1.030)

## 2017-10-20 LAB — COMPREHENSIVE METABOLIC PANEL
ALBUMIN: 2.9 g/dL — AB (ref 3.5–5.0)
ALK PHOS: 70 U/L (ref 38–126)
ALT: 15 U/L (ref 0–44)
AST: 13 U/L — AB (ref 15–41)
Anion gap: 9 (ref 5–15)
BILIRUBIN TOTAL: 0.4 mg/dL (ref 0.3–1.2)
BUN: 18 mg/dL (ref 6–20)
CALCIUM: 8.8 mg/dL — AB (ref 8.9–10.3)
CO2: 25 mmol/L (ref 22–32)
CREATININE: 1.19 mg/dL (ref 0.61–1.24)
Chloride: 104 mmol/L (ref 98–111)
GFR calc Af Amer: >60 mL/min (ref >60)
GLUCOSE: 111 mg/dL — AB (ref 70–99)
POTASSIUM: 3.6 mmol/L (ref 3.5–5.1)
Sodium: 138 mmol/L (ref 135–145)
TOTAL PROTEIN: 7.5 g/dL (ref 6.5–8.1)

## 2017-10-20 LAB — POC OCCULT BLOOD, ED: FECAL OCCULT BLD: NEGATIVE

## 2017-10-20 LAB — TROPONIN I

## 2017-10-20 MED ORDER — DOXYCYCLINE HYCLATE 100 MG PO CAPS: 100.0000 mg | ORAL_CAPSULE | Freq: Two times a day (BID) | ORAL | 0 refills | Status: DC

## 2017-10-20 MED ORDER — SODIUM CHLORIDE 0.9 % IV SOLN
1.0000 g | Freq: Once | INTRAVENOUS | Status: AC
  Administered 2017-10-20: 1 g via INTRAVENOUS
  Filled 2017-10-20: qty 10

## 2017-10-20 NOTE — ED Triage Notes (Signed)
Per [facility] he started having bright red gi bleeding. He is on plavix.

## 2017-10-20 NOTE — Discharge Instructions (Addendum)
Prescription for antibiotic.  Recommend visit to wound care center this week.  Simple dressing to left foot wound.  Follow-up with primary care doctor.

## 2017-10-21 DIAGNOSIS — M2550 Pain in unspecified joint: Secondary | ICD-10-CM | POA: Diagnosis not present

## 2017-10-21 DIAGNOSIS — L97822 Non-pressure chronic ulcer of other part of left lower leg with fat layer exposed: Secondary | ICD-10-CM | POA: Diagnosis not present

## 2017-10-21 DIAGNOSIS — L97221 Non-pressure chronic ulcer of left calf limited to breakdown of skin: Secondary | ICD-10-CM | POA: Diagnosis not present

## 2017-10-21 DIAGNOSIS — I509 Heart failure, unspecified: Secondary | ICD-10-CM | POA: Diagnosis not present

## 2017-10-21 DIAGNOSIS — S81801A Unspecified open wound, right lower leg, initial encounter: Secondary | ICD-10-CM | POA: Diagnosis not present

## 2017-10-21 DIAGNOSIS — M86672 Other chronic osteomyelitis, left ankle and foot: Secondary | ICD-10-CM | POA: Diagnosis not present

## 2017-10-21 DIAGNOSIS — I110 Hypertensive heart disease with heart failure: Secondary | ICD-10-CM | POA: Diagnosis not present

## 2017-10-21 DIAGNOSIS — S81802A Unspecified open wound, left lower leg, initial encounter: Secondary | ICD-10-CM | POA: Diagnosis not present

## 2017-10-21 DIAGNOSIS — Z7401 Bed confinement status: Secondary | ICD-10-CM | POA: Diagnosis not present

## 2017-10-21 DIAGNOSIS — R531 Weakness: Secondary | ICD-10-CM | POA: Diagnosis not present

## 2017-10-21 DIAGNOSIS — L97526 Non-pressure chronic ulcer of other part of left foot with bone involvement without evidence of necrosis: Secondary | ICD-10-CM | POA: Diagnosis not present

## 2017-10-21 DIAGNOSIS — L97211 Non-pressure chronic ulcer of right calf limited to breakdown of skin: Secondary | ICD-10-CM | POA: Diagnosis not present

## 2017-10-21 DIAGNOSIS — L03116 Cellulitis of left lower limb: Secondary | ICD-10-CM | POA: Diagnosis not present

## 2017-10-21 DIAGNOSIS — R5381 Other malaise: Secondary | ICD-10-CM | POA: Diagnosis not present

## 2017-10-21 DIAGNOSIS — Z743 Need for continuous supervision: Secondary | ICD-10-CM | POA: Diagnosis not present

## 2017-10-22 NOTE — ED Provider Notes (Signed)
[facility] EMERGENCY DEPARTMENT Provider Note   CSN: [id number] Arrival date & time: 10/20/17  1029     History   Chief Complaint Chief Complaint  Patient presents with  . GI Bleeding    HPI Albert Jensen is a 47 y.o. male.  Level 5 caveat for inability to communicate history.  Patient is severely impaired secondary to cardiac arrest in 2016 leading to anoxic encephalopathy.  He currently resides in a nursing home.  Original report stated he was bleeding from his colostomy bag, but his brother states the bleeding is on his sacral area.  He has a suprapubic catheter.  There is also concern about erythema on his left foot.  Family reports normal behavior.     Past Medical History:  Diagnosis Date  . Anoxic encephalopathy (HCC)   . CAD (coronary artery disease)    DES proximal RCA and aspiration thrombectomy mid RCA and PLB 01/2015  . Cardiac arrest [facility])    October 2016 with STEMI  . Essential hypertension   . Respiratory failure (HCC)    Prolonged ventilatory course, complicated by mucus plugging and ultimately required tracheostomy 10-02/2015  . 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 Ladona Ridgel in 2012  . ST elevation myocardial infarction (STEMI) of inferolateral wall [facility])    October 2016   . Type 2 diabetes mellitus (HCC)   . Ventilator associated pneumonia [facility])     Patient Active Problem List   Diagnosis Date Noted  . Acute respiratory failure with hypoxemia (HCC)   . Tracheostomy status (HCC)   . HCAP (healthcare-associated pneumonia)   . Atelectasis   . Encephalopathy   . Acute renal failure (HCC)   . Urinary tract infection, site not specified   . Type II diabetes mellitus, uncontrolled (HCC)   . Disorientation   . Pressure ulcer 01/31/2015  . Acute respiratory failure (HCC)   . Anoxic encephalopathy (HCC) 01/25/2015  . Acute respiratory failure with hypoxia (HCC) 01/25/2015  . Acute ST elevation  myocardial infarction (STEMI) (HCC)   . Respiratory failure (HCC)   . Cardiac arrest (HCC) 01/22/2015  . Acute MI, inferolateral wall, initial episode of care (HCC)   . ESSENTIAL HYPERTENSION, BENIGN 05/25/2010  . VENTRICULAR TACHYCARDIA, PAROXYSMAL 05/25/2010    Past Surgical History:  Procedure Laterality Date  . CARDIAC CATHETERIZATION N/A 01/22/2015   Procedure: Left Heart Cath and Coronary Angiography;  Surgeon: Corky Crafts, MD;  Location: [facility] INVASIVE CV LAB;  Service: Cardiovascular;  Laterality: N/A;  . CARDIAC CATHETERIZATION N/A 01/22/2015   Procedure: Coronary Stent Intervention;  Surgeon: Corky Crafts, MD;  Location: [facility] INVASIVE CV LAB;  Service: Cardiovascular;  Laterality: N/A;  prox rca        Home Medications    Prior to Admission medications   Medication Sig Start Date End Date Taking? Authorizing Provider  acetaminophen (TYLENOL) 325 MG tablet Take 650 mg by mouth every 4 (four) hours as needed for mild pain.   Yes [provider]  acidophilus (RISAQUAD) CAPS capsule Take 1 capsule by mouth daily.   Yes [provider]  Amino Acids-Protein Hydrolys (FEEDING SUPPLEMENT, PRO-STAT SUGAR FREE 64,) LIQD Take 30 mLs by mouth daily.   Yes [provider]  cholecalciferol (VITAMIN D) 1000 units tablet Take 2,000 Units by mouth daily.   Yes [provider]  clopidogrel (PLAVIX) 75 MG tablet 75 mg daily.   Yes [provider]  enalapril (VASOTEC) 2.5 MG tablet  Take 2.5 mg by mouth daily.   Yes [provider]  famotidine (PEPCID) 20 MG tablet Take 20 mg by mouth daily.   Yes [provider]  fentaNYL (DURAGESIC - DOSED MCG/HR) 50 MCG/HR Place 50 mcg onto the skin every 3 (three) days.   Yes [provider]  HYDROcodone-acetaminophen (NORCO) 10-325 MG tablet Take 1 tablet by mouth every 6 (six) hours as needed for moderate pain.    Yes [provider]  ketoconazole (NIZORAL) 2 %  shampoo Apply 1 application topically 2 (two) times a week. mon/thurs   Yes [provider]  Melatonin 3 MG TABS 1 tablet at bedtime.   Yes [provider]  metFORMIN (GLUCOPHAGE) 500 MG tablet Take 1 tablet by mouth 2 (two) times daily.   Yes [provider]  mirtazapine (REMERON) 15 MG tablet 1 tablet at bedtime.   Yes [provider]  Multiple Vitamins-Minerals (THERA-M) TABS 1 tablet daily.   Yes [provider]  rOPINIRole (REQUIP) 1 MG tablet Take 1 mg by mouth at bedtime.    Yes [provider]  sennosides-docusate sodium (SENOKOT-S) 8.6-50 MG tablet Take 1 tablet by mouth daily.   Yes [provider]  sertraline (ZOLOFT) 25 MG tablet Take 25 mg by mouth daily.   Yes [provider]  sulfamethoxazole-trimethoprim (BACTRIM DS,SEPTRA DS) 800-160 MG tablet Take 1 tablet by mouth 2 (two) times daily. Starting 10/16/2017 @@ 2000 x 10 days.   Yes [provider]  trolamine salicylate (ASPERCREME) 10 % cream Apply 1 application topically 2 (two) times daily.   Yes [provider]  vitamin C (ASCORBIC ACID) 500 MG tablet 1 tablet 2 (two) times daily.   Yes [provider]  aspirin 81 MG chewable tablet Place 1 tablet (81 mg total) into feeding tube daily. 02/08/15   Jeanella Craze, NP  atorvastatin (LIPITOR) 80 MG tablet Place 1 tablet (80 mg total) into feeding tube daily at 6 PM. 02/08/15   Jeanella Craze, NP  Dextrose-Sodium Chloride (DEXTROSE 5 % AND 0.45% NACL) infusion Inject 40 mL/hr into the vein continuous. 02/08/15   Jeanella Craze, NP  doxycycline (VIBRAMYCIN) 100 MG capsule Take 1 capsule (100 mg total) by mouth 2 (two) times daily. 10/20/17   Donnetta Hutching, MD  Nutritional Supplements (FEEDING SUPPLEMENT, GLUCERNA 1.2 CAL,) LIQD Place 80 mL/hr into feeding tube continuous. 02/08/15   Jeanella Craze, NP    Family History Family History  Problem Relation Age of Onset  . Heart disease Mother  70       MI  . Diabetes Father     Social History Social History   Tobacco Use  . Smoking status: Former Smoker    Last attempt to quit: 05/01/2005    Years since quitting: 12.4  . Smokeless tobacco: Never Used  Substance Use Topics  . Alcohol use: Yes    Alcohol/week: 0.6 oz    Types: 1 Cans of beer per week  . Drug use: Not on file     Allergies   Patient has no known allergies.   Review of Systems Review of Systems  Unable to perform ROS: Other (Patient cannot verbalize.)     Physical Exam Updated Vital Signs BP 112/68 (BP Location: Left Arm)   Pulse 90   Temp 99 F (37.2 C) (Oral)   Resp 16   Ht 5\' 11"  (1.803 m)   Wt 99.8 kg (220 lb)   SpO2 98%  BMI 30.68 kg/m   Physical Exam  Constitutional:  Alert, nontoxic-appearing.  HENT:  Head: Normocephalic and atraumatic.  Eyes: Conjunctivae are normal.  Neck: Neck supple.  Cardiovascular: Normal rate and regular rhythm.  Pulmonary/Chest: Effort normal and breath sounds normal.  Abdominal:  Functioning colostomy in the left abdomen without evidence of blood.  Musculoskeletal:  Paraplegic  Neurological: He is alert.  Skin:  Left foot: Area of erythema on the distal lateral aspect of the foot consistent with cellulitis.  Sacral area examined.  No pressure ulcer noted.  There is some minimal serosanguineous drainage from the soft tissue.  Psychiatric:  Normal mental status per family.  Nursing note and vitals reviewed.    ED Treatments / Results  Labs (all labs ordered are listed, but only abnormal results are displayed) Labs Reviewed  URINE CULTURE - Abnormal; Notable for the following components:      Result Value   Culture   (*)    Value: >=100,000 COLONIES/mL PROTEUS MIRABILIS SUSCEPTIBILITIES TO FOLLOW Performed at [facility], [address] [place], [place] [address]    All other components within normal limits  CBC WITH DIFFERENTIAL/PLATELET - Abnormal; Notable for the following  components:   RBC 3.68 (*)    Hemoglobin 10.2 (*)    HCT 31.6 (*)    All other components within normal limits  COMPREHENSIVE METABOLIC PANEL - Abnormal; Notable for the following components:   Glucose, Bld 111 (*)    Calcium 8.8 (*)    Albumin 2.9 (*)    AST 13 (*)    All other components within normal limits  URINALYSIS, ROUTINE W REFLEX MICROSCOPIC - Abnormal; Notable for the following components:   APPearance TURBID (*)    pH 9.0 (*)    Protein, ur 100 (*)    Nitrite POSITIVE (*)    Leukocytes, UA MODERATE (*)    Bacteria, UA RARE (*)    All other components within normal limits  CULTURE, BLOOD (ROUTINE X 2)  CULTURE, BLOOD (ROUTINE X 2)  TROPONIN I  POC OCCULT BLOOD, ED  POC OCCULT BLOOD, ED    EKG EKG Interpretation  Date/Time:  Sunday October 20 2017 11:29:11 EDT Ventricular Rate:  96 PR Interval:    QRS Duration: 83 QT Interval:  410 QTC Calculation: 519 R Axis:   -13 Text Interpretation:  Sinus rhythm Abnormal R-wave progression, early transition Left ventricular hypertrophy Borderline T abnormalities, diffuse leads Prolonged QT interval Confirmed by Donnetta Hutchingook, Ehab Humber [phone]([id number]) on 10/20/2017 11:44:59 AM   Radiology Dg Chest Port 1 View  Result Date: 10/20/2017 CLINICAL DATA:  FEVER, PER ER NOTE, Per EMS, he started having bright red GI bleeding PATIENT NON VERBAL, ONLY HISTORY OBTAINED PER PATIENTS FAMILY IS THAT HE STARTED HAVING BLEEDING FROM HIS RECTUM THIS MORNING HISTORY OF RVOT EXAM: PORTABLE CHEST 1 VIEW COMPARISON:  03/07/2015 FINDINGS: Shallow inflation. Heart size is mildly prominent. There is mild pulmonary vascular congestion but no overt edema. No focal consolidations. Tracheostomy tube has been removed. There is gaseous distension of the stomach. IMPRESSION: 1. Shallow inflation. 2. Pulmonary vascular congestion/early edema. 3. Gaseous distension of the stomach. Electronically Signed   By: Norva PavlovElizabeth  Brown M.D.   On: 10/20/2017 11:57    Procedures Procedures  (including critical care time)  Medications Ordered in ED Medications  cefTRIAXone (ROCEPHIN) 1 g in sodium chloride 0.9 % 100 mL IVPB (0 g Intravenous Stopped 10/20/17 1700)     Initial Impression / Assessment and Plan / ED  Course  I have reviewed the triage vital signs and the nursing notes.  Pertinent labs & imaging results that were available during my care of the patient were reviewed by me and considered in my medical decision making (see chart for details).     Patient presents with concern of bleeding from the sacral area, erythema on the left foot.  There is clearly cellulitis in the foot, but his sacral area is more consistent with early skin breakdown.  There may be a small component of cellulitis.  Evidence of a urinary tract infection.  IV Rocephin given in the emergency department.  His family reports normal behavior.  Discharge medication doxycycline 100 mg.  He will follow-up with the wound care center this week.  They understand to return if worse.  Final Clinical Impressions(s) / ED Diagnoses   Final diagnoses:  Cellulitis of left foot  Cellulitis of buttock  Urinary tract infection without hematuria, site unspecified    ED Discharge Orders        Ordered    doxycycline (VIBRAMYCIN) 100 MG capsule  2 times daily     10/20/17 1603       Donnetta Hutching, MD 10/22/17 [phone]

## 2017-10-23 LAB — URINE CULTURE: Culture: >=100000 — AB

## 2017-10-24 ENCOUNTER — Telehealth: Payer: Self-pay | Admitting: *Deleted

## 2017-10-24 NOTE — Progress Notes (Signed)
ED Antimicrobial Stewardship Positive Culture Follow Up   Albert Jensen is an 47 y.o. male who presented to [facility] on 10/20/2017 with a chief complaint of  Chief Complaint  Patient presents with  . GI Bleeding    Recent Results (from the past 720 hour(s))  Aerobic Culture (superficial specimen)     Status: None   Collection Time: 10/08/17  2:35 PM  Result Value Ref Range Status   Specimen Description   Final    FOOT LEFT Performed at [facility], [address], [place], [place] [address]    Special Requests   Final    NONE Performed at [facility], [address] [place], [place] [address]    Gram Stain   Final    RARE WBC PRESENT, PREDOMINANTLY PMN RARE GRAM POSITIVE COCCI    Culture   Final    FEW METHICILLIN RESISTANT STAPHYLOCOCCUS AUREUS RARE PROTEUS MIRABILIS    Report Status 10/11/2017 FINAL  Final   Organism ID, Bacteria METHICILLIN RESISTANT STAPHYLOCOCCUS AUREUS  Final   Organism ID, Bacteria PROTEUS MIRABILIS  Final      Susceptibility   Methicillin resistant staphylococcus aureus - MIC*    CIPROFLOXACIN >=8 RESISTANT Resistant     ERYTHROMYCIN >=8 RESISTANT Resistant     GENTAMICIN >=16 RESISTANT Resistant     OXACILLIN >=4 RESISTANT Resistant     TETRACYCLINE >=16 RESISTANT Resistant     VANCOMYCIN <=0.5 SENSITIVE Sensitive     TRIMETH/SULFA 20 SENSITIVE Sensitive     CLINDAMYCIN >=8 RESISTANT Resistant     RIFAMPIN <=0.5 SENSITIVE Sensitive     Inducible Clindamycin NEGATIVE Sensitive     * FEW METHICILLIN RESISTANT STAPHYLOCOCCUS AUREUS   Proteus mirabilis - MIC*    AMPICILLIN <=2 SENSITIVE Sensitive     CEFAZOLIN 8 SENSITIVE Sensitive     CEFEPIME <=1 SENSITIVE Sensitive     CEFTAZIDIME <=1 SENSITIVE Sensitive     CEFTRIAXONE <=1 SENSITIVE Sensitive     CIPROFLOXACIN 1 SENSITIVE Sensitive     GENTAMICIN <=1 SENSITIVE Sensitive     IMIPENEM 8 INTERMEDIATE Intermediate     TRIMETH/SULFA <=20 SENSITIVE  Sensitive     AMPICILLIN/SULBACTAM <=2 SENSITIVE Sensitive     PIP/TAZO <=4 SENSITIVE Sensitive     * RARE PROTEUS MIRABILIS  Blood culture (routine x 2)     Status: None (Preliminary result)   Collection Time: 10/20/17 11:33 AM  Result Value Ref Range Status   Specimen Description RIGHT ANTECUBITAL  Final   Special Requests   Final    BOTTLES DRAWN AEROBIC AND ANAEROBIC Blood Culture adequate volume   Culture   Final    NO GROWTH 4 DAYS Performed at [facility], [address], [place], [place] [address]    Report Status PENDING  Incomplete  Blood culture (routine x 2)     Status: None (Preliminary result)   Collection Time: 10/20/17 11:34 AM  Result Value Ref Range Status   Specimen Description BLOOD RIGHT HAND  Final   Special Requests   Final    BOTTLES DRAWN AEROBIC AND ANAEROBIC Blood Culture adequate volume   Culture   Final    NO GROWTH 4 DAYS Performed at [facility], [address] [place], [place] [address]    Report Status PENDING  Incomplete  Urine culture     Status: Abnormal   Collection Time: 10/20/17 12:42 PM  Result Value Ref Range Status   Specimen Description   Final  URINE, CLEAN CATCH Performed at [facility], [address] [place], [place] [address]    Special Requests   Final    NONE Performed at [facility], [address] [place], [place] [address]    Culture >=100,000 COLONIES/mL PROTEUS MIRABILIS (A)  Final   Report Status 10/23/2017 FINAL  Final   Organism ID, Bacteria PROTEUS MIRABILIS (A)  Final      Susceptibility   Proteus mirabilis - MIC*    AMPICILLIN <=2 SENSITIVE Sensitive     CEFAZOLIN <=4 SENSITIVE Sensitive     CEFTRIAXONE <=1 SENSITIVE Sensitive     CIPROFLOXACIN >=4 RESISTANT Resistant     GENTAMICIN 8 INTERMEDIATE Intermediate     IMIPENEM 2 SENSITIVE Sensitive     NITROFURANTOIN 128 RESISTANT Resistant     TRIMETH/SULFA >=320 RESISTANT Resistant     AMPICILLIN/SULBACTAM <=2 SENSITIVE Sensitive     PIP/TAZO <=4  SENSITIVE Sensitive     * >=100,000 COLONIES/mL PROTEUS MIRABILIS    [x]  Treated with bactrim, organism resistant to prescribed antimicrobial []  Patient discharged originally without antimicrobial agent and treatment is now indicated  New antibiotic prescription: cephalexin 500mg  PO BID x 10 days  ED Provider: Army MeliaLaura Murphy, PA   Albert Jensen, Albert Jensen 10/24/2017, 9:27 AM Clinical Pharmacist Monday - Friday phone -  [phone] Saturday - Sunday phone - [phone]

## 2017-10-24 NOTE — Telephone Encounter (Incomplete)
Post ED Visit - Positive Culture Follow-up: Successful Patient Follow-Up  Culture assessed and recommendations reviewed by:  []  Enzo BiNathan Batchelder, Pharm.D. []  Celedonio MiyamotoJeremy Frens, Pharm.D., BCPS AQ-ID []  Garvin FilaMike Maccia, Pharm.D., BCPS []  Georgina PillionElizabeth Martin, [address]D., BCPS []  Alum RockMinh Pham, [address]Pharm[address]D., BCPS, AAHIVP []  Estella HuskMichelle Turner, Pharm.D., BCPS, AAHIVP []  Lysle Pearlachel Rumbarger, PharmD, BCPS []  Phillips Climeshuy Dang, PharmD, BCPS []  Agapito GamesAlison Masters, PharmD, BCPS []  Verlan FriendsErin Deja, PharmD  Positive *** culture  []  Patient discharged without antimicrobial prescription and treatment is now indicated [x]  Organism is resistant to prescribed ED discharge antimicrobial []  Patient with positive blood cultures  Changes discussed with ED provider:Laura Eulah PontMurphy, PA-C New antibiotic prescription Keflex 500mg  PO BID x 10 days [facility] Att: Maryelizabeth KaufmannCarol Vanderwerf [phone]  [facility] [place], date 10/24/2017, time 0940   Lysle PearlRobertson, Albert Jensen 10/24/2017, 9:46 AM

## 2017-10-25 LAB — CULTURE, BLOOD (ROUTINE X 2)
Culture: NO GROWTH
Culture: NO GROWTH
Special Requests: ADEQUATE
Special Requests: ADEQUATE

## 2017-11-04 ENCOUNTER — Other Ambulatory Visit (HOSPITAL_COMMUNITY)
Admission: RE | Admit: 2017-11-04 | Discharge: 2017-11-04 | Disposition: A | Discharge status: Home / Self Care | Payer: Medicare Other | Source: Other Acute Inpatient Hospital | Attending: Internal Medicine | Admitting: Internal Medicine

## 2017-11-04 ENCOUNTER — Other Ambulatory Visit (HOSPITAL_BASED_OUTPATIENT_CLINIC_OR_DEPARTMENT_OTHER): Payer: Self-pay | Admitting: Internal Medicine

## 2017-11-04 DIAGNOSIS — S91302A Unspecified open wound, left foot, initial encounter: Secondary | ICD-10-CM | POA: Diagnosis not present

## 2017-11-04 DIAGNOSIS — I110 Hypertensive heart disease with heart failure: Secondary | ICD-10-CM | POA: Diagnosis not present

## 2017-11-04 DIAGNOSIS — L97526 Non-pressure chronic ulcer of other part of left foot with bone involvement without evidence of necrosis: Secondary | ICD-10-CM | POA: Diagnosis not present

## 2017-11-04 DIAGNOSIS — M86172 Other acute osteomyelitis, left ankle and foot: Secondary | ICD-10-CM | POA: Diagnosis not present

## 2017-11-04 DIAGNOSIS — Z743 Need for continuous supervision: Secondary | ICD-10-CM | POA: Diagnosis not present

## 2017-11-04 DIAGNOSIS — I509 Heart failure, unspecified: Secondary | ICD-10-CM | POA: Diagnosis not present

## 2017-11-04 DIAGNOSIS — R531 Weakness: Secondary | ICD-10-CM | POA: Diagnosis not present

## 2017-11-04 DIAGNOSIS — L97822 Non-pressure chronic ulcer of other part of left lower leg with fat layer exposed: Secondary | ICD-10-CM | POA: Diagnosis not present

## 2017-11-04 DIAGNOSIS — L97211 Non-pressure chronic ulcer of right calf limited to breakdown of skin: Secondary | ICD-10-CM | POA: Diagnosis not present

## 2017-11-04 DIAGNOSIS — E11621 Type 2 diabetes mellitus with foot ulcer: Secondary | ICD-10-CM | POA: Insufficient documentation

## 2017-11-04 DIAGNOSIS — L97221 Non-pressure chronic ulcer of left calf limited to breakdown of skin: Secondary | ICD-10-CM | POA: Diagnosis not present

## 2017-11-05 DIAGNOSIS — R338 Other retention of urine: Secondary | ICD-10-CM | POA: Diagnosis not present

## 2017-11-06 DIAGNOSIS — Z933 Colostomy status: Secondary | ICD-10-CM | POA: Diagnosis not present

## 2017-11-06 DIAGNOSIS — I6381 Other cerebral infarction due to occlusion or stenosis of small artery: Secondary | ICD-10-CM | POA: Diagnosis not present

## 2017-11-06 DIAGNOSIS — N319 Neuromuscular dysfunction of bladder, unspecified: Secondary | ICD-10-CM | POA: Diagnosis not present

## 2017-11-06 DIAGNOSIS — I4891 Unspecified atrial fibrillation: Secondary | ICD-10-CM | POA: Diagnosis not present

## 2017-11-07 LAB — AEROBIC CULTURE W GRAM STAIN (SUPERFICIAL SPECIMEN): Gram Stain: NONE SEEN

## 2017-11-07 LAB — AEROBIC CULTURE  (SUPERFICIAL SPECIMEN)

## 2017-11-19 DIAGNOSIS — G2581 Restless legs syndrome: Secondary | ICD-10-CM | POA: Diagnosis not present

## 2017-11-19 DIAGNOSIS — I10 Essential (primary) hypertension: Secondary | ICD-10-CM | POA: Diagnosis not present

## 2017-11-19 DIAGNOSIS — K219 Gastro-esophageal reflux disease without esophagitis: Secondary | ICD-10-CM | POA: Diagnosis not present

## 2017-11-19 DIAGNOSIS — N401 Enlarged prostate with lower urinary tract symptoms: Secondary | ICD-10-CM | POA: Diagnosis not present

## 2017-11-26 DIAGNOSIS — Z79899 Other long term (current) drug therapy: Secondary | ICD-10-CM | POA: Diagnosis not present

## 2017-12-04 ENCOUNTER — Encounter (HOSPITAL_BASED_OUTPATIENT_CLINIC_OR_DEPARTMENT_OTHER): Payer: Medicare Other | Attending: Internal Medicine

## 2017-12-12 ENCOUNTER — Encounter (HOSPITAL_BASED_OUTPATIENT_CLINIC_OR_DEPARTMENT_OTHER): Payer: Medicare Other | Attending: Internal Medicine

## 2017-12-12 DIAGNOSIS — B9562 Methicillin resistant Staphylococcus aureus infection as the cause of diseases classified elsewhere: Secondary | ICD-10-CM | POA: Insufficient documentation

## 2017-12-12 DIAGNOSIS — E1140 Type 2 diabetes mellitus with diabetic neuropathy, unspecified: Secondary | ICD-10-CM | POA: Insufficient documentation

## 2017-12-12 DIAGNOSIS — L02215 Cutaneous abscess of perineum: Secondary | ICD-10-CM | POA: Insufficient documentation

## 2017-12-12 DIAGNOSIS — L97524 Non-pressure chronic ulcer of other part of left foot with necrosis of bone: Secondary | ICD-10-CM | POA: Insufficient documentation

## 2017-12-12 DIAGNOSIS — E1169 Type 2 diabetes mellitus with other specified complication: Secondary | ICD-10-CM | POA: Insufficient documentation

## 2017-12-12 DIAGNOSIS — I2510 Atherosclerotic heart disease of native coronary artery without angina pectoris: Secondary | ICD-10-CM | POA: Insufficient documentation

## 2017-12-12 DIAGNOSIS — I252 Old myocardial infarction: Secondary | ICD-10-CM | POA: Insufficient documentation

## 2017-12-12 DIAGNOSIS — E11621 Type 2 diabetes mellitus with foot ulcer: Secondary | ICD-10-CM | POA: Insufficient documentation

## 2017-12-12 DIAGNOSIS — I110 Hypertensive heart disease with heart failure: Secondary | ICD-10-CM | POA: Insufficient documentation

## 2017-12-12 DIAGNOSIS — M86372 Chronic multifocal osteomyelitis, left ankle and foot: Secondary | ICD-10-CM | POA: Insufficient documentation

## 2017-12-12 DIAGNOSIS — L03116 Cellulitis of left lower limb: Secondary | ICD-10-CM | POA: Insufficient documentation

## 2017-12-12 DIAGNOSIS — I509 Heart failure, unspecified: Secondary | ICD-10-CM | POA: Insufficient documentation

## 2017-12-17 DIAGNOSIS — E119 Type 2 diabetes mellitus without complications: Secondary | ICD-10-CM | POA: Diagnosis not present

## 2017-12-17 DIAGNOSIS — B9562 Methicillin resistant Staphylococcus aureus infection as the cause of diseases classified elsewhere: Secondary | ICD-10-CM | POA: Diagnosis not present

## 2017-12-17 DIAGNOSIS — N319 Neuromuscular dysfunction of bladder, unspecified: Secondary | ICD-10-CM | POA: Diagnosis not present

## 2017-12-17 DIAGNOSIS — L89159 Pressure ulcer of sacral region, unspecified stage: Secondary | ICD-10-CM | POA: Diagnosis not present

## 2017-12-26 DIAGNOSIS — I252 Old myocardial infarction: Secondary | ICD-10-CM | POA: Diagnosis not present

## 2017-12-26 DIAGNOSIS — I509 Heart failure, unspecified: Secondary | ICD-10-CM | POA: Diagnosis not present

## 2017-12-26 DIAGNOSIS — E11621 Type 2 diabetes mellitus with foot ulcer: Secondary | ICD-10-CM | POA: Diagnosis not present

## 2017-12-26 DIAGNOSIS — M86172 Other acute osteomyelitis, left ankle and foot: Secondary | ICD-10-CM | POA: Diagnosis not present

## 2017-12-26 DIAGNOSIS — E1140 Type 2 diabetes mellitus with diabetic neuropathy, unspecified: Secondary | ICD-10-CM | POA: Diagnosis not present

## 2017-12-26 DIAGNOSIS — M86372 Chronic multifocal osteomyelitis, left ankle and foot: Secondary | ICD-10-CM | POA: Diagnosis not present

## 2017-12-26 DIAGNOSIS — L02215 Cutaneous abscess of perineum: Secondary | ICD-10-CM | POA: Diagnosis not present

## 2017-12-26 DIAGNOSIS — I2510 Atherosclerotic heart disease of native coronary artery without angina pectoris: Secondary | ICD-10-CM | POA: Diagnosis not present

## 2017-12-26 DIAGNOSIS — I110 Hypertensive heart disease with heart failure: Secondary | ICD-10-CM | POA: Diagnosis not present

## 2017-12-26 DIAGNOSIS — E1169 Type 2 diabetes mellitus with other specified complication: Secondary | ICD-10-CM | POA: Diagnosis not present

## 2017-12-26 DIAGNOSIS — L97524 Non-pressure chronic ulcer of other part of left foot with necrosis of bone: Secondary | ICD-10-CM | POA: Diagnosis not present

## 2017-12-26 DIAGNOSIS — B9562 Methicillin resistant Staphylococcus aureus infection as the cause of diseases classified elsewhere: Secondary | ICD-10-CM | POA: Diagnosis not present

## 2017-12-26 DIAGNOSIS — L03116 Cellulitis of left lower limb: Secondary | ICD-10-CM | POA: Diagnosis not present

## 2017-12-26 DIAGNOSIS — S91302A Unspecified open wound, left foot, initial encounter: Secondary | ICD-10-CM | POA: Diagnosis not present

## 2017-12-31 ENCOUNTER — Other Ambulatory Visit (HOSPITAL_COMMUNITY)
Admission: RE | Admit: 2017-12-31 | Discharge: 2017-12-31 | Disposition: A | Discharge status: Home / Self Care | Payer: Medicare Other | Source: Other Acute Inpatient Hospital | Attending: Internal Medicine | Admitting: Internal Medicine

## 2017-12-31 DIAGNOSIS — D649 Anemia, unspecified: Secondary | ICD-10-CM | POA: Diagnosis not present

## 2017-12-31 DIAGNOSIS — L97524 Non-pressure chronic ulcer of other part of left foot with necrosis of bone: Secondary | ICD-10-CM | POA: Diagnosis not present

## 2017-12-31 DIAGNOSIS — L89893 Pressure ulcer of other site, stage 3: Secondary | ICD-10-CM | POA: Diagnosis not present

## 2017-12-31 DIAGNOSIS — L97526 Non-pressure chronic ulcer of other part of left foot with bone involvement without evidence of necrosis: Secondary | ICD-10-CM | POA: Diagnosis not present

## 2017-12-31 DIAGNOSIS — E11621 Type 2 diabetes mellitus with foot ulcer: Secondary | ICD-10-CM | POA: Diagnosis not present

## 2017-12-31 DIAGNOSIS — L03116 Cellulitis of left lower limb: Secondary | ICD-10-CM | POA: Diagnosis not present

## 2017-12-31 DIAGNOSIS — E119 Type 2 diabetes mellitus without complications: Secondary | ICD-10-CM | POA: Diagnosis not present

## 2017-12-31 DIAGNOSIS — L02215 Cutaneous abscess of perineum: Secondary | ICD-10-CM | POA: Diagnosis not present

## 2017-12-31 DIAGNOSIS — E1169 Type 2 diabetes mellitus with other specified complication: Secondary | ICD-10-CM | POA: Diagnosis not present

## 2017-12-31 DIAGNOSIS — L89153 Pressure ulcer of sacral region, stage 3: Secondary | ICD-10-CM | POA: Insufficient documentation

## 2017-12-31 DIAGNOSIS — M86372 Chronic multifocal osteomyelitis, left ankle and foot: Secondary | ICD-10-CM | POA: Diagnosis not present

## 2018-01-01 DIAGNOSIS — L89159 Pressure ulcer of sacral region, unspecified stage: Secondary | ICD-10-CM | POA: Diagnosis not present

## 2018-01-01 DIAGNOSIS — G894 Chronic pain syndrome: Secondary | ICD-10-CM | POA: Diagnosis not present

## 2018-01-01 DIAGNOSIS — I4891 Unspecified atrial fibrillation: Secondary | ICD-10-CM | POA: Diagnosis not present

## 2018-01-01 DIAGNOSIS — I10 Essential (primary) hypertension: Secondary | ICD-10-CM | POA: Diagnosis not present

## 2018-01-02 DIAGNOSIS — L97529 Non-pressure chronic ulcer of other part of left foot with unspecified severity: Secondary | ICD-10-CM | POA: Diagnosis not present

## 2018-01-03 DIAGNOSIS — Z79899 Other long term (current) drug therapy: Secondary | ICD-10-CM | POA: Diagnosis not present

## 2018-01-03 DIAGNOSIS — A498 Other bacterial infections of unspecified site: Secondary | ICD-10-CM | POA: Diagnosis not present

## 2018-01-04 LAB — AEROBIC CULTURE W GRAM STAIN (SUPERFICIAL SPECIMEN)

## 2018-01-04 LAB — AEROBIC CULTURE  (SUPERFICIAL SPECIMEN)

## 2018-01-09 DIAGNOSIS — R634 Abnormal weight loss: Secondary | ICD-10-CM | POA: Diagnosis not present

## 2018-01-09 DIAGNOSIS — I6381 Other cerebral infarction due to occlusion or stenosis of small artery: Secondary | ICD-10-CM | POA: Diagnosis not present

## 2018-01-09 DIAGNOSIS — M86179 Other acute osteomyelitis, unspecified ankle and foot: Secondary | ICD-10-CM | POA: Diagnosis not present

## 2018-01-09 DIAGNOSIS — L89159 Pressure ulcer of sacral region, unspecified stage: Secondary | ICD-10-CM | POA: Diagnosis not present

## 2018-01-13 DIAGNOSIS — R1319 Other dysphagia: Secondary | ICD-10-CM | POA: Diagnosis not present

## 2018-01-13 DIAGNOSIS — Z79899 Other long term (current) drug therapy: Secondary | ICD-10-CM | POA: Diagnosis not present

## 2018-01-16 ENCOUNTER — Emergency Department (HOSPITAL_COMMUNITY): Payer: Medicare Other

## 2018-01-16 ENCOUNTER — Emergency Department (HOSPITAL_COMMUNITY)
Admission: EM | Admit: 2018-01-16 | Discharge: 2018-01-16 | Disposition: A | Discharge status: Skilled Nursing Facility | Payer: Medicare Other | Attending: Emergency Medicine | Admitting: Emergency Medicine

## 2018-01-16 ENCOUNTER — Encounter (HOSPITAL_BASED_OUTPATIENT_CLINIC_OR_DEPARTMENT_OTHER): Payer: Medicare Other | Attending: Internal Medicine

## 2018-01-16 ENCOUNTER — Encounter (HOSPITAL_COMMUNITY): Payer: Self-pay | Admitting: Emergency Medicine

## 2018-01-16 ENCOUNTER — Other Ambulatory Visit: Payer: Self-pay

## 2018-01-16 DIAGNOSIS — I2510 Atherosclerotic heart disease of native coronary artery without angina pectoris: Secondary | ICD-10-CM | POA: Diagnosis not present

## 2018-01-16 DIAGNOSIS — Z7902 Long term (current) use of antithrombotics/antiplatelets: Secondary | ICD-10-CM | POA: Diagnosis not present

## 2018-01-16 DIAGNOSIS — M86172 Other acute osteomyelitis, left ankle and foot: Secondary | ICD-10-CM | POA: Diagnosis not present

## 2018-01-16 DIAGNOSIS — Z7984 Long term (current) use of oral hypoglycemic drugs: Secondary | ICD-10-CM | POA: Diagnosis not present

## 2018-01-16 DIAGNOSIS — Z743 Need for continuous supervision: Secondary | ICD-10-CM | POA: Diagnosis not present

## 2018-01-16 DIAGNOSIS — I10 Essential (primary) hypertension: Secondary | ICD-10-CM | POA: Insufficient documentation

## 2018-01-16 DIAGNOSIS — E1140 Type 2 diabetes mellitus with diabetic neuropathy, unspecified: Secondary | ICD-10-CM | POA: Diagnosis not present

## 2018-01-16 DIAGNOSIS — S91302A Unspecified open wound, left foot, initial encounter: Secondary | ICD-10-CM | POA: Diagnosis not present

## 2018-01-16 DIAGNOSIS — L89893 Pressure ulcer of other site, stage 3: Secondary | ICD-10-CM | POA: Insufficient documentation

## 2018-01-16 DIAGNOSIS — E119 Type 2 diabetes mellitus without complications: Secondary | ICD-10-CM | POA: Insufficient documentation

## 2018-01-16 DIAGNOSIS — I252 Old myocardial infarction: Secondary | ICD-10-CM | POA: Diagnosis not present

## 2018-01-16 DIAGNOSIS — Z7401 Bed confinement status: Secondary | ICD-10-CM | POA: Diagnosis not present

## 2018-01-16 DIAGNOSIS — S3130XD Unspecified open wound of scrotum and testes, subsequent encounter: Secondary | ICD-10-CM | POA: Insufficient documentation

## 2018-01-16 DIAGNOSIS — X58XXXA Exposure to other specified factors, initial encounter: Secondary | ICD-10-CM | POA: Insufficient documentation

## 2018-01-16 DIAGNOSIS — Z87891 Personal history of nicotine dependence: Secondary | ICD-10-CM | POA: Insufficient documentation

## 2018-01-16 DIAGNOSIS — M2550 Pain in unspecified joint: Secondary | ICD-10-CM | POA: Diagnosis not present

## 2018-01-16 DIAGNOSIS — N5089 Other specified disorders of the male genital organs: Secondary | ICD-10-CM | POA: Diagnosis not present

## 2018-01-16 DIAGNOSIS — R5381 Other malaise: Secondary | ICD-10-CM | POA: Diagnosis not present

## 2018-01-16 DIAGNOSIS — R0902 Hypoxemia: Secondary | ICD-10-CM | POA: Diagnosis not present

## 2018-01-16 DIAGNOSIS — Z933 Colostomy status: Secondary | ICD-10-CM | POA: Insufficient documentation

## 2018-01-16 DIAGNOSIS — I959 Hypotension, unspecified: Secondary | ICD-10-CM | POA: Diagnosis not present

## 2018-01-16 DIAGNOSIS — Z79899 Other long term (current) drug therapy: Secondary | ICD-10-CM | POA: Diagnosis not present

## 2018-01-16 DIAGNOSIS — Z7982 Long term (current) use of aspirin: Secondary | ICD-10-CM | POA: Insufficient documentation

## 2018-01-16 DIAGNOSIS — S31501A Unspecified open wound of unspecified external genital organs, male, initial encounter: Secondary | ICD-10-CM | POA: Diagnosis not present

## 2018-01-16 DIAGNOSIS — B9562 Methicillin resistant Staphylococcus aureus infection as the cause of diseases classified elsewhere: Secondary | ICD-10-CM | POA: Diagnosis not present

## 2018-01-16 DIAGNOSIS — R404 Transient alteration of awareness: Secondary | ICD-10-CM | POA: Diagnosis not present

## 2018-01-16 DIAGNOSIS — R102 Pelvic and perineal pain: Secondary | ICD-10-CM | POA: Diagnosis not present

## 2018-01-16 DIAGNOSIS — Z8631 Personal history of diabetic foot ulcer: Secondary | ICD-10-CM | POA: Diagnosis not present

## 2018-01-16 DIAGNOSIS — N200 Calculus of kidney: Secondary | ICD-10-CM | POA: Diagnosis not present

## 2018-01-16 DIAGNOSIS — K8020 Calculus of gallbladder without cholecystitis without obstruction: Secondary | ICD-10-CM | POA: Diagnosis not present

## 2018-01-16 LAB — COMPREHENSIVE METABOLIC PANEL
ALK PHOS: 62 U/L (ref 38–126)
ALT: 14 U/L (ref 0–44)
ANION GAP: 11 (ref 5–15)
AST: 16 U/L (ref 15–41)
Albumin: 3.5 g/dL (ref 3.5–5.0)
BUN: 16 mg/dL (ref 6–20)
CALCIUM: 9.4 mg/dL (ref 8.9–10.3)
CO2: 27 mmol/L (ref 22–32)
CREATININE: 1.11 mg/dL (ref 0.61–1.24)
Chloride: 104 mmol/L (ref 98–111)
Glucose, Bld: 102 mg/dL — ABNORMAL HIGH (ref 70–99)
Potassium: 4 mmol/L (ref 3.5–5.1)
Sodium: 142 mmol/L (ref 135–145)
TOTAL PROTEIN: 7.2 g/dL (ref 6.5–8.1)
Total Bilirubin: 0.7 mg/dL (ref 0.3–1.2)

## 2018-01-16 LAB — URINALYSIS, ROUTINE W REFLEX MICROSCOPIC
Bilirubin Urine: NEGATIVE
Glucose, UA: NEGATIVE mg/dL
KETONES UR: NEGATIVE mg/dL
NITRITE: NEGATIVE
PH: 7 (ref 5.0–8.0)
Protein, ur: NEGATIVE mg/dL
SPECIFIC GRAVITY, URINE: 1.028 (ref 1.005–1.030)
WBC, UA: >50 WBC/hpf — ABNORMAL HIGH (ref 0–5)

## 2018-01-16 LAB — CBC WITH DIFFERENTIAL/PLATELET
Abs Immature Granulocytes: 0.02 10*3/uL (ref 0.00–0.07)
BASOS PCT: 1 %
Basophils Absolute: 0 10*3/uL (ref 0.0–0.1)
EOS PCT: 4 %
Eosinophils Absolute: 0.3 10*3/uL (ref 0.0–0.5)
HCT: 34.5 % — ABNORMAL LOW (ref 39.0–52.0)
HEMOGLOBIN: 10.6 g/dL — AB (ref 13.0–17.0)
Immature Granulocytes: 0 %
Lymphocytes Relative: 25 %
Lymphs Abs: 1.6 10*3/uL (ref 0.7–4.0)
MCH: 26.7 pg (ref 26.0–34.0)
MCHC: 30.7 g/dL (ref 30.0–36.0)
MCV: 86.9 fL (ref 80.0–100.0)
MONO ABS: 0.5 10*3/uL (ref 0.1–1.0)
MONOS PCT: 8 %
Neutro Abs: 3.9 10*3/uL (ref 1.7–7.7)
Neutrophils Relative %: 62 %
Platelets: 247 10*3/uL (ref 150–400)
RBC: 3.97 MIL/uL — ABNORMAL LOW (ref 4.22–5.81)
RDW: 14.6 % (ref 11.5–15.5)
WBC: 6.3 10*3/uL (ref 4.0–10.5)
nRBC: 0 % (ref 0.0–0.2)

## 2018-01-16 LAB — I-STAT CG4 LACTIC ACID, ED: Lactic Acid, Venous: 0.55 mmol/L (ref 0.5–1.9)

## 2018-01-16 LAB — LIPASE, BLOOD: LIPASE: 32 U/L (ref 11–51)

## 2018-01-16 MED ORDER — IOPAMIDOL (ISOVUE-300) INJECTION 61%
100.0000 mL | Freq: Once | INTRAVENOUS | Status: AC | PRN
  Administered 2018-01-16: 100 mL via INTRAVENOUS

## 2018-01-16 MED ORDER — MORPHINE SULFATE (PF) 4 MG/ML IV SOLN
4.0000 mg | Freq: Once | INTRAVENOUS | Status: AC
  Administered 2018-01-16: 4 mg via INTRAVENOUS
  Filled 2018-01-16: qty 1

## 2018-01-16 MED ORDER — IOPAMIDOL (ISOVUE-300) INJECTION 61%
INTRAVENOUS | Status: AC
  Filled 2018-01-16: qty 100

## 2018-01-16 MED ORDER — SODIUM CHLORIDE 0.9 % IJ SOLN
INTRAMUSCULAR | Status: AC
  Filled 2018-01-16: qty 50

## 2018-01-16 NOTE — ED Notes (Signed)
Bed: WHALD Expected date:  Expected time:  Means of arrival:  Comments: 

## 2018-01-16 NOTE — ED Provider Notes (Signed)
[facility]-EMERGENCY DEPT Provider Note   CSN: [id number] Arrival date & time: 01/16/18  1428     History   Chief Complaint Chief Complaint  Patient presents with  . Open Wound    HPI Albert Jensen is a 47 y.o. male.  The history is provided by the EMS personnel. The history is limited by a developmental delay. No language interpreter was used.     47 year old male with history of diabetes, cardiac disease, anoxic encephalopathy, nonverbal, brought here via EMS from the wound care center for further evaluation of his scrotal wound.  History is limited as patient is nonverbal, wound care center sent patient here due to wound from "scrotum to rectum".  At this moment, patient does not complain of any pain.  No fever or chills.  He is currently on antibiotic through the PICC line.  Symptom is been ongoing for more than a week.  We were able to communicate with each other based on my questions and patient either giving thumbs up or down.  Otherwise, level 5 caveats  Past Medical History:  Diagnosis Date  . Anoxic encephalopathy (HCC)   . CAD (coronary artery disease)    DES proximal RCA and aspiration thrombectomy mid RCA and PLB 01/2015  . Cardiac arrest [facility])    October 2016 with STEMI  . Essential hypertension   . Respiratory failure (HCC)    Prolonged ventilatory course, complicated by mucus plugging and ultimately required tracheostomy 10-02/2015  . 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 Ladona Ridgel in 2012  . ST elevation myocardial infarction (STEMI) of inferolateral wall [facility])    October 2016   . Type 2 diabetes mellitus (HCC)   . Ventilator associated pneumonia [facility])     Patient Active Problem List   Diagnosis Date Noted  . Acute respiratory failure with hypoxemia (HCC)   . Tracheostomy status (HCC)   . HCAP (healthcare-associated pneumonia)   . Atelectasis   . Encephalopathy   . Acute renal  failure (HCC)   . Urinary tract infection, site not specified   . Type II diabetes mellitus, uncontrolled (HCC)   . Disorientation   . Pressure ulcer 01/31/2015  . Acute respiratory failure (HCC)   . Anoxic encephalopathy (HCC) 01/25/2015  . Acute respiratory failure with hypoxia (HCC) 01/25/2015  . Acute ST elevation myocardial infarction (STEMI) (HCC)   . Respiratory failure (HCC)   . Cardiac arrest (HCC) 01/22/2015  . Acute MI, inferolateral wall, initial episode of care (HCC)   . ESSENTIAL HYPERTENSION, BENIGN 05/25/2010  . VENTRICULAR TACHYCARDIA, PAROXYSMAL 05/25/2010    Past Surgical History:  Procedure Laterality Date  . CARDIAC CATHETERIZATION N/A 01/22/2015   Procedure: Left Heart Cath and Coronary Angiography;  Surgeon: Corky Crafts, MD;  Location: [facility] INVASIVE CV LAB;  Service: Cardiovascular;  Laterality: N/A;  . CARDIAC CATHETERIZATION N/A 01/22/2015   Procedure: Coronary Stent Intervention;  Surgeon: Corky Crafts, MD;  Location: [facility] INVASIVE CV LAB;  Service: Cardiovascular;  Laterality: N/A;  prox rca        Home Medications    Prior to Admission medications   Medication Sig Start Date End Date Taking? Authorizing Provider  acetaminophen (TYLENOL) 325 MG tablet Take 650 mg by mouth every 4 (four) hours as needed for mild pain.    [provider]  acidophilus (RISAQUAD) CAPS capsule Take 1 capsule by mouth daily.    [provider]  Amino Acids-Protein Hydrolys (FEEDING  SUPPLEMENT, PRO-STAT SUGAR FREE 64,) LIQD Take 30 mLs by mouth daily.    [provider]  aspirin 81 MG chewable tablet Place 1 tablet (81 mg total) into feeding tube daily. 02/08/15   Jeanella Craze, NP  atorvastatin (LIPITOR) 80 MG tablet Place 1 tablet (80 mg total) into feeding tube daily at 6 PM. 02/08/15   Jeanella Craze, NP  cholecalciferol (VITAMIN D) 1000 units tablet Take 2,000 Units by mouth daily.    [provider]  clopidogrel (PLAVIX) 75  MG tablet 75 mg daily.    [provider]  Dextrose-Sodium Chloride (DEXTROSE 5 % AND 0.45% NACL) infusion Inject 40 mL/hr into the vein continuous. 02/08/15   Jeanella Craze, NP  doxycycline (VIBRAMYCIN) 100 MG capsule Take 1 capsule (100 mg total) by mouth 2 (two) times daily. 10/20/17   Donnetta Hutching, MD  enalapril (VASOTEC) 2.5 MG tablet Take 2.5 mg by mouth daily.    [provider]  famotidine (PEPCID) 20 MG tablet Take 20 mg by mouth daily.    [provider]  fentaNYL (DURAGESIC - DOSED MCG/HR) 50 MCG/HR Place 50 mcg onto the skin every 3 (three) days.    [provider]  HYDROcodone-acetaminophen (NORCO) 10-325 MG tablet Take 1 tablet by mouth every 6 (six) hours as needed for moderate pain.     [provider]  ketoconazole (NIZORAL) 2 % shampoo Apply 1 application topically 2 (two) times a week. mon/thurs    [provider]  Melatonin 3 MG TABS 1 tablet at bedtime.    [provider]  metFORMIN (GLUCOPHAGE) 500 MG tablet Take 1 tablet by mouth 2 (two) times daily.    [provider]  mirtazapine (REMERON) 15 MG tablet 1 tablet at bedtime.    [provider]  Multiple Vitamins-Minerals (THERA-M) TABS 1 tablet daily.    [provider]  Nutritional Supplements (FEEDING SUPPLEMENT, GLUCERNA 1.2 CAL,) LIQD Place 80 mL/hr into feeding tube continuous. 02/08/15   Jeanella Craze, NP  rOPINIRole (REQUIP) 1 MG tablet Take 1 mg by mouth at bedtime.     [provider]  sennosides-docusate sodium (SENOKOT-S) 8.6-50 MG tablet Take 1 tablet by mouth daily.    [provider]  sertraline (ZOLOFT) 25 MG tablet Take 25 mg by mouth daily.    [provider]  sulfamethoxazole-trimethoprim (BACTRIM DS,SEPTRA DS) 800-160 MG tablet Take 1 tablet by mouth 2 (two) times daily. Starting 10/16/2017 @@ 2000 x 10 days.    [provider]  trolamine salicylate (ASPERCREME) 10 % cream Apply 1  application topically 2 (two) times daily.    [provider]  vitamin C (ASCORBIC ACID) 500 MG tablet 1 tablet 2 (two) times daily.    [provider]    Family History Family History  Problem Relation Age of Onset  . Heart disease Mother 12       MI  . Diabetes Father     Social History Social History   Tobacco Use  . Smoking status: Former Smoker    Last attempt to quit: 05/01/2005    Years since quitting: 12.7  . Smokeless tobacco: Never Used  Substance Use Topics  . Alcohol use: Yes    Alcohol/week: 1.0 standard drinks    Types: 1 Cans of beer per week  . Drug use: Not on file     Allergies   Patient has no known allergies.   Review of Systems Review of Systems  Unable to perform ROS: Patient nonverbal     Physical Exam Updated Vital Signs BP 106/75   Pulse 86   Temp 98.8 F (37.1 C)   Resp 18   SpO2 92%   Physical Exam  Constitutional: He appears well-developed and well-nourished. No distress.  Chronically ill-appearing in no acute discomfort.  HENT:  Head: Atraumatic.  Eyes: Conjunctivae are normal.  Neck: Neck supple.  Cardiovascular: Normal rate and regular rhythm.  Pulmonary/Chest: Effort normal and breath sounds normal.  Abdominal: Soft. There is no tenderness.  Genitourinary:  Genitourinary Comments: Chaperone present during exam.  On the most inferior aspect of scrotum there is an open defect approximately 1 cm in diameter with surrounding induration, extending towards the perineum and rectum.  It has been surgically open with packing in place but it is tender to palpation with surrounding skin erythema.  Penis is circumcised without lesions rash, or tenderness.  Neurological: He is alert.  Skin: No rash noted.  Psychiatric: He has a normal mood and affect.  Nursing note and vitals reviewed.    ED Treatments / Results  Labs (all labs ordered are listed, but only abnormal results are displayed) Labs Reviewed  CBC WITH  DIFFERENTIAL/PLATELET - Abnormal; Notable for the following components:      Result Value   RBC 3.97 (*)    Hemoglobin 10.6 (*)    HCT 34.5 (*)    All other components within normal limits  COMPREHENSIVE METABOLIC PANEL - Abnormal; Notable for the following components:   Glucose, Bld 102 (*)    All other components within normal limits  LIPASE, BLOOD  URINALYSIS, ROUTINE W REFLEX MICROSCOPIC  I-STAT CG4 LACTIC ACID, ED    EKG None  Radiology Ct Abdomen Pelvis W Contrast  Result Date: 01/16/2018 EXAM: CT ABDOMEN AND PELVIS WITH CONTRAST TECHNIQUE: Multidetector CT imaging of the abdomen and pelvis was performed using the standard protocol following bolus administration of intravenous contrast. CONTRAST:  ISOVUE-300 IOPAMIDOL (ISOVUE-300) INJECTION 61% COMPARISON:  None. FINDINGS: Lower chest: Minimal left pleural effusion.  No acute findings. Hepatobiliary: Liver normal in size attenuation. No mass or focal lesion. Small dependent gallstone. Gallbladder otherwise unremarkable. No bile duct dilation. Pancreas: Unremarkable. No pancreatic ductal dilatation or surrounding inflammatory changes. Spleen: Mild enlargement of the spleen measuring 15 cm in greatest dimension. No splenic mass or focal lesion. Adrenals/Urinary Tract: No adrenal masses. Kidneys are normal size, orientation and position with symmetric enhancement. There are small nonobstructing stones in each kidney. No renal masses. Mild bilateral perinephric stranding. No hydronephrosis. Ureters are normal in course and in caliber.  No ureteral stones. Bladder is decompressed with a suprapubic catheter. Stomach/Bowel: Stomach is unremarkable. Small bowel is normal in caliber. No wall thickening or inflammation. Colon is normal in caliber. There is a left lower quadrant colostomy. A Hartmann's pouch anastomosis lies in the left anterior lower pelvis. No colon wall thickening or inflammation. There are scattered left colon diverticula.  Normal appendix visualized. Vascular/Lymphatic: Mild distal aortic atherosclerosis. No aneurysm. No adenopathy. Reproductive: Prostate normal in size. Other: Decubitus ulcer overlies the lower sacrum. No evidence of an abscess. No ascites. Musculoskeletal: Distal aspect of the sacrum and the coccyx have been resected. There is no bone resorption to suggest acute osteomyelitis. No acute fracture. No osteoblastic or osteolytic lesions. IMPRESSION: 1. Decubitus ulcer overlies the lower sacrum. There has been previous resection of the distal sacrum and coccyx. No evidence of an abscess or of acute osteomyelitis. 2. No acute findings within  the abdomen or pelvis. 3. Mild splenomegaly. 4. Small dependent gallstone. 5. Small nonobstructing stones in each kidney.  No hydronephrosis. 6. Mild distal aortic atherosclerosis. 7. Changes from previous colon surgery with a left lower quadrant colostomy. No bowel inflammation or obstruction. Electronically Signed   By: Amie Portland M.D.   On: 01/16/2018 17:28    Procedures Procedures (including critical care time)  Medications Ordered in ED Medications  iopamidol (ISOVUE-300) 61 % injection (has no administration in time range)  sodium chloride 0.9 % injection (has no administration in time range)  morphine 4 MG/ML injection 4 mg (4 mg Intravenous Given 01/16/18 1607)  iopamidol (ISOVUE-300) 61 % injection 100 mL (100 mLs Intravenous Contrast Given 01/16/18 1650)     Initial Impression / Assessment and Plan / ED Course  I have reviewed the triage vital signs and the nursing notes.  Pertinent labs & imaging results that were available during my care of the patient were reviewed by me and considered in my medical decision making (see chart for details).     BP 119/84   Pulse 89   Temp 98.8 F (37.1 C)   Resp 16   SpO2 97%    Final Clinical Impressions(s) / ED Diagnoses   Final diagnoses:  Scrotal ulcer    ED Discharge Orders    None     3:55  PM Patient sent here from the wound care center due to having an open wound from his scrotum extending towards his rectum.  Exam is difficult due to location however patient does have an open wound with packing in place with surrounding tenderness and erythema.  Will obtain abdominal pelvic CT scan to assess for the wound infection and to rule out Fournier's gangrene although my suspicion is low.  5:40 PM Patient is afebrile, vital signs stable, normal lactic acid, normal WBC, mild anemia with hemoglobin of 10.6, near baseline, electro lites panels are reassuring, normal lipase, and abdominal pelvis CT scan obtained demonstrate decubitus ulcer overlying the lower sacrum.  There is been previous resections of the distal sacrum and coccyx without any evidence of abscess or acute osteomyelitis.  No other acute finding were noted.  I did attempt to reach out to [facility] wound care [facility] to speak with a provider but was unsuccessful.  At this point, I felt patient is stable for discharge back to his facility to continue with his current treatment.  5:47 PM Attempted to contact nursing staff at [facility] [phone] without success.  Will d/c pt back to facility. I did discussed the finding with pt.   Fayrene Helper, PA-C 01/16/18 1756    Tilden Fossa, MD 01/17/18 1537

## 2018-01-16 NOTE — ED Triage Notes (Signed)
Patient BIB [facility] from [facility] wound care center. Patient was transported to wound care center by [facility] from [facility] assisted living. Patient was sent from Wound Center for wound from "scrotum to rectum."

## 2018-01-16 NOTE — Discharge Instructions (Signed)
Your blood work today is normal.  You have normal white blood cell.  Your abdominal and pelvis CT did not show any pocket of infection or bone infection.  Your doctor will need to reevaluate you at your facility.

## 2018-01-16 NOTE — ED Notes (Signed)
[facility] contacted for transport back to [facility]

## 2018-01-16 NOTE — ED Notes (Signed)
Bed: WA17 Expected date:  Expected time:  Means of arrival:  Comments: [facility]  

## 2018-01-17 DIAGNOSIS — Z79899 Other long term (current) drug therapy: Secondary | ICD-10-CM | POA: Diagnosis not present

## 2018-01-20 DIAGNOSIS — E119 Type 2 diabetes mellitus without complications: Secondary | ICD-10-CM | POA: Diagnosis not present

## 2018-01-20 DIAGNOSIS — L89159 Pressure ulcer of sacral region, unspecified stage: Secondary | ICD-10-CM | POA: Diagnosis not present

## 2018-01-20 DIAGNOSIS — B9562 Methicillin resistant Staphylococcus aureus infection as the cause of diseases classified elsewhere: Secondary | ICD-10-CM | POA: Diagnosis not present

## 2018-01-20 DIAGNOSIS — N319 Neuromuscular dysfunction of bladder, unspecified: Secondary | ICD-10-CM | POA: Diagnosis not present

## 2018-01-20 DIAGNOSIS — Z79899 Other long term (current) drug therapy: Secondary | ICD-10-CM | POA: Diagnosis not present

## 2018-01-23 DIAGNOSIS — D649 Anemia, unspecified: Secondary | ICD-10-CM | POA: Diagnosis not present

## 2018-01-23 DIAGNOSIS — Z79899 Other long term (current) drug therapy: Secondary | ICD-10-CM | POA: Diagnosis not present

## 2018-01-29 DIAGNOSIS — Z79899 Other long term (current) drug therapy: Secondary | ICD-10-CM | POA: Diagnosis not present

## 2018-02-04 DIAGNOSIS — D688 Other specified coagulation defects: Secondary | ICD-10-CM | POA: Diagnosis not present

## 2018-02-04 DIAGNOSIS — Z79899 Other long term (current) drug therapy: Secondary | ICD-10-CM | POA: Diagnosis not present

## 2018-02-05 DIAGNOSIS — B9562 Methicillin resistant Staphylococcus aureus infection as the cause of diseases classified elsewhere: Secondary | ICD-10-CM | POA: Diagnosis not present

## 2018-02-05 DIAGNOSIS — L89159 Pressure ulcer of sacral region, unspecified stage: Secondary | ICD-10-CM | POA: Diagnosis not present

## 2018-02-05 DIAGNOSIS — N319 Neuromuscular dysfunction of bladder, unspecified: Secondary | ICD-10-CM | POA: Diagnosis not present

## 2018-02-05 DIAGNOSIS — E119 Type 2 diabetes mellitus without complications: Secondary | ICD-10-CM | POA: Diagnosis not present

## 2018-02-06 DIAGNOSIS — I2510 Atherosclerotic heart disease of native coronary artery without angina pectoris: Secondary | ICD-10-CM | POA: Diagnosis not present

## 2018-02-06 DIAGNOSIS — I252 Old myocardial infarction: Secondary | ICD-10-CM | POA: Diagnosis not present

## 2018-02-06 DIAGNOSIS — I10 Essential (primary) hypertension: Secondary | ICD-10-CM | POA: Diagnosis not present

## 2018-02-06 DIAGNOSIS — Z8631 Personal history of diabetic foot ulcer: Secondary | ICD-10-CM | POA: Diagnosis not present

## 2018-02-06 DIAGNOSIS — Z933 Colostomy status: Secondary | ICD-10-CM | POA: Diagnosis not present

## 2018-02-06 DIAGNOSIS — S31501A Unspecified open wound of unspecified external genital organs, male, initial encounter: Secondary | ICD-10-CM | POA: Diagnosis not present

## 2018-02-06 DIAGNOSIS — L89893 Pressure ulcer of other site, stage 3: Secondary | ICD-10-CM | POA: Diagnosis not present

## 2018-02-10 DIAGNOSIS — Z79899 Other long term (current) drug therapy: Secondary | ICD-10-CM | POA: Diagnosis not present

## 2018-02-14 DIAGNOSIS — Z79899 Other long term (current) drug therapy: Secondary | ICD-10-CM | POA: Diagnosis not present

## 2018-02-19 DIAGNOSIS — Z79899 Other long term (current) drug therapy: Secondary | ICD-10-CM | POA: Diagnosis not present

## 2018-02-20 ENCOUNTER — Encounter (HOSPITAL_BASED_OUTPATIENT_CLINIC_OR_DEPARTMENT_OTHER): Payer: Medicare Other | Attending: Internal Medicine

## 2018-02-20 DIAGNOSIS — I10 Essential (primary) hypertension: Secondary | ICD-10-CM | POA: Insufficient documentation

## 2018-02-20 DIAGNOSIS — M2550 Pain in unspecified joint: Secondary | ICD-10-CM | POA: Diagnosis not present

## 2018-02-20 DIAGNOSIS — L89899 Pressure ulcer of other site, unspecified stage: Secondary | ICD-10-CM | POA: Diagnosis not present

## 2018-02-20 DIAGNOSIS — Z7401 Bed confinement status: Secondary | ICD-10-CM | POA: Diagnosis not present

## 2018-02-20 DIAGNOSIS — L89893 Pressure ulcer of other site, stage 3: Secondary | ICD-10-CM | POA: Insufficient documentation

## 2018-02-20 DIAGNOSIS — E1140 Type 2 diabetes mellitus with diabetic neuropathy, unspecified: Secondary | ICD-10-CM | POA: Diagnosis not present

## 2018-02-20 DIAGNOSIS — I252 Old myocardial infarction: Secondary | ICD-10-CM | POA: Insufficient documentation

## 2018-02-20 DIAGNOSIS — I2510 Atherosclerotic heart disease of native coronary artery without angina pectoris: Secondary | ICD-10-CM | POA: Insufficient documentation

## 2018-02-20 DIAGNOSIS — Z933 Colostomy status: Secondary | ICD-10-CM | POA: Diagnosis not present

## 2018-03-11 ENCOUNTER — Encounter (HOSPITAL_COMMUNITY): Payer: Self-pay | Admitting: *Deleted

## 2018-03-11 ENCOUNTER — Other Ambulatory Visit: Payer: Self-pay

## 2018-03-11 ENCOUNTER — Emergency Department (HOSPITAL_COMMUNITY)
Admission: EM | Admit: 2018-03-11 | Discharge: 2018-03-12 | Disposition: A | Discharge status: Home / Self Care | Payer: Medicare Other | Attending: Emergency Medicine | Admitting: Emergency Medicine

## 2018-03-11 DIAGNOSIS — I10 Essential (primary) hypertension: Secondary | ICD-10-CM | POA: Diagnosis not present

## 2018-03-11 DIAGNOSIS — E119 Type 2 diabetes mellitus without complications: Secondary | ICD-10-CM | POA: Diagnosis not present

## 2018-03-11 DIAGNOSIS — T839XXA Unspecified complication of genitourinary prosthetic device, implant and graft, initial encounter: Secondary | ICD-10-CM | POA: Diagnosis not present

## 2018-03-11 DIAGNOSIS — Y828 Other medical devices associated with adverse incidents: Secondary | ICD-10-CM | POA: Diagnosis not present

## 2018-03-11 DIAGNOSIS — T83091A Other mechanical complication of indwelling urethral catheter, initial encounter: Secondary | ICD-10-CM | POA: Diagnosis not present

## 2018-03-11 DIAGNOSIS — Z7902 Long term (current) use of antithrombotics/antiplatelets: Secondary | ICD-10-CM | POA: Insufficient documentation

## 2018-03-11 DIAGNOSIS — G9349 Other encephalopathy: Secondary | ICD-10-CM | POA: Insufficient documentation

## 2018-03-11 DIAGNOSIS — I2510 Atherosclerotic heart disease of native coronary artery without angina pectoris: Secondary | ICD-10-CM | POA: Insufficient documentation

## 2018-03-11 DIAGNOSIS — Z79899 Other long term (current) drug therapy: Secondary | ICD-10-CM | POA: Diagnosis not present

## 2018-03-11 DIAGNOSIS — R404 Transient alteration of awareness: Secondary | ICD-10-CM | POA: Diagnosis not present

## 2018-03-11 DIAGNOSIS — Z7984 Long term (current) use of oral hypoglycemic drugs: Secondary | ICD-10-CM | POA: Insufficient documentation

## 2018-03-11 DIAGNOSIS — Z87891 Personal history of nicotine dependence: Secondary | ICD-10-CM | POA: Insufficient documentation

## 2018-03-11 DIAGNOSIS — I252 Old myocardial infarction: Secondary | ICD-10-CM | POA: Diagnosis not present

## 2018-03-11 NOTE — ED Provider Notes (Signed)
[facility] EMERGENCY DEPARTMENT Provider Note   CSN: [id number] Arrival date & time: 03/11/18  2247     History   Chief Complaint Chief Complaint  Patient presents with  . suprapubic catheter pulled out   Level 5 caveat due to patient nonverbal HPI Albert Jensen is a 47 y.o. male.  History provided by: nursing home paperwork. The history is limited by the condition of the patient.  Patient presents from nursing facility for accidental removal of suprapubic catheter.  Patient has a history of anoxic encephalopathy and is nonverbal at baseline.  He has a suprapubic catheter in place.  Apparently it was actually pulled out earlier in the night.  No other details known at this time.  Past Medical History:  Diagnosis Date  . Anoxic encephalopathy (HCC)   . CAD (coronary artery disease)    DES proximal RCA and aspiration thrombectomy mid RCA and PLB 01/2015  . Cardiac arrest [facility](HCC)    October 2016 with STEMI  . Essential hypertension   . Respiratory failure (HCC)    Prolonged ventilatory course, complicated by mucus plugging and ultimately required tracheostomy 10-02/2015  . 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 Ladona Ridgelaylor in 2012  . ST elevation myocardial infarction (STEMI) of inferolateral wall [facility](HCC)    October 2016   . Type 2 diabetes mellitus (HCC)   . Ventilator associated pneumonia [facility](HCC)     Patient Active Problem List   Diagnosis Date Noted  . Acute respiratory failure with hypoxemia (HCC)   . Tracheostomy status (HCC)   . HCAP (healthcare-associated pneumonia)   . Atelectasis   . Encephalopathy   . Acute renal failure (HCC)   . Urinary tract infection, site not specified   . Type II diabetes mellitus, uncontrolled (HCC)   . Disorientation   . Pressure ulcer 01/31/2015  . Acute respiratory failure (HCC)   . Anoxic encephalopathy (HCC) 01/25/2015  . Acute respiratory failure with hypoxia (HCC) 01/25/2015  . Acute  ST elevation myocardial infarction (STEMI) (HCC)   . Respiratory failure (HCC)   . Cardiac arrest (HCC) 01/22/2015  . Acute MI, inferolateral wall, initial episode of care (HCC)   . ESSENTIAL HYPERTENSION, BENIGN 05/25/2010  . VENTRICULAR TACHYCARDIA, PAROXYSMAL 05/25/2010    Past Surgical History:  Procedure Laterality Date  . CARDIAC CATHETERIZATION N/A 01/22/2015   Procedure: Left Heart Cath and Coronary Angiography;  Surgeon: Corky CraftsJayadeep S Varanasi, MD;  [facility];  Service: Cardiovascular;  Laterality: N/A;  . CARDIAC CATHETERIZATION N/A 01/22/2015   Procedure: Coronary Stent Intervention;  Surgeon: Corky CraftsJayadeep S Varanasi, MD;  [facility];  Service: Cardiovascular;  Laterality: N/A;  prox rca        Home Medications    Prior to Admission medications   Medication Sig Start Date End Date Taking? Authorizing Provider  acetaminophen (TYLENOL) 325 MG tablet Take 650 mg by mouth every 4 (four) hours as needed for mild pain.    [provider]  Acidophilus Lactobacillus CAPS Take 1 capsule by mouth daily.    [provider]  Amino Acids-Protein Hydrolys (FEEDING SUPPLEMENT, PRO-STAT SUGAR FREE 64,) LIQD Take 30 mLs by mouth 2 (two) times daily.     [provider]  Cholecalciferol (VITAMIN D3) 2000 units TABS Take 2,000 Units by mouth daily.    [provider]  clopidogrel (PLAVIX) 75 MG tablet Take 75 mg by mouth daily.     [provider]  enalapril (VASOTEC) 2.5 MG tablet Take 2.5 mg by mouth 2 (two) times daily.     [provider]  famotidine (PEPCID) 20 MG tablet Take 20 mg by mouth daily.    [provider]  heparin flush 10 UNIT/ML SOLN injection Inject 5 mLs into the vein 3 (three) times daily.    [provider]  HYDROcodone-acetaminophen (NORCO) 10-325 MG tablet Take 1 tablet by mouth every 6 (six) hours as needed for moderate pain.     [provider]  ketoconazole  (NIZORAL) 2 % shampoo Apply 1 application topically 2 (two) times a week. On Tuesday and Friday    [provider]  Melatonin 3 MG TABS Take 1 tablet by mouth at bedtime.     [provider]  Menthol-Zinc Oxide 0.44-20.6 % OINT Apply 1 application topically 3 (three) times daily. Apply to buttocks and scrotum every shift for protection    [provider]  metFORMIN (GLUCOPHAGE) 500 MG tablet Take 1 tablet by mouth 2 (two) times daily.    [provider]  methadone (DOLOPHINE) 5 MG tablet Take 5 mg by mouth every 8 (eight) hours. 01/10/18   [provider]  mirtazapine (REMERON) 7.5 MG tablet Take 7.5 mg by mouth at bedtime.    [provider]  Multiple Vitamins-Minerals (THERA-M) TABS Take 1 tablet by mouth daily.     [provider]  ondansetron (ZOFRAN-ODT) 4 MG disintegrating tablet Take 4 mg by mouth every 4 (four) hours as needed for nausea or vomiting.    [provider]  rOPINIRole (REQUIP) 1 MG tablet Take 1 mg by mouth at bedtime.     [provider]  sennosides-docusate sodium (SENOKOT-S) 8.6-50 MG tablet Take 1 tablet by mouth daily.    [provider]  sertraline (ZOLOFT) 25 MG tablet Take 25 mg by mouth daily.    [provider]  Sodium Chloride Flush (NORMAL SALINE FLUSH) 0.9 % SOLN Inject 10 mLs into the vein 3 (three) times daily.    [provider]  trolamine salicylate (ASPERCREME) 10 % cream Apply 1 application topically 2 (two) times daily.    [provider]  vancomycin (VANCOCIN) 1 g injection 1 g by Other route every 12 (twelve) hours. Give 1 gm via IV every 12 hours for 6 weeks until 02/22/2018 01/14/18   [provider]  vitamin C (ASCORBIC ACID) 500 MG tablet Take 1 tablet by mouth 2 (two) times daily.     [provider]    Family History Family History  Problem Relation Age of Onset  . Heart disease Mother 37       MI  . Diabetes Father      Social History Social History   Tobacco Use  . Smoking status: Former Smoker    Last attempt to quit: 05/01/2005    Years since quitting: 12.8  . Smokeless tobacco: Never Used  Substance Use Topics  . Alcohol use: Yes    Alcohol/week: 1.0 standard drinks    Types: 1 Cans of beer per week  . Drug use: Not on file     Allergies   Patient has no known allergies.   Review of Systems Review of Systems  Unable to perform ROS: Patient nonverbal     Physical Exam Updated Vital Signs BP 105/73 (BP Location: Left Arm)   Pulse 87   Temp 98.7 F (37.1 C) (Oral)   Resp 18   SpO2 95%   Physical  Exam CONSTITUTIONAL: Chronically ill-appearing HEAD: Normocephalic/atraumatic EYES: EOMI ENMT: Mucous membranes moist NECK: supple no meningeal signs CV: S1/S2 noted, no murmurs/rubs/gallops noted LUNGS: Lungs are clear to auscultation bilaterally, no apparent distress ABDOMEN: soft, nontender,colostomy bag in place GU: Suprapubic stoma noted, no active bleeding.  No erythema, no bruising, noted to penis or scrotum.  Bandage noted to sacrum.  Nurse present for exam NEURO: Pt is awake/alert, he is nonverbal but responds to questing with thumbs up EXTREMITIES: pulses normal/equal  SKIN: warm, color normal PSYCH: unable to assess  ED Treatments / Results  Labs (all labs ordered are listed, but only abnormal results are displayed) Labs Reviewed - No data to display  EKG None  Radiology No results found.  Procedures BLADDER CATHETERIZATION Date/Time: 03/12/2018 12:17 AM Performed by: Zadie Rhine, MD Authorized by: Zadie Rhine, MD   Consent:    Consent obtained:  Verbal Pre-procedure details:    Procedure purpose:  Therapeutic   Preparation: Patient was prepped and draped in usual sterile fashion   Anesthesia (see MAR for exact dosages):    Anesthesia method:  None Procedure details:    Provider performed due to:  Nurse unable to complete   Catheter  insertion:  Indwelling   Catheter type:  Foley   Catheter size:  14 Fr   Bladder irrigation: no     Number of attempts:  1   Urine characteristics:  Yellow Post-procedure details:    Patient tolerance of procedure:  Tolerated well, no immediate complications    Medications Ordered in ED Medications - No data to display   Initial Impression / Assessment and Plan / ED Course  I have reviewed the triage vital signs and the nursing notes.      11:41 PM Attempted to call [facility] his nursing facility, but no one was available.  Will attempt to replace suprapubic catheter 12:20 AM  I was able to place a 14 French Foley catheter in the suprapubic stoma without difficulty.  Urine started to flow.  Patient tolerated well.  Will discharge back to facility. Final Clinical Impressions(s) / ED Diagnoses   Final diagnoses:  Foley catheter problem, initial encounter [facility])    ED Discharge Orders    None       Zadie Rhine, MD 03/12/18 0020

## 2018-03-11 NOTE — ED Triage Notes (Signed)
Pt brought in by [facility] for c/o pt accidentally pulled out suprapubic catheter; [facility]; pt is non-verbal from a CVA but able to communicate with a thumbs up with yes/no questions

## 2018-03-12 DIAGNOSIS — I959 Hypotension, unspecified: Secondary | ICD-10-CM | POA: Diagnosis not present

## 2018-03-12 DIAGNOSIS — Z7401 Bed confinement status: Secondary | ICD-10-CM | POA: Diagnosis not present

## 2018-03-12 DIAGNOSIS — R0902 Hypoxemia: Secondary | ICD-10-CM | POA: Diagnosis not present

## 2018-03-12 NOTE — ED Notes (Signed)
Pt unable to sign and report was [facility] to [facility].

## 2018-03-13 ENCOUNTER — Encounter (HOSPITAL_BASED_OUTPATIENT_CLINIC_OR_DEPARTMENT_OTHER): Payer: Medicare Other | Attending: Internal Medicine

## 2018-03-13 DIAGNOSIS — S91105A Unspecified open wound of left lesser toe(s) without damage to nail, initial encounter: Secondary | ICD-10-CM | POA: Diagnosis not present

## 2018-03-13 DIAGNOSIS — E1140 Type 2 diabetes mellitus with diabetic neuropathy, unspecified: Secondary | ICD-10-CM | POA: Insufficient documentation

## 2018-03-13 DIAGNOSIS — I252 Old myocardial infarction: Secondary | ICD-10-CM | POA: Insufficient documentation

## 2018-03-13 DIAGNOSIS — T83098D Other mechanical complication of other indwelling urethral catheter, subsequent encounter: Secondary | ICD-10-CM | POA: Diagnosis not present

## 2018-03-13 DIAGNOSIS — L89899 Pressure ulcer of other site, unspecified stage: Secondary | ICD-10-CM | POA: Diagnosis not present

## 2018-03-13 DIAGNOSIS — I2510 Atherosclerotic heart disease of native coronary artery without angina pectoris: Secondary | ICD-10-CM | POA: Diagnosis not present

## 2018-03-13 DIAGNOSIS — L97521 Non-pressure chronic ulcer of other part of left foot limited to breakdown of skin: Secondary | ICD-10-CM | POA: Insufficient documentation

## 2018-03-13 DIAGNOSIS — I6389 Other cerebral infarction: Secondary | ICD-10-CM | POA: Diagnosis not present

## 2018-03-13 DIAGNOSIS — L89893 Pressure ulcer of other site, stage 3: Secondary | ICD-10-CM | POA: Insufficient documentation

## 2018-03-13 DIAGNOSIS — I10 Essential (primary) hypertension: Secondary | ICD-10-CM | POA: Insufficient documentation

## 2018-03-13 DIAGNOSIS — E11621 Type 2 diabetes mellitus with foot ulcer: Secondary | ICD-10-CM | POA: Insufficient documentation

## 2018-03-13 DIAGNOSIS — L89159 Pressure ulcer of sacral region, unspecified stage: Secondary | ICD-10-CM | POA: Diagnosis not present

## 2018-03-13 DIAGNOSIS — M86179 Other acute osteomyelitis, unspecified ankle and foot: Secondary | ICD-10-CM | POA: Diagnosis not present

## 2018-04-01 DIAGNOSIS — A498 Other bacterial infections of unspecified site: Secondary | ICD-10-CM | POA: Diagnosis not present

## 2018-04-03 DIAGNOSIS — R1111 Vomiting without nausea: Secondary | ICD-10-CM | POA: Diagnosis not present

## 2018-04-03 DIAGNOSIS — Z743 Need for continuous supervision: Secondary | ICD-10-CM | POA: Diagnosis not present

## 2018-04-03 DIAGNOSIS — R110 Nausea: Secondary | ICD-10-CM | POA: Diagnosis not present

## 2018-04-03 DIAGNOSIS — R29898 Other symptoms and signs involving the musculoskeletal system: Secondary | ICD-10-CM | POA: Diagnosis not present

## 2018-04-07 DIAGNOSIS — I6389 Other cerebral infarction: Secondary | ICD-10-CM | POA: Diagnosis not present

## 2018-04-07 DIAGNOSIS — M86179 Other acute osteomyelitis, unspecified ankle and foot: Secondary | ICD-10-CM | POA: Diagnosis not present

## 2018-04-07 DIAGNOSIS — R634 Abnormal weight loss: Secondary | ICD-10-CM | POA: Diagnosis not present

## 2018-04-07 DIAGNOSIS — L89159 Pressure ulcer of sacral region, unspecified stage: Secondary | ICD-10-CM | POA: Diagnosis not present

## 2018-04-13 DIAGNOSIS — E7800 Pure hypercholesterolemia, unspecified: Secondary | ICD-10-CM | POA: Diagnosis not present

## 2018-04-15 DIAGNOSIS — H2513 Age-related nuclear cataract, bilateral: Secondary | ICD-10-CM | POA: Diagnosis not present

## 2018-04-15 DIAGNOSIS — Z7984 Long term (current) use of oral hypoglycemic drugs: Secondary | ICD-10-CM | POA: Diagnosis not present

## 2018-04-15 DIAGNOSIS — E119 Type 2 diabetes mellitus without complications: Secondary | ICD-10-CM | POA: Diagnosis not present

## 2018-04-21 ENCOUNTER — Encounter (HOSPITAL_BASED_OUTPATIENT_CLINIC_OR_DEPARTMENT_OTHER): Payer: Medicare Other | Attending: Internal Medicine

## 2018-04-21 DIAGNOSIS — L98492 Non-pressure chronic ulcer of skin of other sites with fat layer exposed: Secondary | ICD-10-CM | POA: Insufficient documentation

## 2018-04-21 DIAGNOSIS — E1140 Type 2 diabetes mellitus with diabetic neuropathy, unspecified: Secondary | ICD-10-CM | POA: Insufficient documentation

## 2018-04-21 DIAGNOSIS — L97521 Non-pressure chronic ulcer of other part of left foot limited to breakdown of skin: Secondary | ICD-10-CM | POA: Insufficient documentation

## 2018-04-21 DIAGNOSIS — I110 Hypertensive heart disease with heart failure: Secondary | ICD-10-CM | POA: Insufficient documentation

## 2018-04-21 DIAGNOSIS — R0902 Hypoxemia: Secondary | ICD-10-CM | POA: Diagnosis not present

## 2018-04-21 DIAGNOSIS — I252 Old myocardial infarction: Secondary | ICD-10-CM | POA: Insufficient documentation

## 2018-04-21 DIAGNOSIS — I509 Heart failure, unspecified: Secondary | ICD-10-CM | POA: Diagnosis not present

## 2018-04-21 DIAGNOSIS — L03032 Cellulitis of left toe: Secondary | ICD-10-CM | POA: Insufficient documentation

## 2018-04-21 DIAGNOSIS — L8995 Pressure ulcer of unspecified site, unstageable: Secondary | ICD-10-CM | POA: Diagnosis not present

## 2018-04-21 DIAGNOSIS — E11621 Type 2 diabetes mellitus with foot ulcer: Secondary | ICD-10-CM | POA: Diagnosis not present

## 2018-04-21 DIAGNOSIS — I959 Hypotension, unspecified: Secondary | ICD-10-CM | POA: Diagnosis not present

## 2018-04-21 DIAGNOSIS — L97529 Non-pressure chronic ulcer of other part of left foot with unspecified severity: Secondary | ICD-10-CM | POA: Diagnosis not present

## 2018-04-21 DIAGNOSIS — R279 Unspecified lack of coordination: Secondary | ICD-10-CM | POA: Diagnosis not present

## 2018-04-21 DIAGNOSIS — Z743 Need for continuous supervision: Secondary | ICD-10-CM | POA: Diagnosis not present

## 2018-05-08 DIAGNOSIS — G931 Anoxic brain damage, not elsewhere classified: Secondary | ICD-10-CM | POA: Diagnosis not present

## 2018-05-09 DIAGNOSIS — G931 Anoxic brain damage, not elsewhere classified: Secondary | ICD-10-CM | POA: Diagnosis not present

## 2018-05-10 DIAGNOSIS — G931 Anoxic brain damage, not elsewhere classified: Secondary | ICD-10-CM | POA: Diagnosis not present

## 2018-05-12 DIAGNOSIS — G931 Anoxic brain damage, not elsewhere classified: Secondary | ICD-10-CM | POA: Diagnosis not present

## 2018-05-13 ENCOUNTER — Encounter (HOSPITAL_BASED_OUTPATIENT_CLINIC_OR_DEPARTMENT_OTHER): Payer: Medicare Other | Attending: Internal Medicine

## 2018-05-13 ENCOUNTER — Other Ambulatory Visit (HOSPITAL_COMMUNITY)
Admission: RE | Admit: 2018-05-13 | Discharge: 2018-05-13 | Disposition: A | Discharge status: Home / Self Care | Payer: Medicare Other | Source: Other Acute Inpatient Hospital | Attending: Internal Medicine | Admitting: Internal Medicine

## 2018-05-13 DIAGNOSIS — L03116 Cellulitis of left lower limb: Secondary | ICD-10-CM | POA: Insufficient documentation

## 2018-05-13 DIAGNOSIS — I110 Hypertensive heart disease with heart failure: Secondary | ICD-10-CM | POA: Insufficient documentation

## 2018-05-13 DIAGNOSIS — R29898 Other symptoms and signs involving the musculoskeletal system: Secondary | ICD-10-CM | POA: Diagnosis not present

## 2018-05-13 DIAGNOSIS — S31501A Unspecified open wound of unspecified external genital organs, male, initial encounter: Secondary | ICD-10-CM | POA: Insufficient documentation

## 2018-05-13 DIAGNOSIS — X58XXXA Exposure to other specified factors, initial encounter: Secondary | ICD-10-CM | POA: Diagnosis not present

## 2018-05-13 DIAGNOSIS — E11621 Type 2 diabetes mellitus with foot ulcer: Secondary | ICD-10-CM | POA: Insufficient documentation

## 2018-05-13 DIAGNOSIS — L97522 Non-pressure chronic ulcer of other part of left foot with fat layer exposed: Secondary | ICD-10-CM | POA: Diagnosis not present

## 2018-05-13 DIAGNOSIS — I2510 Atherosclerotic heart disease of native coronary artery without angina pectoris: Secondary | ICD-10-CM | POA: Insufficient documentation

## 2018-05-13 DIAGNOSIS — L89899 Pressure ulcer of other site, unspecified stage: Secondary | ICD-10-CM | POA: Diagnosis not present

## 2018-05-13 DIAGNOSIS — I252 Old myocardial infarction: Secondary | ICD-10-CM | POA: Diagnosis not present

## 2018-05-13 DIAGNOSIS — E1140 Type 2 diabetes mellitus with diabetic neuropathy, unspecified: Secondary | ICD-10-CM | POA: Diagnosis not present

## 2018-05-13 DIAGNOSIS — Z743 Need for continuous supervision: Secondary | ICD-10-CM | POA: Diagnosis not present

## 2018-05-13 DIAGNOSIS — S31839A Unspecified open wound of anus, initial encounter: Secondary | ICD-10-CM | POA: Insufficient documentation

## 2018-05-13 DIAGNOSIS — I509 Heart failure, unspecified: Secondary | ICD-10-CM | POA: Diagnosis not present

## 2018-05-13 DIAGNOSIS — S91105A Unspecified open wound of left lesser toe(s) without damage to nail, initial encounter: Secondary | ICD-10-CM | POA: Diagnosis not present

## 2018-05-13 DIAGNOSIS — G931 Anoxic brain damage, not elsewhere classified: Secondary | ICD-10-CM | POA: Diagnosis not present

## 2018-05-14 DIAGNOSIS — G931 Anoxic brain damage, not elsewhere classified: Secondary | ICD-10-CM | POA: Diagnosis not present

## 2018-05-15 DIAGNOSIS — T83098D Other mechanical complication of other indwelling urethral catheter, subsequent encounter: Secondary | ICD-10-CM | POA: Diagnosis not present

## 2018-05-15 DIAGNOSIS — G931 Anoxic brain damage, not elsewhere classified: Secondary | ICD-10-CM | POA: Diagnosis not present

## 2018-05-15 DIAGNOSIS — R634 Abnormal weight loss: Secondary | ICD-10-CM | POA: Diagnosis not present

## 2018-05-15 DIAGNOSIS — M86179 Other acute osteomyelitis, unspecified ankle and foot: Secondary | ICD-10-CM | POA: Diagnosis not present

## 2018-05-15 DIAGNOSIS — L89159 Pressure ulcer of sacral region, unspecified stage: Secondary | ICD-10-CM | POA: Diagnosis not present

## 2018-05-15 LAB — AEROBIC CULTURE W GRAM STAIN (SUPERFICIAL SPECIMEN): Gram Stain: NONE SEEN

## 2018-05-15 LAB — AEROBIC CULTURE  (SUPERFICIAL SPECIMEN)

## 2018-05-16 DIAGNOSIS — G931 Anoxic brain damage, not elsewhere classified: Secondary | ICD-10-CM | POA: Diagnosis not present

## 2018-05-19 DIAGNOSIS — G931 Anoxic brain damage, not elsewhere classified: Secondary | ICD-10-CM | POA: Diagnosis not present

## 2018-05-20 DIAGNOSIS — G931 Anoxic brain damage, not elsewhere classified: Secondary | ICD-10-CM | POA: Diagnosis not present

## 2018-05-21 DIAGNOSIS — G931 Anoxic brain damage, not elsewhere classified: Secondary | ICD-10-CM | POA: Diagnosis not present

## 2018-05-22 DIAGNOSIS — G931 Anoxic brain damage, not elsewhere classified: Secondary | ICD-10-CM | POA: Diagnosis not present

## 2018-05-23 DIAGNOSIS — G931 Anoxic brain damage, not elsewhere classified: Secondary | ICD-10-CM | POA: Diagnosis not present

## 2018-05-26 DIAGNOSIS — G931 Anoxic brain damage, not elsewhere classified: Secondary | ICD-10-CM | POA: Diagnosis not present

## 2018-05-27 DIAGNOSIS — G931 Anoxic brain damage, not elsewhere classified: Secondary | ICD-10-CM | POA: Diagnosis not present

## 2018-05-28 DIAGNOSIS — G931 Anoxic brain damage, not elsewhere classified: Secondary | ICD-10-CM | POA: Diagnosis not present

## 2018-05-29 DIAGNOSIS — G931 Anoxic brain damage, not elsewhere classified: Secondary | ICD-10-CM | POA: Diagnosis not present

## 2018-05-30 DIAGNOSIS — G931 Anoxic brain damage, not elsewhere classified: Secondary | ICD-10-CM | POA: Diagnosis not present

## 2018-06-02 DIAGNOSIS — G931 Anoxic brain damage, not elsewhere classified: Secondary | ICD-10-CM | POA: Diagnosis not present

## 2018-06-03 DIAGNOSIS — L89899 Pressure ulcer of other site, unspecified stage: Secondary | ICD-10-CM | POA: Diagnosis not present

## 2018-06-03 DIAGNOSIS — Z7401 Bed confinement status: Secondary | ICD-10-CM | POA: Diagnosis not present

## 2018-06-03 DIAGNOSIS — G931 Anoxic brain damage, not elsewhere classified: Secondary | ICD-10-CM | POA: Diagnosis not present

## 2018-06-03 DIAGNOSIS — L03116 Cellulitis of left lower limb: Secondary | ICD-10-CM | POA: Diagnosis not present

## 2018-06-03 DIAGNOSIS — M2550 Pain in unspecified joint: Secondary | ICD-10-CM | POA: Diagnosis not present

## 2018-06-03 DIAGNOSIS — L97522 Non-pressure chronic ulcer of other part of left foot with fat layer exposed: Secondary | ICD-10-CM | POA: Diagnosis not present

## 2018-06-03 DIAGNOSIS — I110 Hypertensive heart disease with heart failure: Secondary | ICD-10-CM | POA: Diagnosis not present

## 2018-06-03 DIAGNOSIS — S31501A Unspecified open wound of unspecified external genital organs, male, initial encounter: Secondary | ICD-10-CM | POA: Diagnosis not present

## 2018-06-03 DIAGNOSIS — S31839A Unspecified open wound of anus, initial encounter: Secondary | ICD-10-CM | POA: Diagnosis not present

## 2018-06-03 DIAGNOSIS — S91105A Unspecified open wound of left lesser toe(s) without damage to nail, initial encounter: Secondary | ICD-10-CM | POA: Diagnosis not present

## 2018-06-03 DIAGNOSIS — E11621 Type 2 diabetes mellitus with foot ulcer: Secondary | ICD-10-CM | POA: Diagnosis not present

## 2018-06-04 DIAGNOSIS — G931 Anoxic brain damage, not elsewhere classified: Secondary | ICD-10-CM | POA: Diagnosis not present

## 2018-06-05 DIAGNOSIS — G931 Anoxic brain damage, not elsewhere classified: Secondary | ICD-10-CM | POA: Diagnosis not present

## 2018-06-06 DIAGNOSIS — G931 Anoxic brain damage, not elsewhere classified: Secondary | ICD-10-CM | POA: Diagnosis not present

## 2018-06-10 DIAGNOSIS — G931 Anoxic brain damage, not elsewhere classified: Secondary | ICD-10-CM | POA: Diagnosis not present

## 2018-06-11 DIAGNOSIS — G931 Anoxic brain damage, not elsewhere classified: Secondary | ICD-10-CM | POA: Diagnosis not present

## 2018-06-12 DIAGNOSIS — G931 Anoxic brain damage, not elsewhere classified: Secondary | ICD-10-CM | POA: Diagnosis not present

## 2018-06-13 DIAGNOSIS — G931 Anoxic brain damage, not elsewhere classified: Secondary | ICD-10-CM | POA: Diagnosis not present

## 2018-06-14 DIAGNOSIS — G931 Anoxic brain damage, not elsewhere classified: Secondary | ICD-10-CM | POA: Diagnosis not present

## 2018-06-16 DIAGNOSIS — G931 Anoxic brain damage, not elsewhere classified: Secondary | ICD-10-CM | POA: Diagnosis not present

## 2018-06-17 DIAGNOSIS — G931 Anoxic brain damage, not elsewhere classified: Secondary | ICD-10-CM | POA: Diagnosis not present

## 2018-06-30 DIAGNOSIS — E119 Type 2 diabetes mellitus without complications: Secondary | ICD-10-CM | POA: Diagnosis not present

## 2018-07-02 DIAGNOSIS — E46 Unspecified protein-calorie malnutrition: Secondary | ICD-10-CM | POA: Diagnosis not present

## 2018-07-02 DIAGNOSIS — B372 Candidiasis of skin and nail: Secondary | ICD-10-CM | POA: Diagnosis not present

## 2018-07-02 DIAGNOSIS — N319 Neuromuscular dysfunction of bladder, unspecified: Secondary | ICD-10-CM | POA: Diagnosis not present

## 2018-07-02 DIAGNOSIS — E119 Type 2 diabetes mellitus without complications: Secondary | ICD-10-CM | POA: Diagnosis not present

## 2018-07-08 DIAGNOSIS — R1319 Other dysphagia: Secondary | ICD-10-CM | POA: Diagnosis not present

## 2018-07-09 DIAGNOSIS — R1319 Other dysphagia: Secondary | ICD-10-CM | POA: Diagnosis not present

## 2018-07-10 ENCOUNTER — Ambulatory Visit (HOSPITAL_BASED_OUTPATIENT_CLINIC_OR_DEPARTMENT_OTHER): Payer: Medicare Other

## 2018-07-10 DIAGNOSIS — R1319 Other dysphagia: Secondary | ICD-10-CM | POA: Diagnosis not present

## 2018-07-11 DIAGNOSIS — R1319 Other dysphagia: Secondary | ICD-10-CM | POA: Diagnosis not present

## 2018-07-12 DIAGNOSIS — R1319 Other dysphagia: Secondary | ICD-10-CM | POA: Diagnosis not present

## 2018-07-14 DIAGNOSIS — R1319 Other dysphagia: Secondary | ICD-10-CM | POA: Diagnosis not present

## 2018-07-15 DIAGNOSIS — R1319 Other dysphagia: Secondary | ICD-10-CM | POA: Diagnosis not present

## 2018-07-15 DIAGNOSIS — E7800 Pure hypercholesterolemia, unspecified: Secondary | ICD-10-CM | POA: Diagnosis not present

## 2018-07-16 DIAGNOSIS — E46 Unspecified protein-calorie malnutrition: Secondary | ICD-10-CM | POA: Diagnosis not present

## 2018-07-16 DIAGNOSIS — G894 Chronic pain syndrome: Secondary | ICD-10-CM | POA: Diagnosis not present

## 2018-07-16 DIAGNOSIS — L89159 Pressure ulcer of sacral region, unspecified stage: Secondary | ICD-10-CM | POA: Diagnosis not present

## 2018-07-16 DIAGNOSIS — R1319 Other dysphagia: Secondary | ICD-10-CM | POA: Diagnosis not present

## 2018-07-16 DIAGNOSIS — B372 Candidiasis of skin and nail: Secondary | ICD-10-CM | POA: Diagnosis not present

## 2018-07-17 DIAGNOSIS — A498 Other bacterial infections of unspecified site: Secondary | ICD-10-CM | POA: Diagnosis not present

## 2018-07-17 DIAGNOSIS — R1319 Other dysphagia: Secondary | ICD-10-CM | POA: Diagnosis not present

## 2018-07-18 DIAGNOSIS — R1319 Other dysphagia: Secondary | ICD-10-CM | POA: Diagnosis not present

## 2018-07-18 DIAGNOSIS — E46 Unspecified protein-calorie malnutrition: Secondary | ICD-10-CM | POA: Diagnosis not present

## 2018-07-18 DIAGNOSIS — B372 Candidiasis of skin and nail: Secondary | ICD-10-CM | POA: Diagnosis not present

## 2018-07-18 DIAGNOSIS — N319 Neuromuscular dysfunction of bladder, unspecified: Secondary | ICD-10-CM | POA: Diagnosis not present

## 2018-07-18 DIAGNOSIS — E119 Type 2 diabetes mellitus without complications: Secondary | ICD-10-CM | POA: Diagnosis not present

## 2018-07-21 DIAGNOSIS — R1319 Other dysphagia: Secondary | ICD-10-CM | POA: Diagnosis not present

## 2018-07-22 DIAGNOSIS — R1319 Other dysphagia: Secondary | ICD-10-CM | POA: Diagnosis not present

## 2018-07-23 DIAGNOSIS — R1319 Other dysphagia: Secondary | ICD-10-CM | POA: Diagnosis not present

## 2018-07-24 DIAGNOSIS — R1319 Other dysphagia: Secondary | ICD-10-CM | POA: Diagnosis not present

## 2018-07-25 DIAGNOSIS — R1319 Other dysphagia: Secondary | ICD-10-CM | POA: Diagnosis not present

## 2018-07-28 DIAGNOSIS — R1319 Other dysphagia: Secondary | ICD-10-CM | POA: Diagnosis not present

## 2018-07-29 DIAGNOSIS — B351 Tinea unguium: Secondary | ICD-10-CM | POA: Diagnosis not present

## 2018-07-29 DIAGNOSIS — Z7984 Long term (current) use of oral hypoglycemic drugs: Secondary | ICD-10-CM | POA: Diagnosis not present

## 2018-07-29 DIAGNOSIS — Z89411 Acquired absence of right great toe: Secondary | ICD-10-CM | POA: Diagnosis not present

## 2018-07-29 DIAGNOSIS — R1319 Other dysphagia: Secondary | ICD-10-CM | POA: Diagnosis not present

## 2018-07-29 DIAGNOSIS — E1151 Type 2 diabetes mellitus with diabetic peripheral angiopathy without gangrene: Secondary | ICD-10-CM | POA: Diagnosis not present

## 2018-07-30 DIAGNOSIS — R1319 Other dysphagia: Secondary | ICD-10-CM | POA: Diagnosis not present

## 2018-07-31 DIAGNOSIS — R1319 Other dysphagia: Secondary | ICD-10-CM | POA: Diagnosis not present

## 2018-08-01 DIAGNOSIS — R1319 Other dysphagia: Secondary | ICD-10-CM | POA: Diagnosis not present

## 2018-08-04 DIAGNOSIS — R1319 Other dysphagia: Secondary | ICD-10-CM | POA: Diagnosis not present

## 2018-08-05 DIAGNOSIS — R1319 Other dysphagia: Secondary | ICD-10-CM | POA: Diagnosis not present

## 2018-08-06 DIAGNOSIS — R1319 Other dysphagia: Secondary | ICD-10-CM | POA: Diagnosis not present

## 2018-08-18 DIAGNOSIS — Z79899 Other long term (current) drug therapy: Secondary | ICD-10-CM | POA: Diagnosis not present

## 2018-08-27 DIAGNOSIS — E119 Type 2 diabetes mellitus without complications: Secondary | ICD-10-CM | POA: Diagnosis not present

## 2018-08-27 DIAGNOSIS — B372 Candidiasis of skin and nail: Secondary | ICD-10-CM | POA: Diagnosis not present

## 2018-08-27 DIAGNOSIS — E46 Unspecified protein-calorie malnutrition: Secondary | ICD-10-CM | POA: Diagnosis not present

## 2018-08-27 DIAGNOSIS — N319 Neuromuscular dysfunction of bladder, unspecified: Secondary | ICD-10-CM | POA: Diagnosis not present

## 2018-09-08 DIAGNOSIS — Z13818 Encounter for screening for other digestive system disorders: Secondary | ICD-10-CM | POA: Diagnosis not present

## 2018-09-08 DIAGNOSIS — Z20828 Contact with and (suspected) exposure to other viral communicable diseases: Secondary | ICD-10-CM | POA: Diagnosis not present

## 2018-09-19 DIAGNOSIS — Z933 Colostomy status: Secondary | ICD-10-CM | POA: Diagnosis not present

## 2018-09-19 DIAGNOSIS — I69320 Aphasia following cerebral infarction: Secondary | ICD-10-CM | POA: Diagnosis not present

## 2018-09-19 DIAGNOSIS — I6389 Other cerebral infarction: Secondary | ICD-10-CM | POA: Diagnosis not present

## 2018-09-19 DIAGNOSIS — E1140 Type 2 diabetes mellitus with diabetic neuropathy, unspecified: Secondary | ICD-10-CM | POA: Diagnosis not present

## 2018-09-19 DIAGNOSIS — N319 Neuromuscular dysfunction of bladder, unspecified: Secondary | ICD-10-CM | POA: Diagnosis not present

## 2018-10-03 DIAGNOSIS — E1140 Type 2 diabetes mellitus with diabetic neuropathy, unspecified: Secondary | ICD-10-CM | POA: Diagnosis not present

## 2018-10-03 DIAGNOSIS — Z89411 Acquired absence of right great toe: Secondary | ICD-10-CM | POA: Diagnosis not present

## 2018-10-03 DIAGNOSIS — B351 Tinea unguium: Secondary | ICD-10-CM | POA: Diagnosis not present

## 2018-10-03 DIAGNOSIS — Z7984 Long term (current) use of oral hypoglycemic drugs: Secondary | ICD-10-CM | POA: Diagnosis not present

## 2018-10-15 DIAGNOSIS — Z933 Colostomy status: Secondary | ICD-10-CM | POA: Diagnosis not present

## 2018-10-15 DIAGNOSIS — I69320 Aphasia following cerebral infarction: Secondary | ICD-10-CM | POA: Diagnosis not present

## 2018-10-15 DIAGNOSIS — E1140 Type 2 diabetes mellitus with diabetic neuropathy, unspecified: Secondary | ICD-10-CM | POA: Diagnosis not present

## 2018-10-15 DIAGNOSIS — L89153 Pressure ulcer of sacral region, stage 3: Secondary | ICD-10-CM | POA: Diagnosis not present

## 2018-10-15 DIAGNOSIS — N319 Neuromuscular dysfunction of bladder, unspecified: Secondary | ICD-10-CM | POA: Diagnosis not present

## 2018-10-16 DIAGNOSIS — R1319 Other dysphagia: Secondary | ICD-10-CM | POA: Diagnosis not present

## 2018-10-17 DIAGNOSIS — R1319 Other dysphagia: Secondary | ICD-10-CM | POA: Diagnosis not present

## 2018-10-20 DIAGNOSIS — R1319 Other dysphagia: Secondary | ICD-10-CM | POA: Diagnosis not present

## 2018-10-21 DIAGNOSIS — E7800 Pure hypercholesterolemia, unspecified: Secondary | ICD-10-CM | POA: Diagnosis not present

## 2018-10-21 DIAGNOSIS — R1319 Other dysphagia: Secondary | ICD-10-CM | POA: Diagnosis not present

## 2018-10-22 DIAGNOSIS — R1319 Other dysphagia: Secondary | ICD-10-CM | POA: Diagnosis not present

## 2018-10-23 DIAGNOSIS — R1319 Other dysphagia: Secondary | ICD-10-CM | POA: Diagnosis not present

## 2018-10-24 DIAGNOSIS — R1319 Other dysphagia: Secondary | ICD-10-CM | POA: Diagnosis not present

## 2018-10-27 DIAGNOSIS — R1319 Other dysphagia: Secondary | ICD-10-CM | POA: Diagnosis not present

## 2018-10-28 DIAGNOSIS — R1319 Other dysphagia: Secondary | ICD-10-CM | POA: Diagnosis not present

## 2018-10-29 DIAGNOSIS — R1319 Other dysphagia: Secondary | ICD-10-CM | POA: Diagnosis not present

## 2018-10-30 DIAGNOSIS — R1319 Other dysphagia: Secondary | ICD-10-CM | POA: Diagnosis not present

## 2018-10-31 DIAGNOSIS — R1319 Other dysphagia: Secondary | ICD-10-CM | POA: Diagnosis not present

## 2018-11-03 DIAGNOSIS — R1319 Other dysphagia: Secondary | ICD-10-CM | POA: Diagnosis not present

## 2018-11-12 DIAGNOSIS — N319 Neuromuscular dysfunction of bladder, unspecified: Secondary | ICD-10-CM | POA: Diagnosis not present

## 2018-11-12 DIAGNOSIS — L89153 Pressure ulcer of sacral region, stage 3: Secondary | ICD-10-CM | POA: Diagnosis not present

## 2018-11-12 DIAGNOSIS — I6389 Other cerebral infarction: Secondary | ICD-10-CM | POA: Diagnosis not present

## 2018-11-12 DIAGNOSIS — I69320 Aphasia following cerebral infarction: Secondary | ICD-10-CM | POA: Diagnosis not present

## 2018-11-12 DIAGNOSIS — E1140 Type 2 diabetes mellitus with diabetic neuropathy, unspecified: Secondary | ICD-10-CM | POA: Diagnosis not present

## 2018-12-08 DIAGNOSIS — Z1383 Encounter for screening for respiratory disorder NEC: Secondary | ICD-10-CM | POA: Diagnosis not present

## 2018-12-08 DIAGNOSIS — Z20828 Contact with and (suspected) exposure to other viral communicable diseases: Secondary | ICD-10-CM | POA: Diagnosis not present

## 2018-12-11 DIAGNOSIS — Z1383 Encounter for screening for respiratory disorder NEC: Secondary | ICD-10-CM | POA: Diagnosis not present

## 2018-12-11 DIAGNOSIS — Z20828 Contact with and (suspected) exposure to other viral communicable diseases: Secondary | ICD-10-CM | POA: Diagnosis not present

## 2018-12-12 ENCOUNTER — Other Ambulatory Visit: Payer: Self-pay

## 2018-12-12 ENCOUNTER — Inpatient Hospital Stay (HOSPITAL_COMMUNITY)
Admission: EM | Admit: 2018-12-12 | Discharge: 2018-12-16 | DRG: 871 | Disposition: A | Discharge status: Skilled Nursing Facility | Payer: Medicare Other | Attending: Family Medicine | Admitting: Family Medicine

## 2018-12-12 ENCOUNTER — Emergency Department (HOSPITAL_COMMUNITY): Payer: Medicare Other

## 2018-12-12 DIAGNOSIS — D649 Anemia, unspecified: Secondary | ICD-10-CM | POA: Diagnosis present

## 2018-12-12 DIAGNOSIS — Z955 Presence of coronary angioplasty implant and graft: Secondary | ICD-10-CM

## 2018-12-12 DIAGNOSIS — G8929 Other chronic pain: Secondary | ICD-10-CM | POA: Diagnosis present

## 2018-12-12 DIAGNOSIS — R634 Abnormal weight loss: Secondary | ICD-10-CM | POA: Diagnosis not present

## 2018-12-12 DIAGNOSIS — J189 Pneumonia, unspecified organism: Secondary | ICD-10-CM | POA: Diagnosis present

## 2018-12-12 DIAGNOSIS — Z7984 Long term (current) use of oral hypoglycemic drugs: Secondary | ICD-10-CM

## 2018-12-12 DIAGNOSIS — E1140 Type 2 diabetes mellitus with diabetic neuropathy, unspecified: Secondary | ICD-10-CM | POA: Diagnosis not present

## 2018-12-12 DIAGNOSIS — R0689 Other abnormalities of breathing: Secondary | ICD-10-CM | POA: Diagnosis not present

## 2018-12-12 DIAGNOSIS — G894 Chronic pain syndrome: Secondary | ICD-10-CM | POA: Diagnosis not present

## 2018-12-12 DIAGNOSIS — M2042 Other hammer toe(s) (acquired), left foot: Secondary | ICD-10-CM | POA: Diagnosis not present

## 2018-12-12 DIAGNOSIS — Z8673 Personal history of transient ischemic attack (TIA), and cerebral infarction without residual deficits: Secondary | ICD-10-CM

## 2018-12-12 DIAGNOSIS — D638 Anemia in other chronic diseases classified elsewhere: Secondary | ICD-10-CM | POA: Diagnosis not present

## 2018-12-12 DIAGNOSIS — H2513 Age-related nuclear cataract, bilateral: Secondary | ICD-10-CM | POA: Diagnosis not present

## 2018-12-12 DIAGNOSIS — N390 Urinary tract infection, site not specified: Secondary | ICD-10-CM | POA: Diagnosis not present

## 2018-12-12 DIAGNOSIS — I959 Hypotension, unspecified: Secondary | ICD-10-CM | POA: Diagnosis not present

## 2018-12-12 DIAGNOSIS — R6520 Severe sepsis without septic shock: Secondary | ICD-10-CM | POA: Diagnosis not present

## 2018-12-12 DIAGNOSIS — T83510A Infection and inflammatory reaction due to cystostomy catheter, initial encounter: Secondary | ICD-10-CM | POA: Diagnosis not present

## 2018-12-12 DIAGNOSIS — R110 Nausea: Secondary | ICD-10-CM | POA: Diagnosis not present

## 2018-12-12 DIAGNOSIS — Z933 Colostomy status: Secondary | ICD-10-CM

## 2018-12-12 DIAGNOSIS — Z7401 Bed confinement status: Secondary | ICD-10-CM | POA: Diagnosis not present

## 2018-12-12 DIAGNOSIS — Z79899 Other long term (current) drug therapy: Secondary | ICD-10-CM

## 2018-12-12 DIAGNOSIS — Z87820 Personal history of traumatic brain injury: Secondary | ICD-10-CM

## 2018-12-12 DIAGNOSIS — R918 Other nonspecific abnormal finding of lung field: Secondary | ICD-10-CM | POA: Diagnosis not present

## 2018-12-12 DIAGNOSIS — IMO0002 Reserved for concepts with insufficient information to code with codable children: Secondary | ICD-10-CM | POA: Diagnosis present

## 2018-12-12 DIAGNOSIS — B351 Tinea unguium: Secondary | ICD-10-CM | POA: Diagnosis not present

## 2018-12-12 DIAGNOSIS — Z87891 Personal history of nicotine dependence: Secondary | ICD-10-CM

## 2018-12-12 DIAGNOSIS — I252 Old myocardial infarction: Secondary | ICD-10-CM | POA: Diagnosis not present

## 2018-12-12 DIAGNOSIS — I2510 Atherosclerotic heart disease of native coronary artery without angina pectoris: Secondary | ICD-10-CM | POA: Diagnosis present

## 2018-12-12 DIAGNOSIS — R0902 Hypoxemia: Secondary | ICD-10-CM | POA: Diagnosis not present

## 2018-12-12 DIAGNOSIS — R509 Fever, unspecified: Secondary | ICD-10-CM

## 2018-12-12 DIAGNOSIS — R000 Tachycardia, unspecified: Secondary | ICD-10-CM | POA: Diagnosis present

## 2018-12-12 DIAGNOSIS — A419 Sepsis, unspecified organism: Principal | ICD-10-CM

## 2018-12-12 DIAGNOSIS — E440 Moderate protein-calorie malnutrition: Secondary | ICD-10-CM | POA: Diagnosis present

## 2018-12-12 DIAGNOSIS — R531 Weakness: Secondary | ICD-10-CM | POA: Diagnosis not present

## 2018-12-12 DIAGNOSIS — I480 Paroxysmal atrial fibrillation: Secondary | ICD-10-CM | POA: Diagnosis present

## 2018-12-12 DIAGNOSIS — F329 Major depressive disorder, single episode, unspecified: Secondary | ICD-10-CM | POA: Diagnosis present

## 2018-12-12 DIAGNOSIS — E1151 Type 2 diabetes mellitus with diabetic peripheral angiopathy without gangrene: Secondary | ICD-10-CM | POA: Diagnosis not present

## 2018-12-12 DIAGNOSIS — Z833 Family history of diabetes mellitus: Secondary | ICD-10-CM

## 2018-12-12 DIAGNOSIS — Z20828 Contact with and (suspected) exposure to other viral communicable diseases: Secondary | ICD-10-CM | POA: Diagnosis present

## 2018-12-12 DIAGNOSIS — Z209 Contact with and (suspected) exposure to unspecified communicable disease: Secondary | ICD-10-CM | POA: Diagnosis not present

## 2018-12-12 DIAGNOSIS — Z6832 Body mass index (BMI) 32.0-32.9, adult: Secondary | ICD-10-CM

## 2018-12-12 DIAGNOSIS — Z8249 Family history of ischemic heart disease and other diseases of the circulatory system: Secondary | ICD-10-CM | POA: Diagnosis not present

## 2018-12-12 DIAGNOSIS — Z89411 Acquired absence of right great toe: Secondary | ICD-10-CM | POA: Diagnosis not present

## 2018-12-12 DIAGNOSIS — L02215 Cutaneous abscess of perineum: Secondary | ICD-10-CM | POA: Diagnosis not present

## 2018-12-12 DIAGNOSIS — F331 Major depressive disorder, recurrent, moderate: Secondary | ICD-10-CM | POA: Diagnosis not present

## 2018-12-12 DIAGNOSIS — N319 Neuromuscular dysfunction of bladder, unspecified: Secondary | ICD-10-CM | POA: Diagnosis not present

## 2018-12-12 DIAGNOSIS — R1110 Vomiting, unspecified: Secondary | ICD-10-CM | POA: Diagnosis not present

## 2018-12-12 DIAGNOSIS — M2041 Other hammer toe(s) (acquired), right foot: Secondary | ICD-10-CM | POA: Diagnosis not present

## 2018-12-12 DIAGNOSIS — Z7901 Long term (current) use of anticoagulants: Secondary | ICD-10-CM

## 2018-12-12 DIAGNOSIS — A408 Other streptococcal sepsis: Secondary | ICD-10-CM | POA: Diagnosis not present

## 2018-12-12 DIAGNOSIS — Y95 Nosocomial condition: Secondary | ICD-10-CM | POA: Diagnosis present

## 2018-12-12 DIAGNOSIS — I10 Essential (primary) hypertension: Secondary | ICD-10-CM | POA: Diagnosis present

## 2018-12-12 DIAGNOSIS — Z5320 Procedure and treatment not carried out because of patient's decision for unspecified reasons: Secondary | ICD-10-CM | POA: Diagnosis not present

## 2018-12-12 DIAGNOSIS — E1165 Type 2 diabetes mellitus with hyperglycemia: Secondary | ICD-10-CM | POA: Diagnosis present

## 2018-12-12 DIAGNOSIS — M6281 Muscle weakness (generalized): Secondary | ICD-10-CM | POA: Diagnosis not present

## 2018-12-12 DIAGNOSIS — I6389 Other cerebral infarction: Secondary | ICD-10-CM | POA: Diagnosis not present

## 2018-12-12 DIAGNOSIS — M25559 Pain in unspecified hip: Secondary | ICD-10-CM | POA: Diagnosis not present

## 2018-12-12 DIAGNOSIS — L89153 Pressure ulcer of sacral region, stage 3: Secondary | ICD-10-CM | POA: Diagnosis not present

## 2018-12-12 DIAGNOSIS — G2581 Restless legs syndrome: Secondary | ICD-10-CM | POA: Diagnosis present

## 2018-12-12 DIAGNOSIS — R140 Abdominal distension (gaseous): Secondary | ICD-10-CM | POA: Diagnosis not present

## 2018-12-12 DIAGNOSIS — I69320 Aphasia following cerebral infarction: Secondary | ICD-10-CM | POA: Diagnosis not present

## 2018-12-12 HISTORY — DX: Paroxysmal atrial fibrillation: I48.0

## 2018-12-12 HISTORY — DX: Cerebral infarction, unspecified: I63.9

## 2018-12-12 LAB — COMPREHENSIVE METABOLIC PANEL
ALT: 25 U/L (ref 0–44)
AST: 22 U/L (ref 15–41)
Albumin: 3.5 g/dL (ref 3.5–5.0)
Alkaline Phosphatase: 61 U/L (ref 38–126)
Anion gap: 12 (ref 5–15)
BUN: 23 mg/dL — ABNORMAL HIGH (ref 6–20)
CO2: 24 mmol/L (ref 22–32)
Calcium: 8.7 mg/dL — ABNORMAL LOW (ref 8.9–10.3)
Chloride: 99 mmol/L (ref 98–111)
Creatinine, Ser: 1.18 mg/dL (ref 0.61–1.24)
GFR calc Af Amer: >60 mL/min (ref >60)
GFR calc non Af Amer: >60 mL/min (ref >60)
Glucose, Bld: 124 mg/dL — ABNORMAL HIGH (ref 70–99)
Potassium: 4.3 mmol/L (ref 3.5–5.1)
Sodium: 135 mmol/L (ref 135–145)
Total Bilirubin: 0.6 mg/dL (ref 0.3–1.2)
Total Protein: 7.2 g/dL (ref 6.5–8.1)

## 2018-12-12 LAB — URINALYSIS, ROUTINE W REFLEX MICROSCOPIC
Bilirubin Urine: NEGATIVE
Glucose, UA: NEGATIVE mg/dL
Hgb urine dipstick: NEGATIVE
Ketones, ur: 20 mg/dL — AB
Nitrite: NEGATIVE
Protein, ur: >=300 mg/dL — AB
Specific Gravity, Urine: 1.024 (ref 1.005–1.030)
WBC, UA: >50 WBC/hpf — ABNORMAL HIGH (ref 0–5)
pH: 9 — ABNORMAL HIGH (ref 5.0–8.0)

## 2018-12-12 LAB — CBC WITH DIFFERENTIAL/PLATELET
Abs Immature Granulocytes: 0.01 10*3/uL (ref 0.00–0.07)
Basophils Absolute: 0 10*3/uL (ref 0.0–0.1)
Basophils Relative: 0 %
Eosinophils Absolute: 0 10*3/uL (ref 0.0–0.5)
Eosinophils Relative: 0 %
HCT: 37.2 % — ABNORMAL LOW (ref 39.0–52.0)
Hemoglobin: 12 g/dL — ABNORMAL LOW (ref 13.0–17.0)
Immature Granulocytes: 0 %
Lymphocytes Relative: 17 %
Lymphs Abs: 0.6 10*3/uL — ABNORMAL LOW (ref 0.7–4.0)
MCH: 27.7 pg (ref 26.0–34.0)
MCHC: 32.3 g/dL (ref 30.0–36.0)
MCV: 85.9 fL (ref 80.0–100.0)
Monocytes Absolute: 0.3 10*3/uL (ref 0.1–1.0)
Monocytes Relative: 9 %
Neutro Abs: 2.7 10*3/uL (ref 1.7–7.7)
Neutrophils Relative %: 74 %
Platelets: 152 10*3/uL (ref 150–400)
RBC: 4.33 MIL/uL (ref 4.22–5.81)
RDW: 14.5 % (ref 11.5–15.5)
WBC: 3.6 10*3/uL — ABNORMAL LOW (ref 4.0–10.5)
nRBC: 0 % (ref 0.0–0.2)

## 2018-12-12 LAB — PROTIME-INR
INR: 1 (ref 0.8–1.2)
Prothrombin Time: 13.6 seconds (ref 11.4–15.2)

## 2018-12-12 LAB — LACTIC ACID, PLASMA
Lactic Acid, Venous: 1 mmol/L (ref 0.5–1.9)
Lactic Acid, Venous: 0.8 mmol/L (ref 0.5–1.9)

## 2018-12-12 LAB — APTT: aPTT: 27 seconds (ref 24–36)

## 2018-12-12 LAB — TROPONIN I (HIGH SENSITIVITY): Troponin I (High Sensitivity): 6 ng/L (ref <18)

## 2018-12-12 LAB — D-DIMER, QUANTITATIVE: D-Dimer, Quant: 0.73 ug/mL-FEU — ABNORMAL HIGH (ref 0.00–0.50)

## 2018-12-12 LAB — SARS CORONAVIRUS 2 BY RT PCR (HOSPITAL ORDER, PERFORMED IN ~~LOC~~ HOSPITAL LAB): SARS Coronavirus 2: NEGATIVE

## 2018-12-12 MED ORDER — INSULIN ASPART 100 UNIT/ML ~~LOC~~ SOLN
0.0000 [IU] | Freq: Three times a day (TID) | SUBCUTANEOUS | Status: DC
  Administered 2018-12-13 – 2018-12-16 (×4): 1 [IU] via SUBCUTANEOUS

## 2018-12-12 MED ORDER — SODIUM CHLORIDE 0.9 % IV SOLN
2.0000 g | Freq: Three times a day (TID) | INTRAVENOUS | Status: DC
  Administered 2018-12-13 – 2018-12-16 (×10): 2 g via INTRAVENOUS
  Filled 2018-12-12 (×11): qty 2

## 2018-12-12 MED ORDER — VANCOMYCIN HCL IN DEXTROSE 1-5 GM/200ML-% IV SOLN
1000.0000 mg | INTRAVENOUS | Status: AC
  Administered 2018-12-12 (×2): 1000 mg via INTRAVENOUS
  Filled 2018-12-12 (×2): qty 200

## 2018-12-12 MED ORDER — SODIUM CHLORIDE 0.9 % IV BOLUS
1000.0000 mL | Freq: Once | INTRAVENOUS | Status: AC
  Administered 2018-12-12: 1000 mL via INTRAVENOUS

## 2018-12-12 MED ORDER — VANCOMYCIN HCL IN DEXTROSE 1-5 GM/200ML-% IV SOLN
1000.0000 mg | Freq: Three times a day (TID) | INTRAVENOUS | Status: DC
  Administered 2018-12-13 – 2018-12-14 (×7): 1000 mg via INTRAVENOUS
  Filled 2018-12-12 (×7): qty 200

## 2018-12-12 MED ORDER — SODIUM CHLORIDE 0.9 % IV SOLN
2.0000 g | Freq: Once | INTRAVENOUS | Status: AC
  Administered 2018-12-12: 2 g via INTRAVENOUS
  Filled 2018-12-12: qty 2

## 2018-12-12 MED ORDER — ENOXAPARIN SODIUM 60 MG/0.6ML ~~LOC~~ SOLN
50.0000 mg | SUBCUTANEOUS | Status: DC
  Administered 2018-12-13: 50 mg via SUBCUTANEOUS
  Filled 2018-12-12: qty 0.6

## 2018-12-12 MED ORDER — INSULIN ASPART 100 UNIT/ML ~~LOC~~ SOLN: 0.0000 [IU] | Freq: Every day | SUBCUTANEOUS | Status: DC

## 2018-12-12 MED ORDER — ACETAMINOPHEN 650 MG RE SUPP
650.0000 mg | Freq: Four times a day (QID) | RECTAL | Status: DC | PRN
  Filled 2018-12-12: qty 1

## 2018-12-12 MED ORDER — SODIUM CHLORIDE 0.9 % IV SOLN
INTRAVENOUS | Status: AC
  Administered 2018-12-13: via INTRAVENOUS

## 2018-12-12 MED ORDER — ACETAMINOPHEN 325 MG PO TABS
650.0000 mg | ORAL_TABLET | Freq: Four times a day (QID) | ORAL | Status: DC | PRN
  Administered 2018-12-13: 650 mg via ORAL
  Filled 2018-12-12: qty 2

## 2018-12-12 NOTE — ED Provider Notes (Signed)
[facility] Provider Note   CSN: [id number] Arrival date & time: 12/12/18  2038     History   Chief Complaint Chief Complaint  Patient presents with  . Fever  . Nausea  . Emesis    HPI Tye MarylandRicky Mccutcheon is a 48 y.o. male.    Level 5 caveat due to nonverbal status. HPI Patient brought in for fever.  Reportedly has had a fever for a few days.  Started vomiting a day.  Has suprapubic catheter and colostomy.  Previous anoxic brain injury.  [facility]  Reportedly is not in the area that has COVID but stays very close to it.  Unknown if he is been tested.  Do not know if he is been on antibiotics prior to this. Past Medical History:  Diagnosis Date  . Anoxic encephalopathy (HCC)   . CAD (coronary artery disease)    DES proximal RCA and aspiration thrombectomy mid RCA and PLB 01/2015  . Cardiac arrest [facility](HCC)    October 2016 with STEMI  . Essential hypertension   . Respiratory failure (HCC)    Prolonged ventilatory course, complicated by mucus plugging and ultimately required tracheostomy 10-02/2015  . 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 Ladona Ridgelaylor in 2012  . ST elevation myocardial infarction (STEMI) of inferolateral wall [facility](HCC)    October 2016   . Type 2 diabetes mellitus (HCC)   . Ventilator associated pneumonia [facility](HCC)     Patient Active Problem List   Diagnosis Date Noted  . Acute respiratory failure with hypoxemia (HCC)   . Tracheostomy status (HCC)   . HCAP (healthcare-associated pneumonia)   . Atelectasis   . Encephalopathy   . Acute renal failure (HCC)   . Urinary tract infection, site not specified   . Type II diabetes mellitus, uncontrolled (HCC)   . Disorientation   . Pressure ulcer 01/31/2015  . Acute respiratory failure (HCC)   . Anoxic encephalopathy (HCC) 01/25/2015  . Acute respiratory failure with hypoxia (HCC) 01/25/2015  . Acute ST elevation myocardial  infarction (STEMI) (HCC)   . Respiratory failure (HCC)   . Cardiac arrest (HCC) 01/22/2015  . Acute MI, inferolateral wall, initial episode of care (HCC)   . ESSENTIAL HYPERTENSION, BENIGN 05/25/2010  . VENTRICULAR TACHYCARDIA, PAROXYSMAL 05/25/2010    Past Surgical History:  Procedure Laterality Date  . CARDIAC CATHETERIZATION N/A 01/22/2015   Procedure: Left Heart Cath and Coronary Angiography;  Surgeon: Corky CraftsJayadeep S Varanasi, MD;  [facility];  Service: Cardiovascular;  Laterality: N/A;  . CARDIAC CATHETERIZATION N/A 01/22/2015   Procedure: Coronary Stent Intervention;  Surgeon: Corky CraftsJayadeep S Varanasi, MD;  [facility];  Service: Cardiovascular;  Laterality: N/A;  prox rca        Home Medications    Prior to Admission medications   Medication Sig Start Date End Date Taking? Authorizing Provider  acetaminophen (TYLENOL) 325 MG tablet Take 650 mg by mouth every 4 (four) hours as needed for mild pain.   Yes [provider]  Acidophilus Lactobacillus CAPS Take 1 capsule by mouth every morning.    Yes [provider]  apixaban (ELIQUIS) 5 MG TABS tablet Take 5 mg by mouth 2 (two) times daily.   Yes [provider]  Cholecalciferol (VITAMIN D3) 2000 units TABS Take 2,000 Units by mouth daily.   Yes [provider]  enalapril (VASOTEC) 2.5 MG tablet Take 2.5 mg by  mouth 2 (two) times daily.    Yes [provider]  famotidine (PEPCID) 20 MG tablet Take 20 mg by mouth every morning.   Yes [provider]  ketoconazole (NIZORAL) 2 % cream Apply 1 application topically daily. To face for dryness   Yes [provider]  Melatonin 3 MG TABS Take 1 tablet by mouth at bedtime.    Yes [provider]  metFORMIN (GLUCOPHAGE) 1000 MG tablet Take 1,000 mg by mouth 2 (two) times daily.    Yes [provider]  methadone (DOLOPHINE) 5 MG tablet Take 5 mg by mouth every 8 (eight) hours. 01/10/18   Yes [provider]  Multiple Vitamins-Minerals (THERA-M) TABS Take 1 tablet by mouth daily.    Yes [provider]  ondansetron (ZOFRAN-ODT) 4 MG disintegrating tablet Take 4 mg by mouth every 4 (four) hours as needed for nausea or vomiting.   Yes [provider]  rOPINIRole (REQUIP) 0.25 MG tablet Take 0.25 mg by mouth at bedtime.    Yes [provider]  sennosides-docusate sodium (SENOKOT-S) 8.6-50 MG tablet Take 1 tablet by mouth daily.   Yes [provider]  sertraline (ZOLOFT) 25 MG tablet Take 25 mg by mouth daily.   Yes [provider]  tolterodine (DETROL LA) 2 MG 24 hr capsule Take 2 mg by mouth every morning.   Yes [provider]  trolamine salicylate (ASPERCREME) 10 % cream Apply 1 application topically 2 (two) times daily.   Yes [provider]  vitamin C (ASCORBIC ACID) 500 MG tablet Take 1 tablet by mouth 2 (two) times daily.    Yes [provider]  Amino Acids-Protein Hydrolys (FEEDING SUPPLEMENT, PRO-STAT SUGAR FREE 64,) LIQD Take 30 mLs by mouth 2 (two) times daily.     [provider]    Family History Family History  Problem Relation Age of Onset  . Heart disease Mother 5850       MI  . Diabetes Father     Social History Social History   Tobacco Use  . Smoking status: Former Smoker    Quit date: 05/01/2005    Years since quitting: 13.6  . Smokeless tobacco: Never Used  Substance Use Topics  . Alcohol use: Yes    Alcohol/week: 1.0 standard drinks    Types: 1 Cans of beer per week  . Drug use: Not on file     Allergies   Patient has no known allergies.   Review of Systems Review of Systems  Unable to perform ROS: Patient nonverbal     Physical Exam Updated Vital Signs BP (!) 146/87   Pulse (!) 113   Temp (!) 100.4 F (38 C) (Oral)   Resp (!) 27   Ht 5\' 11"  (1.803 m)   Wt 100 kg   SpO2 90%   BMI 30.75 kg/m   Physical Exam Vitals signs and nursing note  reviewed.  HENT:     Head: Atraumatic.  Eyes:     Pupils: Pupils are equal, round, and reactive to light.  Neck:     Musculoskeletal: Neck supple.  Cardiovascular:     Rate and Rhythm: Tachycardia present.  Pulmonary:     Comments: Mildly harsh breath sounds throughout. Abdominal:     General: There is no distension.     Tenderness: There is no abdominal tenderness.     Comments: No clear tenderness.  Suprapubic catheter in place and has some green discharge around it.  Musculoskeletal:  Right lower leg: Edema present.     Left lower leg: Edema present.  Skin:    Comments: Sacral area has deep but small wound.  Small area of packing.  No purulent drainage.  Neurological:     Mental Status: He is alert.     Comments: Looks to voice.  Will not follow commands or answer questions.      ED Treatments / Results  Labs (all labs ordered are listed, but only abnormal results are displayed) Labs Reviewed  COMPREHENSIVE METABOLIC PANEL - Abnormal; Notable for the following components:      Result Value   Glucose, Bld 124 (*)    BUN 23 (*)    Calcium 8.7 (*)    All other components within normal limits  CBC WITH DIFFERENTIAL/PLATELET - Abnormal; Notable for the following components:   WBC 3.6 (*)    Hemoglobin 12.0 (*)    HCT 37.2 (*)    Lymphs Abs 0.6 (*)    All other components within normal limits  URINALYSIS, ROUTINE W REFLEX MICROSCOPIC - Abnormal; Notable for the following components:   APPearance TURBID (*)    pH 9.0 (*)    Ketones, ur 20 (*)    Protein, ur >=300 (*)    Leukocytes,Ua MODERATE (*)    WBC, UA >50 (*)    Bacteria, UA MANY (*)    All other components within normal limits  CULTURE, BLOOD (ROUTINE X 2)  CULTURE, BLOOD (ROUTINE X 2)  SARS CORONAVIRUS 2 (HOSPITAL ORDER, PERFORMED IN [facility])  URINE CULTURE  LACTIC ACID, PLASMA  PROTIME-INR  APTT  LACTIC ACID, PLASMA    EKG None  Radiology Dg Chest Port 1 View  Result Date:  12/12/2018 CLINICAL DATA:  Sepsis EXAM: PORTABLE CHEST 1 VIEW COMPARISON:  10/20/2017 FINDINGS: Low lung volumes. Patchy bilateral airspace opacities, best seen in the upper lobes and left lower lobe concerning for pneumonia. Heart is borderline in size. No effusions or acute bony abnormality. IMPRESSION: Low lung volumes. Patchy bilateral airspace disease concerning for pneumonia. Electronically Signed   By: Charlett Nose M.D.   On: 12/12/2018 21:50    Procedures Procedures (including critical care time)  Medications Ordered in ED Medications  vancomycin (VANCOCIN) IVPB 1000 mg/200 mL premix (1,000 mg Intravenous New Bag/Given 12/12/18 2151)  sodium chloride 0.9 % bolus 1,000 mL (1,000 mLs Intravenous New Bag/Given 12/12/18 2145)  ceFEPIme (MAXIPIME) 2 g in sodium chloride 0.9 % 100 mL IVPB (0 g Intravenous Stopped 12/12/18 2219)     Initial Impression / Assessment and Plan / ED Course  I have reviewed the triage vital signs and the nursing notes.  Pertinent labs & imaging results that were available during my care of the patient were reviewed by me and considered in my medical decision making (see chart for details).        Patient presents from [facility] nursing home.  Fever.  Has pneumonia on x-ray and has apparent urinary tract infection with chronic Foley catheter.  Also has sacral decubitus ulcer but does not appear to be any drainage at this time.  Had been vomiting.  Abdomen not clearly tender nothing this is more likely secondary to the infection.  Normal lactic acid.  Tachycardic but maintain blood pressure.  Given cefepime and vancomycin.  Code sepsis had been called but did not get the full 3 L of fluid bolus since he was not hypotensive and has normal lactic acid.  Will admit to hospitalist.  CRITICAL CARE Performed by: Davonna Belling Total critical care time: 30 minutes Critical care time was exclusive of separately billable procedures and treating other patients. Critical  care was necessary to treat or prevent imminent or life-threatening deterioration. Critical care was time spent personally by me on the following activities: development of treatment plan with patient and/or surrogate as well as nursing, discussions with consultants, evaluation of patient's response to treatment, examination of patient, obtaining history from patient or surrogate, ordering and performing treatments and interventions, ordering and review of laboratory studies, ordering and review of radiographic studies, pulse oximetry and re-evaluation of patient's condition.   Final Clinical Impressions(s) / ED Diagnoses   Final diagnoses:  Sepsis without acute organ dysfunction, due to unspecified organism [facility])  Urinary tract infection associated with cystostomy catheter, initial encounter [facility])  Healthcare-associated pneumonia    ED Discharge Orders    None       Davonna Belling, MD 12/12/18 2240

## 2018-12-12 NOTE — ED Triage Notes (Addendum)
Pt arrives via [facility] from [facility]. Staff says that pt has had a fever for several days with no improvement. Today pt stated vomiting. Pt sent to ED for eval. Pt arrives with suprapubic cath, and LLQ colostomy.

## 2018-12-12 NOTE — Progress Notes (Signed)
Pharmacy Antibiotic Note  Kayron Hicklin is a 48 y.o. male admitted on 12/12/2018 with sepsis.  Pharmacy has been consulted for cefepime and vancomycin dosing.  Plan:Vancomycin 1000mg  IV every 8 hours.  Goal trough 15-20 mcg/mL. cefepime 2gm iv q8h   Height: 5\' 11"  (180.3 cm) Weight: 220 lb 7.4 oz (100 kg) IBW/kg (Calculated) : 75.3  Temp (24hrs), Avg:100.4 F (38 C), Min:100.4 F (38 C), Max:100.4 F (38 C)  Recent Labs  Lab 12/12/18 2112  WBC 3.6*  CREATININE 1.18  LATICACIDVEN 1.0    Estimated Creatinine Clearance: 94.3 mL/min (by C-G formula based on SCr of 1.18 mg/dL).    No Known Allergies  Antimicrobials this admission: 9/4 vancomycin >>  9/4 cefepime  >>    Microbiology results: 9/4 BCx: sent   Thank you for allowing pharmacy to be a part of this patient's care.  Donna Christen Mack Thurmon 12/12/2018 10:50 PM

## 2018-12-12 NOTE — H&P (Signed)
TRH H&P    Patient Demographics:    Albert Jensen, is a 48 y.o. male  MRN: [id number]  DOB - 03/03/1971  Admit Date - 12/12/2018  Referring MD/NP/PA:  Benjiman CoreNathan Pickering  Outpatient Primary MD for the patient is Bernerd LimboAriza, Fernando Enrique, MD  [facility]  Chief complaint-  fever   HPI:    Albert MarylandRicky Jensen  is a 48 y.o. male, w Hypertension, Dm2, RVOT-VT 2012, CAD s/p STEMI 2016 w Cardiac arrest w DES to prox RCA  , [facility] several days. Pt is unable to provide history.   In ED,  T 100.4 P 121, R 26, Bp 130/92  Pox 93% on RA Wt 100kg    CXR IMPRESSION: Low lung volumes. Patchy bilateral airspace disease concerning for pneumonia.  Abdomen Xray IMPRESSION: Nonobstructed bowel-gas pattern.  Moderate gastric distension  Na 135, K 4.3,  Bun 23, Creatinine 1.18 Alb 3.5,  Ast 22, Alt 25 Wbc 3.6, Hgb 12.0, Plt 152 Lactic acid 1.0 INR 1.0  Urinalysis wbc >50,  Rbc 21-50  Blood culture x2 pending  Covid -19 negative  Pt given vanco iv, cefepime iv in ED.   Pt will be admitted for sepsis (fever tachycardia, tachypnea)       Review of systems:    In addition to the HPI above,   No Headache, No changes with Vision or hearing, No problems swallowing food or Liquids, No Chest pain, Cough or Shortness of Breath, No Abdominal pain, No Nausea or Vomiting, bowel movements are regular, No Blood in stool or Urine, No dysuria, No new skin rashes or bruises, No new joints pains-aches,  No new weakness, tingling, numbness in any extremity, No recent weight gain or loss, No polyuria, polydypsia or polyphagia, No significant Mental Stressors.  All other systems reviewed and are negative.    Past History of the following :    Past Medical History:  Diagnosis Date   Anoxic encephalopathy (HCC)    CAD (coronary artery disease)    DES proximal RCA and aspiration thrombectomy mid RCA and PLB 01/2015   Cardiac arrest [facility](HCC)    October 2016 with STEMI   Essential hypertension    Respiratory failure (HCC)    Prolonged ventilatory course, complicated by mucus plugging and ultimately required tracheostomy 10-02/2015   RVOT-VT (right ventricular outflow tract ventricular tachycardia) (HCC)    Evaluated by Dr. Ladona Ridgelaylor in 2012   ST elevation myocardial infarction (STEMI) of inferolateral wall [facility](HCC)    October 2016    Type 2 diabetes mellitus (HCC)    Ventilator associated pneumonia [facility](HCC)       Past Surgical History:  Procedure Laterality Date   CARDIAC CATHETERIZATION N/A 01/22/2015   Procedure: Left Heart Cath and Coronary Angiography;  Surgeon: Corky CraftsJayadeep S Varanasi, MD;  [facility];  Service: Cardiovascular;  Laterality: N/A;   CARDIAC CATHETERIZATION N/A 01/22/2015   Procedure: Coronary Stent Intervention;  Surgeon: Corky CraftsJayadeep S Varanasi, MD;  [facility];  Service: Cardiovascular;  Laterality: N/A;  prox rca      Social History:      Social History   Tobacco Use   Smoking status: Former Smoker    Quit date: 05/01/2005    Years since quitting: 13.6   Smokeless tobacco: Never Used  Substance Use Topics   Alcohol use: Yes    Alcohol/week: 1.0 standard drinks    Types: 1 Cans of beer per week       Family History :     Family History  Problem Relation Age of Onset   Heart disease Mother 56       MI   Diabetes Father        Home Medications:   Prior to Admission medications   Medication Sig Start Date End Date Taking? Authorizing Provider  acetaminophen (TYLENOL) 325 MG tablet Take 650 mg by mouth every 4 (four) hours as needed for mild pain.   Yes [provider]  Acidophilus Lactobacillus CAPS Take 1 capsule by mouth every morning.    Yes [provider]  apixaban (ELIQUIS) 5 MG TABS tablet Take 5 mg by mouth 2 (two) times daily.    Yes [provider]  Cholecalciferol (VITAMIN D3) 2000 units TABS Take 2,000 Units by mouth daily.   Yes [provider]  enalapril (VASOTEC) 2.5 MG tablet Take 2.5 mg by mouth 2 (two) times daily.    Yes [provider]  famotidine (PEPCID) 20 MG tablet Take 20 mg by mouth every morning.   Yes [provider]  ketoconazole (NIZORAL) 2 % cream Apply 1 application topically daily. To face for dryness   Yes [provider]  Melatonin 3 MG TABS Take 1 tablet by mouth at bedtime.    Yes [provider]  metFORMIN (GLUCOPHAGE) 1000 MG tablet Take 1,000 mg by mouth 2 (two) times daily.    Yes [provider]  methadone (DOLOPHINE) 5 MG tablet Take 5 mg by mouth every 8 (eight) hours. 01/10/18  Yes [provider]  Multiple Vitamins-Minerals (THERA-M) TABS Take 1 tablet by mouth daily.    Yes [provider]  ondansetron (ZOFRAN-ODT) 4 MG disintegrating tablet Take 4 mg by mouth every 4 (four) hours as needed for nausea or vomiting.   Yes [provider]  rOPINIRole (REQUIP) 0.25 MG tablet Take 0.25 mg by mouth at bedtime.    Yes [provider]  sennosides-docusate sodium (SENOKOT-S) 8.6-50 MG tablet Take 1 tablet by mouth daily.   Yes [provider]  sertraline (ZOLOFT) 25 MG tablet Take 25 mg by mouth daily.   Yes [provider]  tolterodine (DETROL LA) 2 MG 24 hr capsule Take 2 mg by mouth every morning.   Yes [provider]  trolamine salicylate (ASPERCREME) 10 % cream Apply 1 application topically 2 (two) times daily.   Yes [provider]  vitamin C (ASCORBIC ACID) 500 MG tablet Take 1 tablet by mouth 2 (two) times daily.    Yes [provider]  Amino Acids-Protein Hydrolys (FEEDING SUPPLEMENT, PRO-STAT SUGAR FREE 64,) LIQD Take 30 mLs by mouth 2 (two) times daily.     [provider]     Allergies:    No Known Allergies   Physical Exam:    Vitals  Blood pressure (!) 146/87, pulse (!) 113, temperature (!) 100.4 F (38 C), temperature source Oral, resp. rate (!) 27, height 5\' 11"  (1.803 m), weight 100 kg, SpO2 90 %.  1.  General:  Unable to tell,  Appears to be atleast oriented x1 (person)  2. Psychiatric: euthymic  3. Neurologic: Pt doesn't speak.  Otherwise cn2-12 intact, reflexes 2+ symmetric, diffuse with no clonus, motor 5/5 in all 4 ext Contracture of the left foot  4. HEENMT:  Anicteric, pupils 1.175mm symmetric, direct, consensual, near intact  5. Respiratory : Slight crackles bilateral base, decrease in bs at left base,  No wheezing.   6. Cardiovascular : Tachy s1, s2,   7. Gastrointestinal:  Abd: soft, nt, nd, +bs  8. Skin:  Ext: no c/c/e,  No rash  9.Musculoskeletal:  Good ROM    Data Review:    CBC Recent Labs  Lab 12/12/18 2112  WBC 3.6*  HGB 12.0*  HCT 37.2*  PLT 152  MCV 85.9  MCH 27.7  MCHC 32.3  RDW 14.5  LYMPHSABS 0.6*  MONOABS 0.3  EOSABS 0.0  BASOSABS 0.0   ------------------------------------------------------------------------------------------------------------------  Results for orders placed or performed during the hospital encounter of 12/12/18 (from the past 48 hour(s))  Comprehensive metabolic panel     Status: Abnormal   Collection Time: 12/12/18  9:12 PM  Result Value Ref Range   Sodium 135 135 - 145 mmol/L   Potassium 4.3 3.5 - 5.1 mmol/L   Chloride 99 98 - 111 mmol/L   CO2 24 22 - 32 mmol/L   Glucose, Bld 124 (H) 70 - 99 mg/dL   BUN 23 (H) 6 - 20 mg/dL   Creatinine, Ser 1.611.18 0.61 - 1.24 mg/dL   Calcium 8.7 (L) 8.9 - 10.3 mg/dL   Total Protein 7.2 6.5 - 8.1 g/dL   Albumin 3.5 3.5 - 5.0 g/dL   AST 22 15 - 41 U/L   ALT 25 0 - 44 U/L   Alkaline Phosphatase 61 38 - 126 U/L   Total Bilirubin 0.6 0.3 - 1.2 mg/dL   GFR calc non Af Amer >60 >60 mL/min   GFR calc Af Amer >60 >60 mL/min   Anion gap 12 5 - 15    Comment: Performed at [facility], [address] [place], [place] [address]  Lactic acid, plasma     Status: None   Collection Time: 12/12/18  9:12 PM  Result Value Ref Range   Lactic Acid, Venous 1.0 0.5 - 1.9 mmol/L    Comment: Performed at [facility], [address] [place], [place] [address]  CBC with Differential     Status: Abnormal   Collection Time: 12/12/18  9:12 PM  Result Value Ref Range   WBC 3.6 (L) 4.0 - 10.5 K/uL   RBC 4.33 4.22 - 5.81 MIL/uL   Hemoglobin 12.0 (L) 13.0 - 17.0 g/dL   HCT 11.937.2 (L) 14.739.0 - 82.952.0 %   MCV 85.9 80.0 - 100.0 fL   MCH 27.7 26.0 - 34.0 pg   MCHC 32.3 30.0 - 36.0 g/dL   RDW 56.214.5 13.011.5 - 86.515.5 %   Platelets 152 150 - 400 K/uL   nRBC 0.0 0.0 - 0.2 %   Neutrophils Relative % 74 %   Neutro Abs 2.7 1.7 - 7.7 K/uL   Lymphocytes Relative 17 %   Lymphs Abs 0.6 (L) 0.7 - 4.0 K/uL   Monocytes Relative 9 %   Monocytes Absolute 0.3 0.1 - 1.0 K/uL   Eosinophils Relative 0 %   Eosinophils Absolute 0.0 0.0 - 0.5 K/uL   Basophils Relative 0 %   Basophils Absolute 0.0 0.0 - 0.1 K/uL   Immature Granulocytes 0 %  Abs Immature Granulocytes 0.01 0.00 - 0.07 K/uL    Comment: Performed at [facility], [address], [place], [place] [address]  Protime-INR     Status: None   Collection Time: 12/12/18  9:12 PM  Result Value Ref Range   Prothrombin Time 13.6 11.4 - 15.2 seconds   INR 1.0 0.8 - 1.2    Comment: (NOTE) INR goal varies based on device and disease states. Performed at [facility], [address] [place], [place] [address]   Culture, blood (Routine x 2)     Status: None (Preliminary result)   Collection Time: 12/12/18  9:12 PM   Specimen: Right Antecubital; Blood  Result Value Ref Range   Specimen Description RIGHT ANTECUBITAL    Special Requests      BOTTLES DRAWN AEROBIC AND ANAEROBIC Blood Culture adequate volume Performed at [facility], [address] [place], [place] [address]    Culture PENDING    Report Status PENDING   APTT     Status: None   Collection Time: 12/12/18  9:12  PM  Result Value Ref Range   aPTT 27 24 - 36 seconds    Comment: Performed at [facility], [address] [place], [place] [address]  SARS Coronavirus 2 [facility] order, Performed in [facility] hospital lab) Nasopharyngeal Nasopharyngeal Swab     Status: None   Collection Time: 12/12/18  9:26 PM   Specimen: Nasopharyngeal Swab  Result Value Ref Range   SARS Coronavirus 2 NEGATIVE NEGATIVE    Comment: (NOTE) If result is NEGATIVE SARS-CoV-2 target nucleic acids are NOT DETECTED. The SARS-CoV-2 RNA is generally detectable in upper and lower  respiratory specimens during the acute phase of infection. The lowest  concentration of SARS-CoV-2 viral copies this assay can detect is 250  copies / mL. A negative result does not preclude SARS-CoV-2 infection  and should not be used as the sole basis for treatment or other  patient management decisions.  A negative result may occur with  improper specimen collection / handling, submission of specimen other  than nasopharyngeal swab, presence of viral mutation(s) within the  areas targeted by this assay, and inadequate number of viral copies  (<250 copies / mL). A negative result must be combined with clinical  observations, patient history, and epidemiological information. If result is POSITIVE SARS-CoV-2 target nucleic acids are DETECTED. The SARS-CoV-2 RNA is generally detectable in upper and lower  respiratory specimens dur ing the acute phase of infection.  Positive  results are indicative of active infection with SARS-CoV-2.  Clinical  correlation with patient history and other diagnostic information is  necessary to determine patient infection status.  Positive results do  not rule out bacterial infection or co-infection with other viruses. If result is PRESUMPTIVE POSTIVE SARS-CoV-2 nucleic acids MAY BE PRESENT.   A presumptive positive result was obtained on the submitted specimen  and confirmed on repeat testing.  While 2019 novel  coronavirus  (SARS-CoV-2) nucleic acids may be present in the submitted sample  additional confirmatory testing may be necessary for epidemiological  and / or clinical management purposes  to differentiate between  SARS-CoV-2 and other Sarbecovirus currently known to infect humans.  If clinically indicated additional testing with an alternate test  methodology [phone]) is advised. The SARS-CoV-2 RNA is generally  detectable in upper and lower respiratory sp ecimens during the acute  phase of infection. The expected result is Negative. Fact Sheet for Patients:  BoilerBrush.com.cy Fact Sheet for Healthcare Providers: https://pope.com/  This test is not yet approved or cleared by the [place] and has been authorized for detection and/or diagnosis of SARS-CoV-2 by FDA under an Emergency Use Authorization (EUA).  This EUA will remain in effect (meaning this test can be used) for the duration of the COVID-19 declaration under Section 564(b)(1) of the Act, 21 U.S.C. section 360bbb-3(b)(1), unless the authorization is terminated or revoked sooner. Performed at [facility], [address], [place], [place] [address]   Culture, blood (Routine x 2)     Status: None (Preliminary result)   Collection Time: 12/12/18  9:43 PM   Specimen: BLOOD LEFT ARM  Result Value Ref Range   Specimen Description BLOOD LEFT ARM    Special Requests      BOTTLES DRAWN AEROBIC AND ANAEROBIC Blood Culture adequate volume Performed at [facility], [address] [place], [place] [address]    Culture PENDING    Report Status PENDING   Urinalysis, Routine w reflex microscopic     Status: Abnormal   Collection Time: 12/12/18  9:48 PM  Result Value Ref Range   Color, Urine YELLOW YELLOW   APPearance TURBID (A) CLEAR   Specific Gravity, Urine 1.024 1.005 - 1.030   pH 9.0 (H) 5.0 - 8.0   Glucose, UA NEGATIVE NEGATIVE mg/dL   Hgb urine dipstick NEGATIVE  NEGATIVE   Bilirubin Urine NEGATIVE NEGATIVE   Ketones, ur 20 (A) NEGATIVE mg/dL   Protein, ur >=098 (A) NEGATIVE mg/dL   Nitrite NEGATIVE NEGATIVE   Leukocytes,Ua MODERATE (A) NEGATIVE   RBC / HPF 21-50 0 - 5 RBC/hpf   WBC, UA >50 (H) 0 - 5 WBC/hpf   Bacteria, UA MANY (A) NONE SEEN   Squamous Epithelial / LPF 0-5 0 - 5   Mucus PRESENT    Ca Oxalate Crys, UA PRESENT     Comment: Performed at [facility], [address] [place], [place] [address]    Chemistries  Recent Labs  Lab 12/12/18 2112  NA 135  K 4.3  CL 99  CO2 24  GLUCOSE 124*  BUN 23*  CREATININE 1.18  CALCIUM 8.7*  AST 22  ALT 25  ALKPHOS 61  BILITOT 0.6   ------------------------------------------------------------------------------------------------------------------  ------------------------------------------------------------------------------------------------------------------ GFR: Estimated Creatinine Clearance: 94.3 mL/min (by C-G formula based on SCr of 1.18 mg/dL). Liver Function Tests: Recent Labs  Lab 12/12/18 2112  AST 22  ALT 25  ALKPHOS 61  BILITOT 0.6  PROT 7.2  ALBUMIN 3.5   No results for input(s): LIPASE, AMYLASE in the last 168 hours. No results for input(s): AMMONIA in the last 168 hours. Coagulation Profile: Recent Labs  Lab 12/12/18 2112  INR 1.0   Cardiac Enzymes: No results for input(s): CKTOTAL, CKMB, CKMBINDEX, TROPONINI in the last 168 hours. BNP (last 3 results) No results for input(s): PROBNP in the last 8760 hours. HbA1C: No results for input(s): HGBA1C in the last 72 hours. CBG: No results for input(s): GLUCAP in the last 168 hours. Lipid Profile: No results for input(s): CHOL, HDL, LDLCALC, TRIG, CHOLHDL, LDLDIRECT in the last 72 hours. Thyroid Function Tests: No results for input(s): TSH, T4TOTAL, FREET4, T3FREE, THYROIDAB in the last 72 hours. Anemia Panel: No results for input(s): VITAMINB12, FOLATE, FERRITIN, TIBC, IRON, RETICCTPCT in the last 72  hours.  --------------------------------------------------------------------------------------------------------------- Urine analysis:    Component Value Date/Time   COLORURINE YELLOW 12/12/2018 2148   APPEARANCEUR TURBID (A) 12/12/2018 2148   LABSPEC 1.024 12/12/2018 2148   PHURINE 9.0 (H) 12/12/2018 2148  GLUCOSEU NEGATIVE 12/12/2018 2148   HGBUR NEGATIVE 12/12/2018 2148   BILIRUBINUR NEGATIVE 12/12/2018 2148   KETONESUR 20 (A) 12/12/2018 2148   PROTEINUR >=300 (A) 12/12/2018 2148   UROBILINOGEN 0.2 02/15/2015 1908   NITRITE NEGATIVE 12/12/2018 2148   LEUKOCYTESUR MODERATE (A) 12/12/2018 2148      Imaging Results:    Dg Chest Port 1 View  Result Date: 12/12/2018 CLINICAL DATA:  Sepsis EXAM: PORTABLE CHEST 1 VIEW COMPARISON:  10/20/2017 FINDINGS: Low lung volumes. Patchy bilateral airspace opacities, best seen in the upper lobes and left lower lobe concerning for pneumonia. Heart is borderline in size. No effusions or acute bony abnormality. IMPRESSION: Low lung volumes. Patchy bilateral airspace disease concerning for pneumonia. Electronically Signed   By: Charlett Nose M.D.   On: 12/12/2018 21:50   Dg Abd 2 Views  Result Date: 12/12/2018 CLINICAL DATA:  Code sepsis, fever EXAM: ABDOMEN - 2 VIEW COMPARISON:  02/25/2015, CT 01/16/2018 FINDINGS: No free air beneath the diaphragm. Moderate gastric distension. Overall nonobstructed bowel-gas pattern. Postsurgical changes in the left pelvis. Catheter over the inferior pelvis. Left lower quadrant ostomy. IMPRESSION: Nonobstructed bowel-gas pattern.  Moderate gastric distension Electronically Signed   By: Jasmine Pang M.D.   On: 12/12/2018 22:43   ekg ordered by ED but not in computer ?    Assessment & Plan:    Principal Problem:   Sepsis (HCC) Active Problems:   Essential hypertension, benign   Type II diabetes mellitus, uncontrolled (HCC)   HCAP (healthcare-associated pneumonia)   Tachycardia   Acute lower UTI  Sepsis ddx  Hcap vs UTI Blood culture x2 Urine culture pending vanco iv , cefepime iv pharmacy to dose  Tachycardia Tele Cycle cardiac markers Check tsh Check cardiac echo  Dm2 Hold Metformin fsbs ac and qhs  Protein calorie malnutrition, (moderate) Prostat 30mL po bid  Pafib per SNF records Cont Eliquis pharmacy to dose  H/o CVA per SNF records Cont Eliquis as above  Hypertension Cont Vasotec 2.5mg  po bid  Anemia Check cbc in am  Depression Cont Zoloft 25mg  po qday  Chronic pain, contracture Cont methadone 5mg  po tid  RLS Cont Requip 0.25mg  po qhs     DVT Prophylaxis-   Eliquis,  SCDs    AM Labs Ordered, also please review Full Orders  Family Communication: Admission, patients condition and plan of care including tests being ordered have been discussed with the patient  who indicate understanding and agree with the plan and Code Status.  Code Status:  FULL CODE per SNF admission sheet as well as patient,    Admission status: Inpatient: Based on patients clinical presentation and evaluation of above clinical data, I have made determination that patient meets Inpatient criteria at this time. Pt has sepsis, and will require iv abx for Hcap, and complicated uti (suprapubic tube).  Pt has high risk of clinical deterioration.  Pt will require > 2 nite stay.   Time spent in minutes :  70 minutes   Pearson Grippe M.D on 12/12/2018 at 10:54 PM

## 2018-12-13 ENCOUNTER — Other Ambulatory Visit: Payer: Self-pay

## 2018-12-13 ENCOUNTER — Encounter (HOSPITAL_COMMUNITY): Payer: Self-pay

## 2018-12-13 DIAGNOSIS — I10 Essential (primary) hypertension: Secondary | ICD-10-CM

## 2018-12-13 LAB — CBC
HCT: 34.2 % — ABNORMAL LOW (ref 39.0–52.0)
Hemoglobin: 11.1 g/dL — ABNORMAL LOW (ref 13.0–17.0)
MCH: 27.9 pg (ref 26.0–34.0)
MCHC: 32.5 g/dL (ref 30.0–36.0)
MCV: 85.9 fL (ref 80.0–100.0)
Platelets: 148 10*3/uL — ABNORMAL LOW (ref 150–400)
RBC: 3.98 MIL/uL — ABNORMAL LOW (ref 4.22–5.81)
RDW: 14.3 % (ref 11.5–15.5)
WBC: 3.2 10*3/uL — ABNORMAL LOW (ref 4.0–10.5)
nRBC: 0 % (ref 0.0–0.2)

## 2018-12-13 LAB — COMPREHENSIVE METABOLIC PANEL
ALT: 21 U/L (ref 0–44)
AST: 20 U/L (ref 15–41)
Albumin: 3.1 g/dL — ABNORMAL LOW (ref 3.5–5.0)
Alkaline Phosphatase: 53 U/L (ref 38–126)
Anion gap: 8 (ref 5–15)
BUN: 20 mg/dL (ref 6–20)
CO2: 25 mmol/L (ref 22–32)
Calcium: 8.3 mg/dL — ABNORMAL LOW (ref 8.9–10.3)
Chloride: 101 mmol/L (ref 98–111)
Creatinine, Ser: 1.18 mg/dL (ref 0.61–1.24)
GFR calc Af Amer: >60 mL/min (ref >60)
GFR calc non Af Amer: >60 mL/min (ref >60)
Glucose, Bld: 107 mg/dL — ABNORMAL HIGH (ref 70–99)
Potassium: 4.4 mmol/L (ref 3.5–5.1)
Sodium: 134 mmol/L — ABNORMAL LOW (ref 135–145)
Total Bilirubin: 0.4 mg/dL (ref 0.3–1.2)
Total Protein: 6.6 g/dL (ref 6.5–8.1)

## 2018-12-13 LAB — GLUCOSE, CAPILLARY
Glucose-Capillary: 102 mg/dL — ABNORMAL HIGH (ref 70–99)
Glucose-Capillary: 121 mg/dL — ABNORMAL HIGH (ref 70–99)
Glucose-Capillary: 128 mg/dL — ABNORMAL HIGH (ref 70–99)
Glucose-Capillary: 133 mg/dL — ABNORMAL HIGH (ref 70–99)
Glucose-Capillary: 145 mg/dL — ABNORMAL HIGH (ref 70–99)

## 2018-12-13 LAB — TROPONIN I (HIGH SENSITIVITY): Troponin I (High Sensitivity): 7 ng/L (ref <18)

## 2018-12-13 LAB — TSH: TSH: 1.597 u[IU]/mL (ref 0.350–4.500)

## 2018-12-13 MED ORDER — ONDANSETRON 4 MG PO TBDP: 4.0000 mg | ORAL_TABLET | ORAL | Status: DC | PRN

## 2018-12-13 MED ORDER — SERTRALINE HCL 50 MG PO TABS
25.0000 mg | ORAL_TABLET | Freq: Every day | ORAL | Status: DC
  Administered 2018-12-15 – 2018-12-16 (×2): 25 mg via ORAL
  Filled 2018-12-13 (×4): qty 1

## 2018-12-13 MED ORDER — ENALAPRIL MALEATE 5 MG PO TABS
2.5000 mg | ORAL_TABLET | Freq: Two times a day (BID) | ORAL | Status: DC
  Administered 2018-12-13 – 2018-12-16 (×5): 2.5 mg via ORAL
  Filled 2018-12-13 (×7): qty 1

## 2018-12-13 MED ORDER — SENNOSIDES-DOCUSATE SODIUM 8.6-50 MG PO TABS
1.0000 | ORAL_TABLET | Freq: Every day | ORAL | Status: DC
  Administered 2018-12-15 – 2018-12-16 (×2): 1 via ORAL
  Filled 2018-12-13 (×3): qty 1

## 2018-12-13 MED ORDER — ADULT MULTIVITAMIN W/MINERALS CH
1.0000 | ORAL_TABLET | Freq: Every day | ORAL | Status: DC
  Administered 2018-12-15 – 2018-12-16 (×2): 1 via ORAL
  Filled 2018-12-13 (×3): qty 1

## 2018-12-13 MED ORDER — MELATONIN 3 MG PO TABS
1.0000 | ORAL_TABLET | Freq: Every day | ORAL | Status: DC
  Administered 2018-12-13 – 2018-12-15 (×3): 3 mg via ORAL
  Filled 2018-12-13 (×3): qty 1

## 2018-12-13 MED ORDER — PRO-STAT SUGAR FREE PO LIQD
30.0000 mL | Freq: Two times a day (BID) | ORAL | Status: DC
  Administered 2018-12-14 – 2018-12-16 (×4): 30 mL via ORAL
  Filled 2018-12-13 (×7): qty 30

## 2018-12-13 MED ORDER — METFORMIN HCL 500 MG PO TABS
1000.0000 mg | ORAL_TABLET | Freq: Two times a day (BID) | ORAL | Status: DC
  Filled 2018-12-13: qty 2

## 2018-12-13 MED ORDER — RISAQUAD PO CAPS
1.0000 | ORAL_CAPSULE | Freq: Every morning | ORAL | Status: DC
  Administered 2018-12-15 – 2018-12-16 (×2): 1 via ORAL
  Filled 2018-12-13 (×3): qty 1

## 2018-12-13 MED ORDER — METHADONE HCL 10 MG PO TABS
5.0000 mg | ORAL_TABLET | Freq: Three times a day (TID) | ORAL | Status: DC
  Administered 2018-12-13 – 2018-12-16 (×7): 5 mg via ORAL
  Filled 2018-12-13 (×10): qty 1

## 2018-12-13 MED ORDER — VITAMIN D 25 MCG (1000 UNIT) PO TABS
2000.0000 [IU] | ORAL_TABLET | Freq: Every day | ORAL | Status: DC
  Administered 2018-12-15 – 2018-12-16 (×2): 2000 [IU] via ORAL
  Filled 2018-12-13 (×4): qty 2

## 2018-12-13 MED ORDER — APIXABAN 5 MG PO TABS
5.0000 mg | ORAL_TABLET | Freq: Two times a day (BID) | ORAL | Status: DC
  Administered 2018-12-13 – 2018-12-16 (×5): 5 mg via ORAL
  Filled 2018-12-13 (×7): qty 1

## 2018-12-13 MED ORDER — ROPINIROLE HCL 0.25 MG PO TABS
0.2500 mg | ORAL_TABLET | Freq: Every day | ORAL | Status: DC
  Administered 2018-12-13 – 2018-12-15 (×3): 0.25 mg via ORAL
  Filled 2018-12-13 (×3): qty 1

## 2018-12-13 MED ORDER — FESOTERODINE FUMARATE ER 4 MG PO TB24
4.0000 mg | ORAL_TABLET | Freq: Every day | ORAL | Status: DC
  Administered 2018-12-15 – 2018-12-16 (×2): 4 mg via ORAL
  Filled 2018-12-13 (×4): qty 1

## 2018-12-13 MED ORDER — FAMOTIDINE 20 MG PO TABS
20.0000 mg | ORAL_TABLET | Freq: Every morning | ORAL | Status: DC
  Administered 2018-12-15 – 2018-12-16 (×2): 20 mg via ORAL
  Filled 2018-12-13 (×4): qty 1

## 2018-12-13 MED ORDER — KETOCONAZOLE 2 % EX CREA
1.0000 "application " | TOPICAL_CREAM | Freq: Every day | CUTANEOUS | Status: DC
  Administered 2018-12-13 – 2018-12-16 (×4): 1 via TOPICAL
  Filled 2018-12-13: qty 15

## 2018-12-13 MED ORDER — VITAMIN C 500 MG PO TABS
500.0000 mg | ORAL_TABLET | Freq: Two times a day (BID) | ORAL | Status: DC
  Administered 2018-12-14 – 2018-12-16 (×4): 500 mg via ORAL
  Filled 2018-12-13 (×7): qty 1

## 2018-12-13 NOTE — Progress Notes (Signed)
Pharmacy Antibiotic Note  Albert Jensen is a 48 y.o. male admitted on 12/12/2018 with sepsis.  Pharmacy has been consulted for vancomycin and cefepime dosing.  Plan: Vancomycin 1000mg  IV every 8 hours.  Goal trough 15-20 mcg/mL. cefepime 2gm iv q8h  Height: 5\' 11"  (180.3 cm) Weight: 238 lb 8.6 oz (108.2 kg) IBW/kg (Calculated) : 75.3  Temp (24hrs), Avg:100.1 F (37.8 C), Min:99 F (37.2 C), Max:101 F (38.3 C)  Recent Labs  Lab 12/12/18 2112 12/12/18 2259 12/13/18 0501  WBC 3.6*  --  3.2*  CREATININE 1.18  --   --   LATICACIDVEN 1.0 0.8  --     Estimated Creatinine Clearance: 97.9 mL/min (by C-G formula based on SCr of 1.18 mg/dL).    No Known Allergies  Antimicrobials this admission: 9/4 vancomycin >>  9/4 cefepime >>    Microbiology results: 9/4 BCx: sent 9/4 UCx: sent    Thank you for allowing pharmacy to be a part of this patient's care.  Donna Christen Margan Elias 12/13/2018 6:12 AM

## 2018-12-13 NOTE — Progress Notes (Signed)
Patient is non-verbal, however patient has refused to take any of his oral medications over night.  Patient raises his thumb for yes and lowers it for no answers.

## 2018-12-13 NOTE — Progress Notes (Signed)
Patient refused PO medications. Cognition confirmed and education completed with patient continuing to refuse all medications except Insulin. MD notified. Elodia Florence RN 12/13/2018

## 2018-12-13 NOTE — Progress Notes (Addendum)
PROGRESS NOTE    Albert Jensen  ZOX:[id number]  DOB: 07/21/1970  DOA: 12/12/2018 PCP: Bernerd LimboAriza, Fernando Enrique, MD   Brief Admission Hx: 48 year old male resident of [place] presented with fever.  He has a history of anoxic brain injury and is nonverbal presented with UTI, pneumonia and sepsis.  MDM/Assessment & Plan:   1. Sepsis secondary to healthcare associated pneumonia -continue supportive therapy and IV antibiotics and follow clinically.  Follow blood cultures. 2. UTI- he does have an indwelling catheter and has been started on IV antibiotics pending urine cultures and blood culture results.  Continue current treatments. 3. Sinus tachycardia-secondary to sepsis, improving with treatments. 4. Type 2 diabetes mellitus-continue sliding scale coverage. 5. Moderate protein calorie malnutrition-continue protein supplements as ordered. 6. Paroxysmal atrial fibrillation-he is fully anticoagulated with apixaban. 7. History of CVA 8. Essential hypertension-continue Vasotec 2.5 mg twice daily. 9. Mild anemia- hemoglobin down to 11 with hydration.  Continue to follow. 10. Depression-he is currently being treated with Zoloft 25 mg daily.  He refused to take his morning medications.  If he continues to refuse to take his medications will ask for a TTS evaluation and palliative medicine evaluation.  11. RLS-continue risperidone 0.25 mg p.o. nightly. 12. Chronic pain - resumed home methadone.    DVT prophylaxis: Apixaban/SCDs Code Status: Full Family Communication: No one at bedside Disposition Plan: Continue inpatient treatments  Consultants:    Procedures:    Antimicrobials:  Cefepime 9/4 >>  Vancomycin 9/4 >>    Subjective: Pt nonverbal, refused to take meds this morning.   Objective: Vitals:   12/12/18 2330 12/13/18 0011 12/13/18 0519 12/13/18 0845  BP: 136/87 (!) 142/90 (!) 145/96 123/81  Pulse: (!) 109 (!) 110 (!) 106   Resp: (!) 26 20 17    Temp:  (!) 101 F  (38.3 C) 99 F (37.2 C)   TempSrc:  Oral Oral   SpO2: 90% 94% 94%   Weight:  101 kg 108.2 kg   Height:  5\' 11"  (1.803 m)      Intake/Output Summary (Last 24 hours) at 12/13/2018 1134 Last data filed at 12/13/2018 0300 Gross per 24 hour  Intake 1102.34 ml  Output -  Net 1102.34 ml   Filed Weights   12/12/18 2124 12/13/18 0011 12/13/18 0519  Weight: 100 kg 101 kg 108.2 kg     REVIEW OF SYSTEMS  As per history otherwise all reviewed and reported negative  Exam:  General exam: chronically ill appearing male lying in bed awake, responsive with blinking yes/no, NAD.  Respiratory system: RLL rales. No increased work of breathing. Cardiovascular system: S1 & S2 heard. No JVD, murmurs, gallops, clicks or pedal edema. Gastrointestinal system: Abdomen is nondistended, soft and nontender. Normal bowel sounds heard. Central nervous system: Alert and oriented. No focal neurological deficits. Extremities: no CCE.  Data Reviewed: Basic Metabolic Panel: Recent Labs  Lab 12/12/18 2112 12/13/18 0501  NA 135 134*  K 4.3 4.4  CL 99 101  CO2 24 25  GLUCOSE 124* 107*  BUN 23* 20  CREATININE 1.18 1.18  CALCIUM 8.7* 8.3*   Liver Function Tests: Recent Labs  Lab 12/12/18 2112 12/13/18 0501  AST 22 20  ALT 25 21  ALKPHOS 61 53  BILITOT 0.6 0.4  PROT 7.2 6.6  ALBUMIN 3.5 3.1*   No results for input(s): LIPASE, AMYLASE in the last 168 hours. No results for input(s): AMMONIA in the last 168 hours. CBC: Recent Labs  Lab 12/12/18 2112 12/13/18 0501  WBC 3.6* 3.2*  NEUTROABS 2.7  --   HGB 12.0* 11.1*  HCT 37.2* 34.2*  MCV 85.9 85.9  PLT 152 148*   Cardiac Enzymes: No results for input(s): CKTOTAL, CKMB, CKMBINDEX, TROPONINI in the last 168 hours. CBG (last 3)  Recent Labs    12/13/18 0014 12/13/18 0715 12/13/18 1117  GLUCAP 145* 121* 102*   Recent Results (from the past 240 hour(s))  Culture, blood (Routine x 2)     Status: None (Preliminary result)   Collection  Time: 12/12/18  9:12 PM   Specimen: Right Antecubital; Blood  Result Value Ref Range Status   Specimen Description RIGHT ANTECUBITAL  Final   Special Requests   Final    BOTTLES DRAWN AEROBIC AND ANAEROBIC Blood Culture adequate volume   Culture   Final    NO GROWTH < 12 HOURS Performed at [facility], [address], [place], [place] [address]    Report Status PENDING  Incomplete  SARS Coronavirus 2 [facility] order, Performed in [place] hospital lab) Nasopharyngeal Nasopharyngeal Swab     Status: None   Collection Time: 12/12/18  9:26 PM   Specimen: Nasopharyngeal Swab  Result Value Ref Range Status   SARS Coronavirus 2 NEGATIVE NEGATIVE Final    Comment: (NOTE) If result is NEGATIVE SARS-CoV-2 target nucleic acids are NOT DETECTED. The SARS-CoV-2 RNA is generally detectable in upper and lower  respiratory specimens during the acute phase of infection. The lowest  concentration of SARS-CoV-2 viral copies this assay can detect is 250  copies / mL. A negative result does not preclude SARS-CoV-2 infection  and should not be used as the sole basis for treatment or other  patient management decisions.  A negative result may occur with  improper specimen collection / handling, submission of specimen other  than nasopharyngeal swab, presence of viral mutation(s) within the  areas targeted by this assay, and inadequate number of viral copies  (<250 copies / mL). A negative result must be combined with clinical  observations, patient history, and epidemiological information. If result is POSITIVE SARS-CoV-2 target nucleic acids are DETECTED. The SARS-CoV-2 RNA is generally detectable in upper and lower  respiratory specimens dur ing the acute phase of infection.  Positive  results are indicative of active infection with SARS-CoV-2.  Clinical  correlation with patient history and other diagnostic information is  necessary to determine patient infection status.  Positive results do   not rule out bacterial infection or co-infection with other viruses. If result is PRESUMPTIVE POSTIVE SARS-CoV-2 nucleic acids MAY BE PRESENT.   A presumptive positive result was obtained on the submitted specimen  and confirmed on repeat testing.  While 2019 novel coronavirus  (SARS-CoV-2) nucleic acids may be present in the submitted sample  additional confirmatory testing may be necessary for epidemiological  and / or clinical management purposes  to differentiate between  SARS-CoV-2 and other Sarbecovirus currently known to infect humans.  If clinically indicated additional testing with an alternate test  methodology [phone]) is advised. The SARS-CoV-2 RNA is generally  detectable in upper and lower respiratory sp ecimens during the acute  phase of infection. The expected result is Negative. Fact Sheet for Patients:  StrictlyIdeas.no Fact Sheet for Healthcare Providers: BankingDealers.co.za This test is not yet approved or cleared by the [place] FDA and has been authorized for detection and/or diagnosis of SARS-CoV-2 by FDA under an Emergency Use Authorization (EUA).  This EUA will remain in effect (meaning this test can be used)  for the duration of the COVID-19 declaration under Section 564(b)(1) of the Act, 21 U.S.C. section 360bbb-3(b)(1), unless the authorization is terminated or revoked sooner. Performed at [facility], [address], [place], [place] [address]   Culture, blood (Routine x 2)     Status: None (Preliminary result)   Collection Time: 12/12/18  9:43 PM   Specimen: BLOOD LEFT ARM  Result Value Ref Range Status   Specimen Description BLOOD LEFT ARM  Final   Special Requests   Final    BOTTLES DRAWN AEROBIC AND ANAEROBIC Blood Culture adequate volume   Culture   Final    NO GROWTH < 12 HOURS Performed at [facility], [address], [place], [place] [address]    Report Status PENDING  Incomplete      Studies: Dg Chest Port 1 View  Result Date: 12/12/2018 CLINICAL DATA:  Sepsis EXAM: PORTABLE CHEST 1 VIEW COMPARISON:  10/20/2017 FINDINGS: Low lung volumes. Patchy bilateral airspace opacities, best seen in the upper lobes and left lower lobe concerning for pneumonia. Heart is borderline in size. No effusions or acute bony abnormality. IMPRESSION: Low lung volumes. Patchy bilateral airspace disease concerning for pneumonia. Electronically Signed   By: Charlett Nose M.D.   On: 12/12/2018 21:50   Dg Abd 2 Views  Result Date: 12/12/2018 CLINICAL DATA:  Code sepsis, fever EXAM: ABDOMEN - 2 VIEW COMPARISON:  02/25/2015, CT 01/16/2018 FINDINGS: No free air beneath the diaphragm. Moderate gastric distension. Overall nonobstructed bowel-gas pattern. Postsurgical changes in the left pelvis. Catheter over the inferior pelvis. Left lower quadrant ostomy. IMPRESSION: Nonobstructed bowel-gas pattern.  Moderate gastric distension Electronically Signed   By: Jasmine Pang M.D.   On: 12/12/2018 22:43     Scheduled Meds: . acidophilus  1 capsule Oral q morning - 10a  . apixaban  5 mg Oral BID  . cholecalciferol  2,000 Units Oral Daily  . enalapril  2.5 mg Oral BID  . famotidine  20 mg Oral q morning - 10a  . feeding supplement (PRO-STAT SUGAR FREE 64)  30 mL Oral BID  . fesoterodine  4 mg Oral Daily  . insulin aspart  0-5 Units Subcutaneous QHS  . insulin aspart  0-9 Units Subcutaneous TID WC  . ketoconazole  1 application Topical Daily  . Melatonin  1 tablet Oral QHS  . metFORMIN  1,000 mg Oral BID WC  . methadone  5 mg Oral Q8H  . multivitamin with minerals  1 tablet Oral Daily  . rOPINIRole  0.25 mg Oral QHS  . senna-docusate  1 tablet Oral Daily  . sertraline  25 mg Oral Daily  . vitamin C  500 mg Oral BID   Continuous Infusions: . ceFEPime (MAXIPIME) IV 2 g (12/13/18 0544)  . vancomycin 1,000 mg (12/13/18 0211)    Principal Problem:   Sepsis (HCC) Active Problems:   Essential hypertension,  benign   Type II diabetes mellitus, uncontrolled (HCC)   HCAP (healthcare-associated pneumonia)   Tachycardia   Acute lower UTI   Time spent:   Standley Dakins, MD [facility] 12/13/2018, 11:34 AM    LOS: 1 day  How to contact the [facility] Attending or Consulting provider 7A - 7P or covering provider during after hours 7P -7A, for this patient?  1. Check the care team in [facility] and look for a) attending/consulting [facility] provider listed and b) the [facility] team listed 2. Log into [email or web address] and use 's universal password to access. If you do not have  the password, please contact the hospital operator. 3. Locate the [facility] provider you are looking for under [facility] and page to a number that you can be directly reached. 4. If you still have difficulty reaching the provider, please page the [facility] (Director on Call) for the Hospitalists listed on amion for assistance.

## 2018-12-13 NOTE — Progress Notes (Signed)
ANTICOAGULATION CONSULT NOTE - Initial Consult  Pharmacy Consult for apixaban Indication: atrial fibrillation  No Known Allergies  Patient Measurements: Height: 5\' 11"  (180.3 cm) Weight: 222 lb 10.6 oz (101 kg) IBW/kg (Calculated) : 75.3 Heparin Dosing Weight: 96.2 kg  Vital Signs: Temp: 101 F (38.3 C) (09/05 0011) Temp Source: Oral (09/05 0011) BP: 142/90 (09/05 0011) Pulse Rate: 110 (09/05 0011)  Labs: Recent Labs    12/12/18 2112 12/12/18 2259  HGB 12.0*  --   HCT 37.2*  --   PLT 152  --   APTT 27  --   LABPROT 13.6  --   INR 1.0  --   CREATININE 1.18  --   TROPONINIHS  --  6    Estimated Creatinine Clearance: 94.7 mL/min (by C-G formula based on SCr of 1.18 mg/dL).   Medical History: Past Medical History:  Diagnosis Date  . Anoxic encephalopathy ([place])   . CAD (coronary artery disease)    DES proximal RCA and aspiration thrombectomy mid RCA and PLB 01/2015  . Cardiac arrest [facility])    October 2016 with STEMI  . Essential hypertension   . Paroxysmal A-fib ([place])   . Respiratory failure ([place])    Prolonged ventilatory course, complicated by mucus plugging and ultimately required tracheostomy 10-02/2015  . RVOT-VT (right ventricular outflow tract ventricular tachycardia) ([place])    Evaluated by Dr. Lovena Le in 2012  . ST elevation myocardial infarction (STEMI) of inferolateral wall [facility])    October 2016   . Stroke ([place])   . Type 2 diabetes mellitus ([place])   . Ventilator associated pneumonia ([place])     Medications:  Medications Prior to Admission  Medication Sig Dispense Refill Last Dose  . acetaminophen (TYLENOL) 325 MG tablet Take 650 mg by mouth every 4 (four) hours as needed for mild pain.   unknonw  . Acidophilus Lactobacillus CAPS Take 1 capsule by mouth every morning.    Past Week at Unknown time  . apixaban (ELIQUIS) 5 MG TABS tablet Take 5 mg by mouth 2 (two) times daily.   Past Week at Unknown time  . Cholecalciferol (VITAMIN D3) 2000 units TABS Take  2,000 Units by mouth daily.   Past Week at Unknown time  . enalapril (VASOTEC) 2.5 MG tablet Take 2.5 mg by mouth 2 (two) times daily.    Past Week at Unknown time  . famotidine (PEPCID) 20 MG tablet Take 20 mg by mouth every morning.   Past Week at Unknown time  . ketoconazole (NIZORAL) 2 % cream Apply 1 application topically daily. To face for dryness   Past Week at Unknown time  . Melatonin 3 MG TABS Take 1 tablet by mouth at bedtime.    Past Week at Unknown time  . metFORMIN (GLUCOPHAGE) 1000 MG tablet Take 1,000 mg by mouth 2 (two) times daily.    12/11/2018 at Unknown time  . methadone (DOLOPHINE) 5 MG tablet Take 5 mg by mouth every 8 (eight) hours.   12/12/2018 at Unknown time  . Multiple Vitamins-Minerals (THERA-M) TABS Take 1 tablet by mouth daily.    Past Week at Unknown time  . ondansetron (ZOFRAN-ODT) 4 MG disintegrating tablet Take 4 mg by mouth every 4 (four) hours as needed for nausea or vomiting.   12/12/2018 at 1700  . rOPINIRole (REQUIP) 0.25 MG tablet Take 0.25 mg by mouth at bedtime.    Past Week at Unknown time  . sennosides-docusate sodium (SENOKOT-S) 8.6-50 MG tablet Take 1 tablet by mouth  daily.   Past Week at Unknown time  . sertraline (ZOLOFT) 25 MG tablet Take 25 mg by mouth daily.   Past Week at Unknown time  . tolterodine (DETROL LA) 2 MG 24 hr capsule Take 2 mg by mouth every morning.   Past Week at Unknown time  . trolamine salicylate (ASPERCREME) 10 % cream Apply 1 application topically 2 (two) times daily.   Past Week at Unknown time  . vitamin C (ASCORBIC ACID) 500 MG tablet Take 1 tablet by mouth 2 (two) times daily.    Past Week at Unknown time  . Amino Acids-Protein Hydrolys (FEEDING SUPPLEMENT, PRO-STAT SUGAR FREE 64,) LIQD Take 30 mLs by mouth 2 (two) times daily.    Not Taking at Unknown time    Assessment:  Albert Jensen  is a 48 y.o. male with PMH including CAD s/p STEMI 2016 w Cardiac arrest w DES to prox RCA and Afib on chronic anticoagulation with apixaban.  Pharmacy has been consulted to dose/monitor apixaban while inpatient. Patient has been refusing medications for a few days per med rec, APTT is WNL so likely includes his apixaban.  Of note: Patient did receive Lovenox dose 50 mg x 1 on 9/5 @ 0030  Goal of Therapy:  Anticoagulation Monitor platelets by anticoagulation protocol: Yes   Plan:  Apixaban 5 mg PO BID Monitor renal function, CBC, and for signs and symptoms of bleeding   Thank you for allowing us to participate in this patients care. Signe Coltonya C Kla Bily, PharmD 12/13/2018,3:28 AM

## 2018-12-14 LAB — GLUCOSE, CAPILLARY
Glucose-Capillary: 85 mg/dL (ref 70–99)
Glucose-Capillary: 132 mg/dL — ABNORMAL HIGH (ref 70–99)
Glucose-Capillary: 99 mg/dL (ref 70–99)
Glucose-Capillary: 86 mg/dL (ref 70–99)

## 2018-12-14 LAB — URINE CULTURE: Culture: >=100000 — AB

## 2018-12-14 LAB — HIV ANTIBODY (ROUTINE TESTING W REFLEX): HIV Screen 4th Generation wRfx: NONREACTIVE

## 2018-12-14 NOTE — Progress Notes (Signed)
PROGRESS NOTE  Albert Jensen  CVE:[id number]  DOB: September 18, 1970  DOA: 12/12/2018 PCP: Bernerd Limbo, MD   Brief Admission Hx: 48 year old male resident of [place] presented with fever.  He has a history of anoxic brain injury and is nonverbal presented with UTI, pneumonia and sepsis.  MDM/Assessment & Plan:   1. Sepsis secondary to healthcare associated pneumonia -continue supportive therapy and IV antibiotics and follow clinically.  Follow blood cultures.  Urine culture indeterminate.  2. UTI- he does have an indwelling catheter and has been started on IV antibiotics pending urine cultures and blood culture results.  Continue current treatments.  Urine culture indeterminate. 3. Sinus tachycardia-secondary to sepsis, improving with treatments. 4. Type 2 diabetes mellitus-continue sliding scale coverage. 5. Moderate protein calorie malnutrition-continue protein supplements as ordered. 6. Paroxysmal atrial fibrillation-he is fully anticoagulated with apixaban. 7. History of CVA 8. Essential hypertension-continue Vasotec 2.5 mg twice daily. 9. Mild anemia- hemoglobin down to 11 with hydration.   10. Depression-he is currently being treated with Zoloft 25 mg daily.   11. RLS-continue risperidone 0.25 mg p.o. nightly. 12. Chronic pain - resumed home methadone.    DVT prophylaxis: Apixaban/SCDs Code Status: Full Family Communication: No one at bedside Disposition Plan: Continue inpatient treatments  Consultants:    Procedures:    Antimicrobials:  Cefepime 9/4 >>  Vancomycin 9/4 >>    Subjective: Pt nonverbal, has been taking his meds a bit more and less refusal, eating breakfast on his own   Objective: Vitals:   12/13/18 2102 12/14/18 0525 12/14/18 0542 12/14/18 0808  BP: 112/71  106/78   Pulse: 85  93   Resp: 16  16   Temp: 99.3 F (37.4 C)  (!) 97.5 F (36.4 C)   TempSrc: Oral  Oral   SpO2: 96%  99% 98%  Weight:  108 kg    Height:         Intake/Output Summary (Last 24 hours) at 12/14/2018 1222 Last data filed at 12/14/2018 0900 Gross per 24 hour  Intake 720 ml  Output 1900 ml  Net -1180 ml   Filed Weights   12/13/18 0011 12/13/18 0519 12/14/18 0525  Weight: 107.5 kg 108.2 kg 108 kg     REVIEW OF SYSTEMS  As per history otherwise all reviewed and reported negative  Exam:  General exam: chronically ill appearing male lying in bed awake, responsive with blinking yes/no, NAD.  Respiratory system: RLL rales. No increased work of breathing. Cardiovascular system: S1 & S2 heard. No JVD, murmurs, gallops, clicks or pedal edema. Gastrointestinal system: Abdomen is nondistended, soft and nontender. Normal bowel sounds heard. Central nervous system: Alert and oriented. No focal neurological deficits. Extremities: no CCE.  Data Reviewed: Basic Metabolic Panel: Recent Labs  Lab 12/12/18 2112 12/13/18 0501  NA 135 134*  K 4.3 4.4  CL 99 101  CO2 24 25  GLUCOSE 124* 107*  BUN 23* 20  CREATININE 1.18 1.18  CALCIUM 8.7* 8.3*   Liver Function Tests: Recent Labs  Lab 12/12/18 2112 12/13/18 0501  AST 22 20  ALT 25 21  ALKPHOS 61 53  BILITOT 0.6 0.4  PROT 7.2 6.6  ALBUMIN 3.5 3.1*   No results for input(s): LIPASE, AMYLASE in the last 168 hours. No results for input(s): AMMONIA in the last 168 hours. CBC: Recent Labs  Lab 12/12/18 2112 12/13/18 0501  WBC 3.6* 3.2*  NEUTROABS 2.7  --   HGB 12.0* 11.1*  HCT 37.2* 34.2*  MCV 85.9 85.9  PLT 152 148*   Cardiac Enzymes: No results for input(s): CKTOTAL, CKMB, CKMBINDEX, TROPONINI in the last 168 hours. CBG (last 3)  Recent Labs    12/13/18 2026 12/14/18 0712 12/14/18 1104  GLUCAP 128* 85 132*   Recent Results (from the past 240 hour(s))  Culture, blood (Routine x 2)     Status: None (Preliminary result)   Collection Time: 12/12/18  9:12 PM   Specimen: Right Antecubital; Blood  Result Value Ref Range Status   Specimen Description RIGHT ANTECUBITAL   Final   Special Requests   Final    BOTTLES DRAWN AEROBIC AND ANAEROBIC Blood Culture adequate volume   Culture   Final    NO GROWTH 2 DAYS Performed at [facility], [address] [place], [place] [address]    Report Status PENDING  Incomplete  SARS Coronavirus 2 [facility](Hospital order, Performed in [facility] hospital lab) Nasopharyngeal Nasopharyngeal Swab     Status: None   Collection Time: 12/12/18  9:26 PM   Specimen: Nasopharyngeal Swab  Result Value Ref Range Status   SARS Coronavirus 2 NEGATIVE NEGATIVE Final    Comment: (NOTE) If result is NEGATIVE SARS-CoV-2 target nucleic acids are NOT DETECTED. The SARS-CoV-2 RNA is generally detectable in upper and lower  respiratory specimens during the acute phase of infection. The lowest  concentration of SARS-CoV-2 viral copies this assay can detect is 250  copies / mL. A negative result does not preclude SARS-CoV-2 infection  and should not be used as the sole basis for treatment or other  patient management decisions.  A negative result may occur with  improper specimen collection / handling, submission of specimen other  than nasopharyngeal swab, presence of viral mutation(s) within the  areas targeted by this assay, and inadequate number of viral copies  (<250 copies / mL). A negative result must be combined with clinical  observations, patient history, and epidemiological information. If result is POSITIVE SARS-CoV-2 target nucleic acids are DETECTED. The SARS-CoV-2 RNA is generally detectable in upper and lower  respiratory specimens dur ing the acute phase of infection.  Positive  results are indicative of active infection with SARS-CoV-2.  Clinical  correlation with patient history and other diagnostic information is  necessary to determine patient infection status.  Positive results do  not rule out bacterial infection or co-infection with other viruses. If result is PRESUMPTIVE POSTIVE SARS-CoV-2 nucleic acids MAY BE  PRESENT.   A presumptive positive result was obtained on the submitted specimen  and confirmed on repeat testing.  While 2019 novel coronavirus  (SARS-CoV-2) nucleic acids may be present in the submitted sample  additional confirmatory testing may be necessary for epidemiological  and / or clinical management purposes  to differentiate between  SARS-CoV-2 and other Sarbecovirus currently known to infect humans.  If clinically indicated additional testing with an alternate test  methodology [phone]([id number]) is advised. The SARS-CoV-2 RNA is generally  detectable in upper and lower respiratory sp ecimens during the acute  phase of infection. The expected result is Negative. Fact Sheet for Patients:  BoilerBrush.com.cyhttps://www.fda.gov/media/136312/download Fact Sheet for Healthcare Providers: https://pope.com/https://www.fda.gov/media/136313/download This test is not yet approved or cleared by the [place] FDA and has been authorized for detection and/or diagnosis of SARS-CoV-2 by FDA under an Emergency Use Authorization (EUA).  This EUA will remain in effect (meaning this test can be used) for the duration of the COVID-19 declaration under Section 564(b)(1) of the Act, 21 U.S.C. section 360bbb-3(b)(1), unless the authorization is terminated or  revoked sooner. Performed at [facility], [address], [place], [place] [address]   Culture, blood (Routine x 2)     Status: None (Preliminary result)   Collection Time: 12/12/18  9:43 PM   Specimen: BLOOD LEFT ARM  Result Value Ref Range Status   Specimen Description BLOOD LEFT ARM  Final   Special Requests   Final    BOTTLES DRAWN AEROBIC AND ANAEROBIC Blood Culture adequate volume   Culture   Final    NO GROWTH 2 DAYS Performed at [facility], [address], [place], [place] [address]    Report Status PENDING  Incomplete  Urine culture     Status: Abnormal   Collection Time: 12/12/18  9:45 PM   Specimen: In/Out Cath Urine  Result Value Ref Range Status   Specimen  Description   Final    IN/OUT CATH URINE Performed at [facility], [address], [place], [place] [address]    Special Requests   Final    NONE Performed at [facility], [address], [place], [place] [address]    Culture (A)  Final    >=100,000 COLONIES/mL MULTIPLE SPECIES PRESENT, SUGGEST RECOLLECTION   Report Status 12/14/2018 FINAL  Final     Studies: Dg Chest Port 1 View  Result Date: 12/12/2018 CLINICAL DATA:  Sepsis EXAM: PORTABLE CHEST 1 VIEW COMPARISON:  10/20/2017 FINDINGS: Low lung volumes. Patchy bilateral airspace opacities, best seen in the upper lobes and left lower lobe concerning for pneumonia. Heart is borderline in size. No effusions or acute bony abnormality. IMPRESSION: Low lung volumes. Patchy bilateral airspace disease concerning for pneumonia. Electronically Signed   By: Rolm Baptise M.D.   On: 12/12/2018 21:50   Dg Abd 2 Views  Result Date: 12/12/2018 CLINICAL DATA:  Code sepsis, fever EXAM: ABDOMEN - 2 VIEW COMPARISON:  02/25/2015, CT 01/16/2018 FINDINGS: No free air beneath the diaphragm. Moderate gastric distension. Overall nonobstructed bowel-gas pattern. Postsurgical changes in the left pelvis. Catheter over the inferior pelvis. Left lower quadrant ostomy. IMPRESSION: Nonobstructed bowel-gas pattern.  Moderate gastric distension Electronically Signed   By: Donavan Foil M.D.   On: 12/12/2018 22:43   Scheduled Meds: . acidophilus  1 capsule Oral q morning - 10a  . apixaban  5 mg Oral BID  . cholecalciferol  2,000 Units Oral Daily  . enalapril  2.5 mg Oral BID  . famotidine  20 mg Oral q morning - 10a  . feeding supplement (PRO-STAT SUGAR FREE 64)  30 mL Oral BID  . fesoterodine  4 mg Oral Daily  . insulin aspart  0-5 Units Subcutaneous QHS  . insulin aspart  0-9 Units Subcutaneous TID WC  . ketoconazole  1 application Topical Daily  . Melatonin  1 tablet Oral QHS  . methadone  5 mg Oral Q8H  . multivitamin with minerals  1 tablet Oral Daily  .  rOPINIRole  0.25 mg Oral QHS  . senna-docusate  1 tablet Oral Daily  . sertraline  25 mg Oral Daily  . vitamin C  500 mg Oral BID   Continuous Infusions: . ceFEPime (MAXIPIME) IV 2 g (12/14/18 0550)  . vancomycin 1,000 mg (12/14/18 0924)    Principal Problem:   Sepsis ([place]) Active Problems:   Essential hypertension, benign   Type II diabetes mellitus, uncontrolled ([place])   HCAP (healthcare-associated pneumonia)   Tachycardia   Acute lower UTI  Time spent:   Irwin Brakeman, MD [facility] 12/14/2018, 12:22 PM    LOS:  2 days  How to contact the [facility] Attending or Consulting provider [place] or covering provider during after hours [place], for this patient?  1. Check the care team in [facility] and look for a) attending/consulting TRH provider listed and b) the [facility] team listed 2. Log into [email or web address] and use [place]'s universal password to access. If you do not have the password, please contact the hospital operator. 3. Locate the [facility] provider you are looking for under [facility] and page to a number that you can be directly reached. 4. If you still have difficulty reaching the provider, please page the [facility] (Director on Call) for the Hospitalists listed on amion for assistance.

## 2018-12-15 LAB — CBC WITH DIFFERENTIAL/PLATELET
Abs Immature Granulocytes: 0.01 10*3/uL (ref 0.00–0.07)
Basophils Absolute: 0 10*3/uL (ref 0.0–0.1)
Basophils Relative: 0 %
Eosinophils Absolute: 0.1 10*3/uL (ref 0.0–0.5)
Eosinophils Relative: 3 %
HCT: 30.6 % — ABNORMAL LOW (ref 39.0–52.0)
Hemoglobin: 9.8 g/dL — ABNORMAL LOW (ref 13.0–17.0)
Immature Granulocytes: 0 %
Lymphocytes Relative: 30 %
Lymphs Abs: 0.9 10*3/uL (ref 0.7–4.0)
MCH: 28.1 pg (ref 26.0–34.0)
MCHC: 32 g/dL (ref 30.0–36.0)
MCV: 87.7 fL (ref 80.0–100.0)
Monocytes Absolute: 0.3 10*3/uL (ref 0.1–1.0)
Monocytes Relative: 10 %
Neutro Abs: 1.7 10*3/uL (ref 1.7–7.7)
Neutrophils Relative %: 57 %
Platelets: 143 10*3/uL — ABNORMAL LOW (ref 150–400)
RBC: 3.49 MIL/uL — ABNORMAL LOW (ref 4.22–5.81)
RDW: 14.4 % (ref 11.5–15.5)
WBC: 3 10*3/uL — ABNORMAL LOW (ref 4.0–10.5)
nRBC: 0 % (ref 0.0–0.2)

## 2018-12-15 LAB — COMPREHENSIVE METABOLIC PANEL
ALT: 18 U/L (ref 0–44)
AST: 16 U/L (ref 15–41)
Albumin: 2.9 g/dL — ABNORMAL LOW (ref 3.5–5.0)
Alkaline Phosphatase: 49 U/L (ref 38–126)
Anion gap: 7 (ref 5–15)
BUN: 16 mg/dL (ref 6–20)
CO2: 24 mmol/L (ref 22–32)
Calcium: 8.5 mg/dL — ABNORMAL LOW (ref 8.9–10.3)
Chloride: 104 mmol/L (ref 98–111)
Creatinine, Ser: 0.91 mg/dL (ref 0.61–1.24)
GFR calc Af Amer: >60 mL/min (ref >60)
GFR calc non Af Amer: >60 mL/min (ref >60)
Glucose, Bld: 91 mg/dL (ref 70–99)
Potassium: 4 mmol/L (ref 3.5–5.1)
Sodium: 135 mmol/L (ref 135–145)
Total Bilirubin: 0.7 mg/dL (ref 0.3–1.2)
Total Protein: 6.3 g/dL — ABNORMAL LOW (ref 6.5–8.1)

## 2018-12-15 LAB — GLUCOSE, CAPILLARY
Glucose-Capillary: 91 mg/dL (ref 70–99)
Glucose-Capillary: 105 mg/dL — ABNORMAL HIGH (ref 70–99)
Glucose-Capillary: 119 mg/dL — ABNORMAL HIGH (ref 70–99)
Glucose-Capillary: 107 mg/dL — ABNORMAL HIGH (ref 70–99)

## 2018-12-15 LAB — VANCOMYCIN, TROUGH: Vancomycin Tr: 34 ug/mL (ref 15–20)

## 2018-12-15 MED ORDER — SODIUM CHLORIDE 0.9 % IV SOLN
INTRAVENOUS | Status: DC
  Administered 2018-12-15: 10:00:00 via INTRAVENOUS

## 2018-12-15 NOTE — Progress Notes (Signed)
CRITICAL VALUE ALERT  Critical Value:  Vanc 34  Date & Time Notied:  12/15/2018 07:40  Provider Notified: Dr. Wynetta Emery  Orders Received/Actions taken: No new orders at this time.

## 2018-12-15 NOTE — Care Management Important Message (Signed)
Important Message  Patient Details  Name: Albert Jensen MRN: [id number] Date of Birth: 02-17-1971   Medicare Important Message Given:  Yes     Tommy Medal 12/15/2018, 11:50 AM

## 2018-12-15 NOTE — Progress Notes (Signed)
PROGRESS NOTE  Albert Jensen  CLE:[id number]  DOB: 05-20-1970  DOA: 12/12/2018 PCP: Hilbert Corrigan, MD   Brief Admission Hx: 48 year old male resident of [place] presented with fever.  He has a history of anoxic brain injury and is nonverbal presented with UTI, pneumonia and sepsis.  MDM/Assessment & Plan:   1. Sepsis secondary to healthcare associated pneumonia -continue supportive therapy and IV antibiotics and follow clinically.  Follow blood cultures.  Urine culture indeterminate.  2. UTI- he does have an indwelling catheter and has been started on IV antibiotics pending urine cultures and blood culture results.  Continue current treatments.  Urine culture indeterminate. 3. Sinus tachycardia-secondary to sepsis, improving with treatments. 4. Type 2 diabetes mellitus-continue sliding scale coverage. 5. Moderate protein calorie malnutrition-continue protein supplements as ordered. 6. Paroxysmal atrial fibrillation-he is fully anticoagulated with apixaban. 7. History of CVA - stable.  8. Essential hypertension-continue Vasotec 2.5 mg twice daily. 9. Mild anemia- hemoglobin down to 11 with hydration.   10. Depression-he is currently being treated with Zoloft 25 mg daily.   11. RLS-continue risperidone 0.25 mg p.o. nightly. 12. Chronic pain - resumed home methadone.   DVT prophylaxis: Apixaban/SCDs Code Status: Full Family Communication: No one at bedside Disposition Plan: Continue inpatient treatments  Consultants:    Procedures:    Antimicrobials:  Cefepime 9/4 >>  Vancomycin 9/4 >>  Subjective: Pt is nonverbal.  He is eating and drinking well this morning.    Objective: Vitals:   12/14/18 2016 12/14/18 2156 12/15/18 0600 12/15/18 1300  BP:  (!) 126/99 108/73 102/70  Pulse: 92 97 94 95  Resp: 16 19  17   Temp:  99.1 F (37.3 C) 98.9 F (37.2 C) 99.1 F (37.3 C)  TempSrc:  Oral Oral Oral  SpO2: 98% 100% 100% 100%  Weight:   104.5 kg   Height:         Intake/Output Summary (Last 24 hours) at 12/15/2018 1415 Last data filed at 12/15/2018 1300 Gross per 24 hour  Intake 720 ml  Output 1750 ml  Net -1030 ml   Filed Weights   12/13/18 0519 12/14/18 0525 12/15/18 0600  Weight: 108.2 kg 108 kg 104.5 kg   REVIEW OF SYSTEMS  UTO due to condition  Exam:  General exam: chronically ill appearing male lying in bed awake, responsive with blinking yes/no, NAD.  Respiratory system:  No increased work of breathing. Cardiovascular system: S1 & S2 heard. No JVD, murmurs, gallops, clicks or pedal edema. Gastrointestinal system: Abdomen is nondistended, soft and nontender. Normal bowel sounds heard. Central nervous system: Alert and oriented. No focal neurological deficits. Extremities: no CCE.  Data Reviewed: Basic Metabolic Panel: Recent Labs  Lab 12/12/18 2112 12/13/18 0501 12/15/18 0624  NA 135 134* 135  K 4.3 4.4 4.0  CL 99 101 104  CO2 24 25 24   GLUCOSE 124* 107* 91  BUN 23* 20 16  CREATININE 1.18 1.18 0.91  CALCIUM 8.7* 8.3* 8.5*   Liver Function Tests: Recent Labs  Lab 12/12/18 2112 12/13/18 0501 12/15/18 0624  AST 22 20 16   ALT 25 21 18   ALKPHOS 61 53 49  BILITOT 0.6 0.4 0.7  PROT 7.2 6.6 6.3*  ALBUMIN 3.5 3.1* 2.9*   No results for input(s): LIPASE, AMYLASE in the last 168 hours. No results for input(s): AMMONIA in the last 168 hours. CBC: Recent Labs  Lab 12/12/18 2112 12/13/18 0501 12/15/18 0624  WBC 3.6* 3.2* 3.0*  NEUTROABS 2.7  --  1.7  HGB 12.0* 11.1* 9.8*  HCT 37.2* 34.2* 30.6*  MCV 85.9 85.9 87.7  PLT 152 148* 143*   Cardiac Enzymes: No results for input(s): CKTOTAL, CKMB, CKMBINDEX, TROPONINI in the last 168 hours. CBG (last 3)  Recent Labs    12/14/18 2159 12/15/18 0728 12/15/18 1110  GLUCAP 86 91 105*   Recent Results (from the past 240 hour(s))  Culture, blood (Routine x 2)     Status: None (Preliminary result)   Collection Time: 12/12/18  9:12 PM   Specimen: Right Antecubital;  Blood  Result Value Ref Range Status   Specimen Description RIGHT ANTECUBITAL  Final   Special Requests   Final    BOTTLES DRAWN AEROBIC AND ANAEROBIC Blood Culture adequate volume   Culture   Final    NO GROWTH 3 DAYS Performed at [facility], [address] [place], [place] [address]    Report Status PENDING  Incomplete  SARS Coronavirus 2 [facility] order, Performed in [facility] hospital lab) Nasopharyngeal Nasopharyngeal Swab     Status: None   Collection Time: 12/12/18  9:26 PM   Specimen: Nasopharyngeal Swab  Result Value Ref Range Status   SARS Coronavirus 2 NEGATIVE NEGATIVE Final    Comment: (NOTE) If result is NEGATIVE SARS-CoV-2 target nucleic acids are NOT DETECTED. The SARS-CoV-2 RNA is generally detectable in upper and lower  respiratory specimens during the acute phase of infection. The lowest  concentration of SARS-CoV-2 viral copies this assay can detect is 250  copies / mL. A negative result does not preclude SARS-CoV-2 infection  and should not be used as the sole basis for treatment or other  patient management decisions.  A negative result may occur with  improper specimen collection / handling, submission of specimen other  than nasopharyngeal swab, presence of viral mutation(s) within the  areas targeted by this assay, and inadequate number of viral copies  (<250 copies / mL). A negative result must be combined with clinical  observations, patient history, and epidemiological information. If result is POSITIVE SARS-CoV-2 target nucleic acids are DETECTED. The SARS-CoV-2 RNA is generally detectable in upper and lower  respiratory specimens dur ing the acute phase of infection.  Positive  results are indicative of active infection with SARS-CoV-2.  Clinical  correlation with patient history and other diagnostic information is  necessary to determine patient infection status.  Positive results do  not rule out bacterial infection or co-infection with other  viruses. If result is PRESUMPTIVE POSTIVE SARS-CoV-2 nucleic acids MAY BE PRESENT.   A presumptive positive result was obtained on the submitted specimen  and confirmed on repeat testing.  While 2019 novel coronavirus  (SARS-CoV-2) nucleic acids may be present in the submitted sample  additional confirmatory testing may be necessary for epidemiological  and / or clinical management purposes  to differentiate between  SARS-CoV-2 and other Sarbecovirus currently known to infect humans.  If clinically indicated additional testing with an alternate test  methodology [phone]) is advised. The SARS-CoV-2 RNA is generally  detectable in upper and lower respiratory sp ecimens during the acute  phase of infection. The expected result is Negative. Fact Sheet for Patients:  BoilerBrush.com.cy Fact Sheet for Healthcare Providers: https://pope.com/ This test is not yet approved or cleared by the [place] FDA and has been authorized for detection and/or diagnosis of SARS-CoV-2 by FDA under an Emergency Use Authorization (EUA).  This EUA will remain in effect (meaning this test can be used) for the duration of the COVID-19 declaration  under Section 564(b)(1) of the Act, 21 U.S.C. section 360bbb-3(b)(1), unless the authorization is terminated or revoked sooner. Performed at [facility], [address] [place], [place] [address]   Culture, blood (Routine x 2)     Status: None (Preliminary result)   Collection Time: 12/12/18  9:43 PM   Specimen: BLOOD LEFT ARM  Result Value Ref Range Status   Specimen Description BLOOD LEFT ARM  Final   Special Requests   Final    BOTTLES DRAWN AEROBIC AND ANAEROBIC Blood Culture adequate volume   Culture   Final    NO GROWTH 3 DAYS Performed at [facility], [address] [place], [place] [address]    Report Status PENDING  Incomplete  Urine culture     Status: Abnormal   Collection Time: 12/12/18  9:45 PM    Specimen: In/Out Cath Urine  Result Value Ref Range Status   Specimen Description   Final    IN/OUT CATH URINE Performed at [facility], [address] [place], [place] [address]    Special Requests   Final    NONE Performed at [facility], [address] [place], [place] [address]    Culture (A)  Final    >=100,000 COLONIES/mL MULTIPLE SPECIES PRESENT, SUGGEST RECOLLECTION   Report Status 12/14/2018 FINAL  Final     Studies: No results found. Scheduled Meds: . acidophilus  1 capsule Oral q morning - 10a  . apixaban  5 mg Oral BID  . cholecalciferol  2,000 Units Oral Daily  . enalapril  2.5 mg Oral BID  . famotidine  20 mg Oral q morning - 10a  . feeding supplement (PRO-STAT SUGAR FREE 64)  30 mL Oral BID  . fesoterodine  4 mg Oral Daily  . insulin aspart  0-5 Units Subcutaneous QHS  . insulin aspart  0-9 Units Subcutaneous TID WC  . ketoconazole  1 application Topical Daily  . Melatonin  1 tablet Oral QHS  . methadone  5 mg Oral Q8H  . multivitamin with minerals  1 tablet Oral Daily  . rOPINIRole  0.25 mg Oral QHS  . senna-docusate  1 tablet Oral Daily  . sertraline  25 mg Oral Daily  . vitamin C  500 mg Oral BID   Continuous Infusions: . sodium chloride 50 mL/hr at 12/15/18 0947  . ceFEPime (MAXIPIME) IV 2 g (12/15/18 1349)   Principal Problem:   Sepsis ([facility]) Active Problems:   Essential hypertension, benign   Type II diabetes mellitus, uncontrolled ([facility])   HCAP (healthcare-associated pneumonia)   Tachycardia   Acute lower UTI  Time spent:   Standley Dakinslanford Johnson, MD [facility] 12/15/2018, 2:15 PM    LOS: 3 days  How to contact the [facility] Attending or Consulting provider 7A - 7P or covering provider during after hours 7P -7A, for this patient?  1. Check the care team in [facility] and look for a) attending/consulting TRH provider listed and b) the [facility] team listed 2. Log into [email or web address] and use [place]'s universal password to access. If you do not have the  password, please contact the hospital operator. 3. Locate the [facility] provider you are looking for under [facility] and page to a number that you can be directly reached. 4. If you still have difficulty reaching the provider, please page the [facility] (Director on Call) for the Hospitalists listed on amion for assistance.

## 2018-12-16 DIAGNOSIS — E1140 Type 2 diabetes mellitus with diabetic neuropathy, unspecified: Secondary | ICD-10-CM | POA: Diagnosis not present

## 2018-12-16 DIAGNOSIS — R4182 Altered mental status, unspecified: Secondary | ICD-10-CM | POA: Diagnosis not present

## 2018-12-16 DIAGNOSIS — N319 Neuromuscular dysfunction of bladder, unspecified: Secondary | ICD-10-CM | POA: Diagnosis not present

## 2018-12-16 DIAGNOSIS — F339 Major depressive disorder, recurrent, unspecified: Secondary | ICD-10-CM | POA: Diagnosis not present

## 2018-12-16 DIAGNOSIS — Z1383 Encounter for screening for respiratory disorder NEC: Secondary | ICD-10-CM | POA: Diagnosis not present

## 2018-12-16 DIAGNOSIS — R9431 Abnormal electrocardiogram [ECG] [EKG]: Secondary | ICD-10-CM | POA: Diagnosis not present

## 2018-12-16 DIAGNOSIS — Z7401 Bed confinement status: Secondary | ICD-10-CM | POA: Diagnosis not present

## 2018-12-16 DIAGNOSIS — G2581 Restless legs syndrome: Secondary | ICD-10-CM | POA: Diagnosis not present

## 2018-12-16 DIAGNOSIS — I2510 Atherosclerotic heart disease of native coronary artery without angina pectoris: Secondary | ICD-10-CM | POA: Diagnosis not present

## 2018-12-16 DIAGNOSIS — R1110 Vomiting, unspecified: Secondary | ICD-10-CM | POA: Diagnosis not present

## 2018-12-16 DIAGNOSIS — E1151 Type 2 diabetes mellitus with diabetic peripheral angiopathy without gangrene: Secondary | ICD-10-CM | POA: Diagnosis not present

## 2018-12-16 DIAGNOSIS — R000 Tachycardia, unspecified: Secondary | ICD-10-CM | POA: Diagnosis not present

## 2018-12-16 DIAGNOSIS — N189 Chronic kidney disease, unspecified: Secondary | ICD-10-CM | POA: Diagnosis not present

## 2018-12-16 DIAGNOSIS — M2042 Other hammer toe(s) (acquired), left foot: Secondary | ICD-10-CM | POA: Diagnosis not present

## 2018-12-16 DIAGNOSIS — Z7984 Long term (current) use of oral hypoglycemic drugs: Secondary | ICD-10-CM | POA: Diagnosis not present

## 2018-12-16 DIAGNOSIS — Z89411 Acquired absence of right great toe: Secondary | ICD-10-CM | POA: Diagnosis not present

## 2018-12-16 DIAGNOSIS — N390 Urinary tract infection, site not specified: Secondary | ICD-10-CM | POA: Diagnosis not present

## 2018-12-16 DIAGNOSIS — Z5320 Procedure and treatment not carried out because of patient's decision for unspecified reasons: Secondary | ICD-10-CM | POA: Diagnosis not present

## 2018-12-16 DIAGNOSIS — I10 Essential (primary) hypertension: Secondary | ICD-10-CM | POA: Diagnosis not present

## 2018-12-16 DIAGNOSIS — I69320 Aphasia following cerebral infarction: Secondary | ICD-10-CM | POA: Diagnosis not present

## 2018-12-16 DIAGNOSIS — M2041 Other hammer toe(s) (acquired), right foot: Secondary | ICD-10-CM | POA: Diagnosis not present

## 2018-12-16 DIAGNOSIS — E440 Moderate protein-calorie malnutrition: Secondary | ICD-10-CM | POA: Diagnosis not present

## 2018-12-16 DIAGNOSIS — R634 Abnormal weight loss: Secondary | ICD-10-CM | POA: Diagnosis not present

## 2018-12-16 DIAGNOSIS — D638 Anemia in other chronic diseases classified elsewhere: Secondary | ICD-10-CM | POA: Diagnosis not present

## 2018-12-16 DIAGNOSIS — B351 Tinea unguium: Secondary | ICD-10-CM | POA: Diagnosis not present

## 2018-12-16 DIAGNOSIS — Z20828 Contact with and (suspected) exposure to other viral communicable diseases: Secondary | ICD-10-CM | POA: Diagnosis not present

## 2018-12-16 DIAGNOSIS — I6389 Other cerebral infarction: Secondary | ICD-10-CM | POA: Diagnosis not present

## 2018-12-16 DIAGNOSIS — A419 Sepsis, unspecified organism: Secondary | ICD-10-CM | POA: Diagnosis not present

## 2018-12-16 DIAGNOSIS — F331 Major depressive disorder, recurrent, moderate: Secondary | ICD-10-CM | POA: Diagnosis not present

## 2018-12-16 DIAGNOSIS — I959 Hypotension, unspecified: Secondary | ICD-10-CM | POA: Diagnosis not present

## 2018-12-16 DIAGNOSIS — L89153 Pressure ulcer of sacral region, stage 3: Secondary | ICD-10-CM | POA: Diagnosis not present

## 2018-12-16 DIAGNOSIS — L02215 Cutaneous abscess of perineum: Secondary | ICD-10-CM | POA: Diagnosis not present

## 2018-12-16 DIAGNOSIS — H2513 Age-related nuclear cataract, bilateral: Secondary | ICD-10-CM | POA: Diagnosis not present

## 2018-12-16 DIAGNOSIS — R531 Weakness: Secondary | ICD-10-CM | POA: Diagnosis not present

## 2018-12-16 DIAGNOSIS — I252 Old myocardial infarction: Secondary | ICD-10-CM | POA: Diagnosis not present

## 2018-12-16 DIAGNOSIS — I480 Paroxysmal atrial fibrillation: Secondary | ICD-10-CM | POA: Diagnosis not present

## 2018-12-16 DIAGNOSIS — G894 Chronic pain syndrome: Secondary | ICD-10-CM | POA: Diagnosis not present

## 2018-12-16 DIAGNOSIS — M6281 Muscle weakness (generalized): Secondary | ICD-10-CM | POA: Diagnosis not present

## 2018-12-16 DIAGNOSIS — J189 Pneumonia, unspecified organism: Secondary | ICD-10-CM | POA: Diagnosis not present

## 2018-12-16 DIAGNOSIS — M25559 Pain in unspecified hip: Secondary | ICD-10-CM | POA: Diagnosis not present

## 2018-12-16 DIAGNOSIS — E119 Type 2 diabetes mellitus without complications: Secondary | ICD-10-CM | POA: Diagnosis not present

## 2018-12-16 LAB — SARS CORONAVIRUS 2 BY RT PCR (HOSPITAL ORDER, PERFORMED IN ~~LOC~~ HOSPITAL LAB): SARS Coronavirus 2: NEGATIVE

## 2018-12-16 LAB — GLUCOSE, CAPILLARY
Glucose-Capillary: 88 mg/dL (ref 70–99)
Glucose-Capillary: 123 mg/dL — ABNORMAL HIGH (ref 70–99)

## 2018-12-16 MED ORDER — DOXYCYCLINE HYCLATE 100 MG PO CAPS: 100.0000 mg | ORAL_CAPSULE | Freq: Two times a day (BID) | ORAL | 0 refills | Status: AC

## 2018-12-16 NOTE — TOC Transition Note (Signed)
Transition of Care [facility]) - CM/SW Discharge Note   Patient Details  Name: Arav Bannister MRN: [id number] Date of Birth: [birth year]  Transition of Care [facility]) CM/SW Contact:  Kare Dado, Chauncey Reading, RN Phone Number: 12/16/2018, 1:09 PM   Clinical Narrative:   Patient a resident at [facility]. Patient cleared for DC back, Lynnae Sandhoff has received DC clinicals. EMS arranged. Family updated. RN to call report.           Discharge Placement              Patient chooses bed at: (Resident at [facility]) Patient to be transferred to facility by: EMS Name of family member notified: Garrin Kirwan Patient and family notified of of transfer: 12/16/18     Readmission Risk Interventions No flowsheet data found.

## 2018-12-16 NOTE — Progress Notes (Signed)
Pharmacy Antibiotic Note  Albert Jensen is a 48 y.o. male admitted on 12/12/2018 with pneumonia/UTI.  Pharmacy has been consulted for  cefepime dosing.  Plan: Cefepime 2000 mg IV every 8 hours. Monitor labs, c/s, and patient improvement.  Height: 5\' 11"  (180.3 cm) Weight: 235 lb 10.8 oz (106.9 kg) IBW/kg (Calculated) : 75.3  Temp (24hrs), Avg:98.9 F (37.2 C), Min:98.6 F (37 C), Max:99.1 F (37.3 C)  Recent Labs  Lab 12/12/18 2112 12/12/18 2259 12/13/18 0501 12/15/18 0624  WBC 3.6*  --  3.2* 3.0*  CREATININE 1.18  --  1.18 0.91  LATICACIDVEN 1.0 0.8  --   --   VANCOTROUGH  --   --   --  34*    Estimated Creatinine Clearance: 126.1 mL/min (by C-G formula based on SCr of 0.91 mg/dL).    No Known Allergies  Antimicrobials this admission: vancomycin 9/4>> 9/7 cefepime 9/4 >>    Microbiology results: 9/4 BCx: ngtd 9/4 UCx: multiple species- suggest recollection   Thank you for allowing pharmacy to be a part of this patient's care.  Ramond Craver 12/16/2018 8:51 AM

## 2018-12-16 NOTE — Discharge Summary (Signed)
Physician Discharge Summary  Haydn Hutsell VVO:[id number] DOB: 1970/05/08 DOA: 12/12/2018  PCP: Hilbert Corrigan, MD  Admit date: 12/12/2018 Discharge date: 12/16/2018  Admitted From:  SNF    Disposition: SNF  Recommendations for Outpatient Follow-up:  1. Follow up with PCP in 5-7 days  Discharge Condition: STABLE   CODE STATUS: FULL    Brief Hospitalization Summary: Please see all hospital notes, images, labs for full details of the hospitalization. HPI Dr. Maudie Mercury:  Sherrick Araki  is a 48 y.o. male, w Hypertension, Dm2, RVOT-VT 2012, CAD s/p STEMI 2016 w Cardiac arrest w DES to prox RCA  , apparently presents from [facility] with fever for several days. Pt is unable to provide history.   In ED,  T 100.4 P 121, R 26, Bp 130/92  Pox 93% on RA Wt 100kg    CXR IMPRESSION: Low lung volumes. Patchy bilateral airspace disease concerning for pneumonia.  Abdomen Xray IMPRESSION: Nonobstructed bowel-gas pattern. Moderate gastric distension  Na 135, K 4.3,  Bun 23, Creatinine 1.18 Alb 3.5,  Ast 22, Alt 25 Wbc 3.6, Hgb 12.0, Plt 152 Lactic acid 1.0 INR 1.0  Urinalysis wbc >50,  Rbc 21-50  Blood culture x2 pending  Covid -19 negative  Pt given vanco iv, cefepime iv in ED.   Pt will be admitted for sepsis (fever tachycardia, tachypnea)  Brief Admission Hx: 48 year old male resident of [place] presented with fever.  He has a history of anoxic brain injury and is nonverbal presented with UTI, pneumonia and sepsis.  MDM/Assessment & Plan:   1. Sepsis secondary to healthcare associated pneumonia -continue supportive therapy and IV antibiotics and follow clinically.  Followed blood cultures.  Urine culture indeterminate.  DC on oral doxycycline.  2. UTI- RULED OUT.  He does have an indwelling catheter and was initially started on IV antibiotics pending urine cultures and blood culture results.  However urine culture was indeterminate. 3. Sinus tachycardia-Resolved.   Secondary to sepsis, improving with treatments. 4. Type 2 diabetes mellitus-resume prior treatments. 5. Moderate protein calorie malnutrition-continue protein supplements as ordered. 6. Paroxysmal atrial fibrillation-he is fully anticoagulated with apixaban.  HR has been well controlled and stable.  7. History of CVA - stable.  8. Essential hypertension-continue Vasotec 2.5 mg twice daily. 9. Mild anemia- hemoglobin down to 11 with hydration.   10. Depression-he is currently being treated with Zoloft 25 mg daily.   11. RLS-continue risperidone 0.25 mg p.o. nightly. 12. Chronic pain - resumed home methadone.   DVT prophylaxis: Apixaban/SCDs Code Status: Full Family Communication: updated family by telephone and made them aware that he was discharging today Disposition Plan: SNF   Consultants:    Procedures:    Antimicrobials:  Cefepime 9/4 >>  Vancomycin 9/4 >>  Discharge Diagnoses:  Principal Problem:   Sepsis ([place]) Active Problems:   Essential hypertension, benign   Type II diabetes mellitus, uncontrolled ([place])   HCAP (healthcare-associated pneumonia)   Tachycardia   Acute lower UTI  Discharge Instructions:  Allergies as of 12/16/2018   No Known Allergies     Medication List    TAKE these medications   acetaminophen 325 MG tablet Commonly known as: TYLENOL Take 650 mg by mouth every 4 (four) hours as needed for mild pain.   Acidophilus Lactobacillus Caps Take 1 capsule by mouth every morning.   Detrol LA 2 MG 24 hr capsule Generic drug: tolterodine Take 2 mg by mouth every morning.   doxycycline 100 MG capsule Commonly known as:  VIBRAMYCIN Take 1 capsule (100 mg total) by mouth 2 (two) times daily for 5 days.   Eliquis 5 MG Tabs tablet Generic drug: apixaban Take 5 mg by mouth 2 (two) times daily.   enalapril 2.5 MG tablet Commonly known as: VASOTEC Take 2.5 mg by mouth 2 (two) times daily.   famotidine 20 MG tablet Commonly known as:  PEPCID Take 20 mg by mouth every morning.   feeding supplement (PRO-STAT SUGAR FREE 64) Liqd Take 30 mLs by mouth 2 (two) times daily.   ketoconazole 2 % cream Commonly known as: NIZORAL Apply 1 application topically daily. To face for dryness   Melatonin 3 MG Tabs Take 1 tablet by mouth at bedtime.   metFORMIN 1000 MG tablet Commonly known as: GLUCOPHAGE Take 1,000 mg by mouth 2 (two) times daily.   methadone 5 MG tablet Commonly known as: DOLOPHINE Take 5 mg by mouth every 8 (eight) hours.   ondansetron 4 MG disintegrating tablet Commonly known as: ZOFRAN-ODT Take 4 mg by mouth every 4 (four) hours as needed for nausea or vomiting.   rOPINIRole 0.25 MG tablet Commonly known as: REQUIP Take 0.25 mg by mouth at bedtime.   sennosides-docusate sodium 8.6-50 MG tablet Commonly known as: SENOKOT-S Take 1 tablet by mouth daily.   sertraline 25 MG tablet Commonly known as: ZOLOFT Take 25 mg by mouth daily.   Thera-M Tabs Take 1 tablet by mouth daily.   trolamine salicylate 10 % cream Commonly known as: ASPERCREME Apply 1 application topically 2 (two) times daily.   vitamin C 500 MG tablet Commonly known as: ASCORBIC ACID Take 1 tablet by mouth 2 (two) times daily.   Vitamin D3 50 MCG (2000 UT) Tabs Take 2,000 Units by mouth daily.      Follow-up Information    Bernerd Limbo, MD Follow up in 5 day(s).   Specialty: Internal Medicine Contact information: [address] [place] [address] [phone]          No Known Allergies Allergies as of 12/16/2018   No Known Allergies     Medication List    TAKE these medications   acetaminophen 325 MG tablet Commonly known as: TYLENOL Take 650 mg by mouth every 4 (four) hours as needed for mild pain.   Acidophilus Lactobacillus Caps Take 1 capsule by mouth every morning.   Detrol LA 2 MG 24 hr capsule Generic drug: tolterodine Take 2 mg by mouth every morning.   doxycycline 100 MG  capsule Commonly known as: VIBRAMYCIN Take 1 capsule (100 mg total) by mouth 2 (two) times daily for 5 days.   Eliquis 5 MG Tabs tablet Generic drug: apixaban Take 5 mg by mouth 2 (two) times daily.   enalapril 2.5 MG tablet Commonly known as: VASOTEC Take 2.5 mg by mouth 2 (two) times daily.   famotidine 20 MG tablet Commonly known as: PEPCID Take 20 mg by mouth every morning.   feeding supplement (PRO-STAT SUGAR FREE 64) Liqd Take 30 mLs by mouth 2 (two) times daily.   ketoconazole 2 % cream Commonly known as: NIZORAL Apply 1 application topically daily. To face for dryness   Melatonin 3 MG Tabs Take 1 tablet by mouth at bedtime.   metFORMIN 1000 MG tablet Commonly known as: GLUCOPHAGE Take 1,000 mg by mouth 2 (two) times daily.   methadone 5 MG tablet Commonly known as: DOLOPHINE Take 5 mg by mouth every 8 (eight) hours.   ondansetron 4 MG disintegrating tablet Commonly  known as: ZOFRAN-ODT Take 4 mg by mouth every 4 (four) hours as needed for nausea or vomiting.   rOPINIRole 0.25 MG tablet Commonly known as: REQUIP Take 0.25 mg by mouth at bedtime.   sennosides-docusate sodium 8.6-50 MG tablet Commonly known as: SENOKOT-S Take 1 tablet by mouth daily.   sertraline 25 MG tablet Commonly known as: ZOLOFT Take 25 mg by mouth daily.   Thera-M Tabs Take 1 tablet by mouth daily.   trolamine salicylate 10 % cream Commonly known as: ASPERCREME Apply 1 application topically 2 (two) times daily.   vitamin C 500 MG tablet Commonly known as: ASCORBIC ACID Take 1 tablet by mouth 2 (two) times daily.   Vitamin D3 50 MCG (2000 UT) Tabs Take 2,000 Units by mouth daily.       Procedures/Studies: Dg Chest Port 1 View  Result Date: 12/12/2018 CLINICAL DATA:  Sepsis EXAM: PORTABLE CHEST 1 VIEW COMPARISON:  10/20/2017 FINDINGS: Low lung volumes. Patchy bilateral airspace opacities, best seen in the upper lobes and left lower lobe concerning for pneumonia. Heart is  borderline in size. No effusions or acute bony abnormality. IMPRESSION: Low lung volumes. Patchy bilateral airspace disease concerning for pneumonia. Electronically Signed   By: Charlett Nose M.D.   On: 12/12/2018 21:50   Dg Abd 2 Views  Result Date: 12/12/2018 CLINICAL DATA:  Code sepsis, fever EXAM: ABDOMEN - 2 VIEW COMPARISON:  02/25/2015, CT 01/16/2018 FINDINGS: No free air beneath the diaphragm. Moderate gastric distension. Overall nonobstructed bowel-gas pattern. Postsurgical changes in the left pelvis. Catheter over the inferior pelvis. Left lower quadrant ostomy. IMPRESSION: Nonobstructed bowel-gas pattern.  Moderate gastric distension Electronically Signed   By: Jasmine Pang M.D.   On: 12/12/2018 22:43      Subjective: Pt is nonverbal but is able to raise his thumb as a positive when asked if he is feeling well.  He is eating and drinking well.   Discharge Exam: Vitals:   12/15/18 2156 12/16/18 0504  BP: 115/75 103/70  Pulse: (!) 101 85  Resp: 17 16  Temp: 99 F (37.2 C) 98.6 F (37 C)  SpO2: 99% 100%   Vitals:   12/15/18 1300 12/15/18 2156 12/16/18 0500 12/16/18 0504  BP: 102/70 115/75  103/70  Pulse: 95 (!) 101  85  Resp: 17 17  16   Temp: 99.1 F (37.3 C) 99 F (37.2 C)  98.6 F (37 C)  TempSrc: Oral Oral  Oral  SpO2: 100% 99%  100%  Weight:   106.9 kg   Height:       General exam: chronically ill appearing male lying in bed awake, responsive with blinking yes/no, NAD.  Respiratory system:  No increased work of breathing. Cardiovascular system: S1 & S2 heard. No JVD, murmurs, gallops, clicks or pedal edema. Gastrointestinal system: Abdomen is nondistended, soft and nontender. Normal bowel sounds heard. Central nervous system: Alert and oriented. No focal neurological deficits. Extremities: no CCE.   The results of significant diagnostics from this hospitalization (including imaging, microbiology, ancillary and laboratory) are listed below for reference.      Microbiology: Recent Results (from the past 240 hour(s))  Culture, blood (Routine x 2)     Status: None (Preliminary result)   Collection Time: 12/12/18  9:12 PM   Specimen: Right Antecubital; Blood  Result Value Ref Range Status   Specimen Description RIGHT ANTECUBITAL  Final   Special Requests   Final    BOTTLES DRAWN AEROBIC AND ANAEROBIC Blood Culture adequate volume  Culture   Final    NO GROWTH 4 DAYS Performed at [facility], [address] [place], [place] [address]    Report Status PENDING  Incomplete  SARS Coronavirus 2 [facility](Hospital order, Performed in [facility] hospital lab) Nasopharyngeal Nasopharyngeal Swab     Status: None   Collection Time: 12/12/18  9:26 PM   Specimen: Nasopharyngeal Swab  Result Value Ref Range Status   SARS Coronavirus 2 NEGATIVE NEGATIVE Final    Comment: (NOTE) If result is NEGATIVE SARS-CoV-2 target nucleic acids are NOT DETECTED. The SARS-CoV-2 RNA is generally detectable in upper and lower  respiratory specimens during the acute phase of infection. The lowest  concentration of SARS-CoV-2 viral copies this assay can detect is 250  copies / mL. A negative result does not preclude SARS-CoV-2 infection  and should not be used as the sole basis for treatment or other  patient management decisions.  A negative result may occur with  improper specimen collection / handling, submission of specimen other  than nasopharyngeal swab, presence of viral mutation(s) within the  areas targeted by this assay, and inadequate number of viral copies  (<250 copies / mL). A negative result must be combined with clinical  observations, patient history, and epidemiological information. If result is POSITIVE SARS-CoV-2 target nucleic acids are DETECTED. The SARS-CoV-2 RNA is generally detectable in upper and lower  respiratory specimens dur ing the acute phase of infection.  Positive  results are indicative of active infection with SARS-CoV-2.  Clinical   correlation with patient history and other diagnostic information is  necessary to determine patient infection status.  Positive results do  not rule out bacterial infection or co-infection with other viruses. If result is PRESUMPTIVE POSTIVE SARS-CoV-2 nucleic acids MAY BE PRESENT.   A presumptive positive result was obtained on the submitted specimen  and confirmed on repeat testing.  While 2019 novel coronavirus  (SARS-CoV-2) nucleic acids may be present in the submitted sample  additional confirmatory testing may be necessary for epidemiological  and / or clinical management purposes  to differentiate between  SARS-CoV-2 and other Sarbecovirus currently known to infect humans.  If clinically indicated additional testing with an alternate test  methodology [phone] is advised. The SARS-CoV-2 RNA is generally  detectable in upper and lower respiratory sp ecimens during the acute  phase of infection. The expected result is Negative. Fact Sheet for Patients:  BoilerBrush.com.cyhttps://www.fda.gov/media/136312/download Fact Sheet for Healthcare Providers: https://pope.com/https://www.fda.gov/media/136313/download This test is not yet approved or cleared by the [place] FDA and has been authorized for detection and/or diagnosis of SARS-CoV-2 by FDA under an Emergency Use Authorization (EUA).  This EUA will remain in effect (meaning this test can be used) for the duration of the COVID-19 declaration under Section 564(b)(1) of the Act, 21 U.S.C. section 360bbb-3(b)(1), unless the authorization is terminated or revoked sooner. Performed at [facility], [address] [place], [place] [address]   Culture, blood (Routine x 2)     Status: None (Preliminary result)   Collection Time: 12/12/18  9:43 PM   Specimen: BLOOD LEFT ARM  Result Value Ref Range Status   Specimen Description BLOOD LEFT ARM  Final   Special Requests   Final    BOTTLES DRAWN AEROBIC AND ANAEROBIC Blood Culture adequate volume   Culture   Final     NO GROWTH 4 DAYS Performed at [facility], [address] [place], [place] [address]    Report Status PENDING  Incomplete  Urine culture  Status: Abnormal   Collection Time: 12/12/18  9:45 PM   Specimen: In/Out Cath Urine  Result Value Ref Range Status   Specimen Description   Final    IN/OUT CATH URINE Performed at [facility], [address] [place], [place] [address]    Special Requests   Final    NONE Performed at [facility], [address], [place], [place] [address]    Culture (A)  Final    >=100,000 COLONIES/mL MULTIPLE SPECIES PRESENT, SUGGEST RECOLLECTION   Report Status 12/14/2018 FINAL  Final     Labs: BNP (last 3 results) No results for input(s): BNP in the last 8760 hours. Basic Metabolic Panel: Recent Labs  Lab 12/12/18 2112 12/13/18 0501 12/15/18 0624  NA 135 134* 135  K 4.3 4.4 4.0  CL 99 101 104  CO2 24 25 24   GLUCOSE 124* 107* 91  BUN 23* 20 16  CREATININE 1.18 1.18 0.91  CALCIUM 8.7* 8.3* 8.5*   Liver Function Tests: Recent Labs  Lab 12/12/18 2112 12/13/18 0501 12/15/18 0624  AST 22 20 16   ALT 25 21 18   ALKPHOS 61 53 49  BILITOT 0.6 0.4 0.7  PROT 7.2 6.6 6.3*  ALBUMIN 3.5 3.1* 2.9*   No results for input(s): LIPASE, AMYLASE in the last 168 hours. No results for input(s): AMMONIA in the last 168 hours. CBC: Recent Labs  Lab 12/12/18 2112 12/13/18 0501 12/15/18 0624  WBC 3.6* 3.2* 3.0*  NEUTROABS 2.7  --  1.7  HGB 12.0* 11.1* 9.8*  HCT 37.2* 34.2* 30.6*  MCV 85.9 85.9 87.7  PLT 152 148* 143*   Cardiac Enzymes: No results for input(s): CKTOTAL, CKMB, CKMBINDEX, TROPONINI in the last 168 hours. BNP: Invalid input(s): POCBNP CBG: Recent Labs  Lab 12/15/18 0728 12/15/18 1110 12/15/18 1619 12/15/18 2157 12/16/18 0801  GLUCAP 91 105* 119* 107* 88   D-Dimer No results for input(s): DDIMER in the last 72 hours. Hgb A1c No results for input(s): HGBA1C in the last 72 hours. Lipid Profile No results for input(s):  CHOL, HDL, LDLCALC, TRIG, CHOLHDL, LDLDIRECT in the last 72 hours. Thyroid function studies No results for input(s): TSH, T4TOTAL, T3FREE, THYROIDAB in the last 72 hours.  Invalid input(s): FREET3 Anemia work up No results for input(s): VITAMINB12, FOLATE, FERRITIN, TIBC, IRON, RETICCTPCT in the last 72 hours. Urinalysis    Component Value Date/Time   COLORURINE YELLOW 12/12/2018 2148   APPEARANCEUR TURBID (A) 12/12/2018 2148   LABSPEC 1.024 12/12/2018 2148   PHURINE 9.0 (H) 12/12/2018 2148   GLUCOSEU NEGATIVE 12/12/2018 2148   HGBUR NEGATIVE 12/12/2018 2148   BILIRUBINUR NEGATIVE 12/12/2018 2148   KETONESUR 20 (A) 12/12/2018 2148   PROTEINUR >=300 (A) 12/12/2018 2148   UROBILINOGEN 0.2 02/15/2015 1908   NITRITE NEGATIVE 12/12/2018 2148   LEUKOCYTESUR MODERATE (A) 12/12/2018 2148   Sepsis Labs Invalid input(s): PROCALCITONIN,  WBC,  LACTICIDVEN Microbiology Recent Results (from the past 240 hour(s))  Culture, blood (Routine x 2)     Status: None (Preliminary result)   Collection Time: 12/12/18  9:12 PM   Specimen: Right Antecubital; Blood  Result Value Ref Range Status   Specimen Description RIGHT ANTECUBITAL  Final   Special Requests   Final    BOTTLES DRAWN AEROBIC AND ANAEROBIC Blood Culture adequate volume   Culture   Final    NO GROWTH 4 DAYS Performed at [facility], [address] [place], [place] [address]    Report Status PENDING  Incomplete  SARS Coronavirus  2 [facility](Hospital order, Performed in [facility] lab) Nasopharyngeal Nasopharyngeal Swab     Status: None   Collection Time: 12/12/18  9:26 PM   Specimen: Nasopharyngeal Swab  Result Value Ref Range Status   SARS Coronavirus 2 NEGATIVE NEGATIVE Final    Comment: (NOTE) If result is NEGATIVE SARS-CoV-2 target nucleic acids are NOT DETECTED. The SARS-CoV-2 RNA is generally detectable in upper and lower  respiratory specimens during the acute phase of infection. The lowest  concentration of SARS-CoV-2  viral copies this assay can detect is 250  copies / mL. A negative result does not preclude SARS-CoV-2 infection  and should not be used as the sole basis for treatment or other  patient management decisions.  A negative result may occur with  improper specimen collection / handling, submission of specimen other  than nasopharyngeal swab, presence of viral mutation(s) within the  areas targeted by this assay, and inadequate number of viral copies  (<250 copies / mL). A negative result must be combined with clinical  observations, patient history, and epidemiological information. If result is POSITIVE SARS-CoV-2 target nucleic acids are DETECTED. The SARS-CoV-2 RNA is generally detectable in upper and lower  respiratory specimens dur ing the acute phase of infection.  Positive  results are indicative of active infection with SARS-CoV-2.  Clinical  correlation with patient history and other diagnostic information is  necessary to determine patient infection status.  Positive results do  not rule out bacterial infection or co-infection with other viruses. If result is PRESUMPTIVE POSTIVE SARS-CoV-2 nucleic acids MAY BE PRESENT.   A presumptive positive result was obtained on the submitted specimen  and confirmed on repeat testing.  While 2019 novel coronavirus  (SARS-CoV-2) nucleic acids may be present in the submitted sample  additional confirmatory testing may be necessary for epidemiological  and / or clinical management purposes  to differentiate between  SARS-CoV-2 and other Sarbecovirus currently known to infect humans.  If clinically indicated additional testing with an alternate test  methodology [phone] is advised. The SARS-CoV-2 RNA is generally  detectable in upper and lower respiratory sp ecimens during the acute  phase of infection. The expected result is Negative. Fact Sheet for Patients:  BoilerBrush.com.cyhttps://www.fda.gov/media/136312/download Fact Sheet for Healthcare  Providers: https://pope.com/https://www.fda.gov/media/136313/download This test is not yet approved or cleared by the [place] FDA and has been authorized for detection and/or diagnosis of SARS-CoV-2 by FDA under an Emergency Use Authorization (EUA).  This EUA will remain in effect (meaning this test can be used) for the duration of the COVID-19 declaration under Section 564(b)(1) of the Act, 21 U.S.C. section 360bbb-3(b)(1), unless the authorization is terminated or revoked sooner. Performed at [facility], [address] [place], [place] [address]   Culture, blood (Routine x 2)     Status: None (Preliminary result)   Collection Time: 12/12/18  9:43 PM   Specimen: BLOOD LEFT ARM  Result Value Ref Range Status   Specimen Description BLOOD LEFT ARM  Final   Special Requests   Final    BOTTLES DRAWN AEROBIC AND ANAEROBIC Blood Culture adequate volume   Culture   Final    NO GROWTH 4 DAYS Performed at [facility], [address] [place], [place] [address]    Report Status PENDING  Incomplete  Urine culture     Status: Abnormal   Collection Time: 12/12/18  9:45 PM   Specimen: In/Out Cath Urine  Result Value Ref Range Status   Specimen Description   Final  IN/OUT CATH URINE Performed at [facility], [address], [place], [place] [address]    Special Requests   Final    NONE Performed at [facility], [address], [place], [place] [address]    Culture (A)  Final    >=100,000 COLONIES/mL MULTIPLE SPECIES PRESENT, SUGGEST RECOLLECTION   Report Status 12/14/2018 FINAL  Final   Time coordinating discharge: 34 minutes  SIGNED:  Standley Dakins, MD  [facility] 12/16/2018, 9:53 AM How to contact the [facility] Attending or Consulting provider 7A - 7P or covering provider during after hours 7P -7A, for this patient?  1. Check the care team in [facility] and look for a) attending/consulting [facility] provider listed and b) the [facility] team listed 2. Log into [email or web address] and use [place]'s universal  password to access. If you do not have the password, please contact the hospital operator. 3. Locate the [facility] provider you are looking for under [facility] and page to a number that you can be directly reached. 4. If you still have difficulty reaching the provider, please page the [facility] (Director on Call) for the Hospitalists listed on amion for assistance.

## 2018-12-16 NOTE — [place] FL2 (Signed)
[place] MEDICAID FL2 LEVEL OF CARE SCREENING TOOL     IDENTIFICATION  Patient Name: Albert Jensen Birthdate: [birth year] Sex: male Admission Date (Current Location): 12/12/2018  [facility] and [place] Number:  [employer] and Address:  [facility] [address], [place]      Provider Number: [id number]  Attending Physician Name and Address:  Murlean Iba, MD  Relative Name and Phone Number:       Current Level of Care: Hospital Recommended Level of Care: Nursing Facility Prior Approval Number:    Date Approved/Denied:   PASRR Number: [id number]  Discharge Plan: Domiciliary (Rest home)([place])    Current Diagnoses: Patient Active Problem List   Diagnosis Date Noted  . Sepsis ([place]) 12/12/2018  . Tachycardia 12/12/2018  . Acute lower UTI 12/12/2018  . Acute respiratory failure with hypoxemia ([place])   . Tracheostomy status ([place])   . HCAP (healthcare-associated pneumonia)   . Atelectasis   . Encephalopathy   . Acute renal failure ([place])   . Urinary tract infection, site not specified   . Type II diabetes mellitus, uncontrolled ([place])   . Disorientation   . Pressure ulcer 01/31/2015  . Acute respiratory failure ([place])   . Anoxic encephalopathy ([place]) 01/25/2015  . Acute respiratory failure with hypoxia ([place]) 01/25/2015  . Acute ST elevation myocardial infarction (STEMI) ([place])   . Respiratory failure ([place])   . Cardiac arrest ([place]) 01/22/2015  . Acute MI, inferolateral wall, initial episode of care ([place])   . Essential hypertension, benign 05/25/2010  . VENTRICULAR TACHYCARDIA, PAROXYSMAL 05/25/2010    Orientation RESPIRATION BLADDER Height & Weight     (non verbal)  Normal (Suprapubic) Weight: 106.9 kg Height:  5\' 11"  (180.3 cm)  BEHAVIORAL SYMPTOMS/MOOD NEUROLOGICAL BOWEL NUTRITION STATUS      Incontinent Diet(Heart Healthy/ carb modified)  AMBULATORY STATUS COMMUNICATION OF NEEDS Skin   Total Care Non-Verbally  Bruising, PU Stage and Appropriate Care                       Personal Care Assistance Level of Assistance  Total care       Total Care Assistance: Maximum assistance   Functional Limitations Info  Sight, Hearing, Speech Sight Info: Adequate Hearing Info: Adequate Speech Info: Impaired    SPECIAL CARE FACTORS FREQUENCY                       Contractures      Additional Factors Info  Code Status, Allergies Code Status Info: Full Allergies Info: NKDA           Current Medications (12/16/2018):  This is the current hospital active medication list Current Facility-Administered Medications  Medication Dose Route Frequency Provider Last Rate Last Dose  . 0.9 %  sodium chloride infusion   Intravenous Continuous Wynetta Emery, Clanford L, MD 50 mL/hr at 12/15/18 0947    . acetaminophen (TYLENOL) tablet 650 mg  650 mg Oral Q6H PRN Jani Gravel, MD   650 mg at 12/13/18 1413   Or  . acetaminophen (TYLENOL) suppository 650 mg  650 mg Rectal Q6H PRN Jani Gravel, MD      . acidophilus (RISAQUAD) capsule 1 capsule  1 capsule Oral q morning - 10a Jani Gravel, MD   1 capsule at 12/16/18 0906  . apixaban (ELIQUIS) tablet 5 mg  5 mg Oral BID Jani Gravel, MD   5 mg at 12/16/18 0906  . ceFEPIme (MAXIPIME)  2 g in sodium chloride 0.9 % 100 mL IVPB  2 g Intravenous Q8H Coffee, Garrett, RPH 200 mL/hr at 12/16/18 0738 2 g at 12/16/18 0738  . cholecalciferol (VITAMIN D3) tablet 2,000 Units  2,000 Units Oral Daily Pearson GrippeKim, James, MD   2,000 Units at 12/16/18 [phone]  . enalapril (VASOTEC) tablet 2.5 mg  2.5 mg Oral BID Pearson GrippeKim, James, MD   2.5 mg at 12/16/18 0905  . famotidine (PEPCID) tablet 20 mg  20 mg Oral q morning - 10a Pearson GrippeKim, James, MD   20 mg at 12/16/18 0907  . feeding supplement (PRO-STAT SUGAR FREE 64) liquid 30 mL  30 mL Oral BID Pearson GrippeKim, James, MD   30 mL at 12/16/18 0907  . fesoterodine (TOVIAZ) tablet 4 mg  4 mg Oral Daily Pearson GrippeKim, James, MD   4 mg at 12/16/18 0905  . insulin aspart (novoLOG) injection  0-5 Units  0-5 Units Subcutaneous QHS Pearson GrippeKim, James, MD      . insulin aspart (novoLOG) injection 0-9 Units  0-9 Units Subcutaneous TID WC Pearson GrippeKim, James, MD   1 Units at 12/14/18 1227  . ketoconazole (NIZORAL) 2 % cream 1 application  1 application Topical Daily Pearson GrippeKim, James, MD   1 application at 12/16/18 [phone]  . Melatonin TABS 3 mg  1 tablet Oral QHS Pearson GrippeKim, James, MD   3 mg at 12/15/18 2318  . methadone (DOLOPHINE) tablet 5 mg  5 mg Oral Q8H Pearson GrippeKim, James, MD   5 mg at 12/16/18 0739  . multivitamin with minerals tablet 1 tablet  1 tablet Oral Daily Pearson GrippeKim, James, MD   1 tablet at 12/16/18 [phone]  . ondansetron (ZOFRAN-ODT) disintegrating tablet 4 mg  4 mg Oral Q4H PRN Pearson GrippeKim, James, MD      . rOPINIRole (REQUIP) tablet 0.25 mg  0.25 mg Oral QHS Pearson GrippeKim, James, MD   0.25 mg at 12/15/18 2319  . senna-docusate (Senokot-S) tablet 1 tablet  1 tablet Oral Daily Pearson GrippeKim, James, MD   1 tablet at 12/16/18 [phone]  . sertraline (ZOLOFT) tablet 25 mg  25 mg Oral Daily Pearson GrippeKim, James, MD   25 mg at 12/16/18 0906  . vitamin C (ASCORBIC ACID) tablet 500 mg  500 mg Oral BID Pearson GrippeKim, James, MD   500 mg at 12/16/18 [phone]     Discharge Medications: Please see discharge summary for a list of discharge medications.  Relevant Imaging Results:  Relevant Lab Results:   Additional Information SS# [id number]  Drury Ardizzone, Chrystine OilerSharley Diane, RN

## 2018-12-16 NOTE — Progress Notes (Signed)
IV removed and report called to [facility], [place].  EMS has tranported patient

## 2018-12-17 LAB — CULTURE, BLOOD (ROUTINE X 2)
Culture: NO GROWTH
Culture: NO GROWTH
Special Requests: ADEQUATE
Special Requests: ADEQUATE

## 2019-01-08 DIAGNOSIS — L89153 Pressure ulcer of sacral region, stage 3: Secondary | ICD-10-CM | POA: Diagnosis not present

## 2019-01-08 DIAGNOSIS — N390 Urinary tract infection, site not specified: Secondary | ICD-10-CM | POA: Diagnosis not present

## 2019-01-08 DIAGNOSIS — Z5320 Procedure and treatment not carried out because of patient's decision for unspecified reasons: Secondary | ICD-10-CM | POA: Diagnosis not present

## 2019-01-08 DIAGNOSIS — E1140 Type 2 diabetes mellitus with diabetic neuropathy, unspecified: Secondary | ICD-10-CM | POA: Diagnosis not present

## 2019-01-08 DIAGNOSIS — I6389 Other cerebral infarction: Secondary | ICD-10-CM | POA: Diagnosis not present

## 2019-01-20 DIAGNOSIS — F339 Major depressive disorder, recurrent, unspecified: Secondary | ICD-10-CM | POA: Diagnosis not present

## 2019-01-20 DIAGNOSIS — I2510 Atherosclerotic heart disease of native coronary artery without angina pectoris: Secondary | ICD-10-CM | POA: Diagnosis not present

## 2019-01-26 DIAGNOSIS — Z20828 Contact with and (suspected) exposure to other viral communicable diseases: Secondary | ICD-10-CM | POA: Diagnosis not present

## 2019-01-26 DIAGNOSIS — Z1383 Encounter for screening for respiratory disorder NEC: Secondary | ICD-10-CM | POA: Diagnosis not present

## 2019-02-10 DIAGNOSIS — N319 Neuromuscular dysfunction of bladder, unspecified: Secondary | ICD-10-CM | POA: Diagnosis not present

## 2019-02-10 DIAGNOSIS — I480 Paroxysmal atrial fibrillation: Secondary | ICD-10-CM | POA: Diagnosis not present

## 2019-02-10 DIAGNOSIS — L02215 Cutaneous abscess of perineum: Secondary | ICD-10-CM | POA: Diagnosis not present

## 2019-02-10 DIAGNOSIS — Z933 Colostomy status: Secondary | ICD-10-CM | POA: Diagnosis not present

## 2019-02-18 DIAGNOSIS — F331 Major depressive disorder, recurrent, moderate: Secondary | ICD-10-CM | POA: Diagnosis not present

## 2019-02-23 DIAGNOSIS — Z03818 Encounter for observation for suspected exposure to other biological agents ruled out: Secondary | ICD-10-CM | POA: Diagnosis not present

## 2019-02-23 DIAGNOSIS — Z20828 Contact with and (suspected) exposure to other viral communicable diseases: Secondary | ICD-10-CM | POA: Diagnosis not present

## 2019-03-02 DIAGNOSIS — Z03818 Encounter for observation for suspected exposure to other biological agents ruled out: Secondary | ICD-10-CM | POA: Diagnosis not present

## 2019-03-02 DIAGNOSIS — Z20828 Contact with and (suspected) exposure to other viral communicable diseases: Secondary | ICD-10-CM | POA: Diagnosis not present

## 2019-03-09 DIAGNOSIS — Z03818 Encounter for observation for suspected exposure to other biological agents ruled out: Secondary | ICD-10-CM | POA: Diagnosis not present

## 2019-03-09 DIAGNOSIS — Z20828 Contact with and (suspected) exposure to other viral communicable diseases: Secondary | ICD-10-CM | POA: Diagnosis not present

## 2019-03-16 DIAGNOSIS — Z03818 Encounter for observation for suspected exposure to other biological agents ruled out: Secondary | ICD-10-CM | POA: Diagnosis not present

## 2019-03-16 DIAGNOSIS — Z20828 Contact with and (suspected) exposure to other viral communicable diseases: Secondary | ICD-10-CM | POA: Diagnosis not present

## 2019-03-18 DIAGNOSIS — L89153 Pressure ulcer of sacral region, stage 3: Secondary | ICD-10-CM | POA: Diagnosis not present

## 2019-03-18 DIAGNOSIS — Z5320 Procedure and treatment not carried out because of patient's decision for unspecified reasons: Secondary | ICD-10-CM | POA: Diagnosis not present

## 2019-03-18 DIAGNOSIS — Z9359 Other cystostomy status: Secondary | ICD-10-CM | POA: Diagnosis not present

## 2019-03-18 DIAGNOSIS — E1140 Type 2 diabetes mellitus with diabetic neuropathy, unspecified: Secondary | ICD-10-CM | POA: Diagnosis not present

## 2019-03-18 DIAGNOSIS — G931 Anoxic brain damage, not elsewhere classified: Secondary | ICD-10-CM | POA: Diagnosis not present

## 2021-09-26 ENCOUNTER — Other Ambulatory Visit: Payer: Self-pay | Admitting: Urology

## 2021-10-20 ENCOUNTER — Encounter (HOSPITAL_COMMUNITY): Payer: Self-pay | Admitting: Urology

## 2021-10-20 NOTE — Patient Instructions (Addendum)
Preop instructions for: Albert Jensen     Date of Birth: 23-May-1970                      Date of Procedure: Monday, 10/30/2021   Procedure:    CYSTOSCOPY BOTOX INJECTION  Surgeon: Dr. Modena Slater Facility contact: Lindaann Pascal    Phone:  [phone]               Health Care POA:  RN contact name/phone#: Susy [place], Nurse                         and Fax #: [phone]   Transportation contact phone#: EMS  Time to arrive at [facility]: 7:30AM   Report to: Admitting (On your left hand side)    Do not eat past midnight the night before your procedure.(To include any tube feedings-must be discontinued)  May have liquids until 7:30 AM day of surgery  Water Black Coffee (sugar ok, NO MILK/CREAM OR CREAMERS)  Tea (sugar ok, NO MILK/CREAM OR CREAMERS) regular and decaf                             Plain Jell-O (NO RED)                                           Fruit ices (not with fruit pulp, NO RED)                                     Popsicles (NO RED)                                                                  Juice: apple, WHITE grape, WHITE cranberry Sports drinks like Gatorade (NO RED)   Take these morning medications only with sips of water.(or give through gastrostomy or feeding tube).    Amlodipine Myrbetriq Sennosides docusate sodium Vesicare   Note: No Insulin or Diabetic meds should be given or taken the morning of the procedure!   Please send day of procedure:current med list and meds last taken that day, confirm nothing by mouth status from what time, Patient Demographic info( to include DNR status, problem list, allergies)   Bring Insurance card and picture ID Leave all jewelry and other valuables at place where living( no metal or rings to be worn) No contact lens  Men-no colognes,lotions   Any questions day of procedure,call  SHORT [phone]     Sent from :[facility]                   Phone:[phone]                    [phone]   Sent by :  Rockwell Alexandria BSN, RN

## 2021-10-20 NOTE — Progress Notes (Addendum)
For Short Stay: COVID SWAB appointment date: N/A Date of COVID positive in last 90 days: N/A  Bowel Prep reminder: N/A   For Anesthesia: PCP - Dr. Beryle Flock Cardiologist - Corky Crafts, MD, Nona Dell, MD   Chest x-ray - greater than 1 year  EKG -greater than 1 year  Stress Test -  ECHO - greater than 2 years Cardiac Cath - greater than 2 years Pacemaker/ICD device last checked: N/A Pacemaker orders received: N/A Device Rep notified: N/A  Spinal Cord Stimulator:N/A  Sleep Study - N/A CPAP - N/A  Fasting Blood Sugar - N/A Checks Blood Sugar __N/A___ times a day Date and result of last Hgb A1c- N/A  Blood Thinner Instructions: Not noted on [facility] sent via Lindaann Pascal Aspirin Instructions: N/A Last Dose: N/A  Activity level: patient is nonambulatory stays in bed except for 2 showers per week     Anesthesia review: History of MI with cardiac arrest, PAFIB, CAD, History of stroke, bedridden, HTN, DM  Patient denies shortness of breath, fever, cough and chest pain at PAT appointment   Patient verbalized understanding of instructions that were given to them at the PAT appointment. Patient was also instructed that they will need to review over the PAT instructions again at home before surgery.

## 2021-10-26 ENCOUNTER — Encounter (HOSPITAL_COMMUNITY): Payer: Self-pay | Admitting: Urology

## 2021-10-27 ENCOUNTER — Encounter (HOSPITAL_COMMUNITY): Payer: Self-pay | Admitting: Urology

## 2021-10-30 ENCOUNTER — Encounter (HOSPITAL_COMMUNITY): Payer: Self-pay | Admitting: Urology

## 2021-10-30 ENCOUNTER — Ambulatory Visit (HOSPITAL_BASED_OUTPATIENT_CLINIC_OR_DEPARTMENT_OTHER): Payer: Medicare Other | Admitting: Physician Assistant

## 2021-10-30 ENCOUNTER — Ambulatory Visit (HOSPITAL_COMMUNITY)
Admission: RE | Admit: 2021-10-30 | Discharge: 2021-10-30 | Disposition: A | Discharge status: Home / Self Care | Payer: Medicare Other | Attending: Urology | Admitting: Urology

## 2021-10-30 ENCOUNTER — Ambulatory Visit (HOSPITAL_COMMUNITY): Payer: Medicare Other | Admitting: Physician Assistant

## 2021-10-30 ENCOUNTER — Encounter (HOSPITAL_COMMUNITY)
Admission: RE | Disposition: A | Discharge status: Home / Self Care | Payer: Self-pay | Source: Home / Self Care | Attending: Urology

## 2021-10-30 DIAGNOSIS — E119 Type 2 diabetes mellitus without complications: Secondary | ICD-10-CM

## 2021-10-30 DIAGNOSIS — I2510 Atherosclerotic heart disease of native coronary artery without angina pectoris: Secondary | ICD-10-CM | POA: Diagnosis not present

## 2021-10-30 DIAGNOSIS — N319 Neuromuscular dysfunction of bladder, unspecified: Secondary | ICD-10-CM | POA: Insufficient documentation

## 2021-10-30 DIAGNOSIS — Z933 Colostomy status: Secondary | ICD-10-CM | POA: Insufficient documentation

## 2021-10-30 DIAGNOSIS — E1122 Type 2 diabetes mellitus with diabetic chronic kidney disease: Secondary | ICD-10-CM | POA: Diagnosis not present

## 2021-10-30 DIAGNOSIS — I252 Old myocardial infarction: Secondary | ICD-10-CM | POA: Diagnosis not present

## 2021-10-30 DIAGNOSIS — Z7984 Long term (current) use of oral hypoglycemic drugs: Secondary | ICD-10-CM | POA: Insufficient documentation

## 2021-10-30 DIAGNOSIS — D649 Anemia, unspecified: Secondary | ICD-10-CM

## 2021-10-30 DIAGNOSIS — I10 Essential (primary) hypertension: Secondary | ICD-10-CM

## 2021-10-30 DIAGNOSIS — Z8673 Personal history of transient ischemic attack (TIA), and cerebral infarction without residual deficits: Secondary | ICD-10-CM | POA: Insufficient documentation

## 2021-10-30 DIAGNOSIS — Z87891 Personal history of nicotine dependence: Secondary | ICD-10-CM

## 2021-10-30 DIAGNOSIS — I2119 ST elevation (STEMI) myocardial infarction involving other coronary artery of inferior wall: Secondary | ICD-10-CM

## 2021-10-30 HISTORY — DX: Personal history of other medical treatment: Z92.89

## 2021-10-30 HISTORY — DX: Anemia, unspecified: D64.9

## 2021-10-30 HISTORY — DX: Gastro-esophageal reflux disease without esophagitis: K21.9

## 2021-10-30 HISTORY — DX: Other specified postprocedural states: Z98.890

## 2021-10-30 HISTORY — DX: Cognitive communication deficit: R41.841

## 2021-10-30 HISTORY — DX: Neuromuscular dysfunction of bladder, unspecified: N31.9

## 2021-10-30 HISTORY — DX: Other cystostomy status: Z93.59

## 2021-10-30 HISTORY — PX: BOTOX INJECTION: SHX5754

## 2021-10-30 HISTORY — DX: Chronic kidney disease, unspecified: N18.9

## 2021-10-30 HISTORY — DX: Aphasia following cerebral infarction: I69.320

## 2021-10-30 HISTORY — DX: Depression, unspecified: F32.A

## 2021-10-30 HISTORY — DX: Hemiplegia, unspecified affecting unspecified side: G81.90

## 2021-10-30 HISTORY — DX: Benign prostatic hyperplasia with lower urinary tract symptoms: N13.8

## 2021-10-30 HISTORY — DX: Colostomy status: Z93.3

## 2021-10-30 HISTORY — DX: Unspecified cataract: H26.9

## 2021-10-30 HISTORY — DX: Osteomyelitis, unspecified: M86.9

## 2021-10-30 HISTORY — DX: Peripheral vascular disease, unspecified: I73.9

## 2021-10-30 HISTORY — DX: Restless legs syndrome: G25.81

## 2021-10-30 HISTORY — DX: Seborrheic dermatitis, unspecified: L21.9

## 2021-10-30 HISTORY — DX: Personal history of other infectious and parasitic diseases: Z86.19

## 2021-10-30 HISTORY — DX: Benign prostatic hyperplasia with lower urinary tract symptoms: N40.1

## 2021-10-30 HISTORY — DX: Type 2 diabetes mellitus with diabetic neuropathy, unspecified: E11.40

## 2021-10-30 HISTORY — DX: Unspecified dementia, unspecified severity, without behavioral disturbance, psychotic disturbance, mood disturbance, and anxiety: F03.90

## 2021-10-30 LAB — CBC
HCT: 45.4 % (ref 39.0–52.0)
Hemoglobin: 14.7 g/dL (ref 13.0–17.0)
MCH: 27.6 pg (ref 26.0–34.0)
MCHC: 32.4 g/dL (ref 30.0–36.0)
MCV: 85.2 fL (ref 80.0–100.0)
Platelets: 220 10*3/uL (ref 150–400)
RBC: 5.33 MIL/uL (ref 4.22–5.81)
RDW: 15.2 % (ref 11.5–15.5)
WBC: 6.1 10*3/uL (ref 4.0–10.5)
nRBC: 0 % (ref 0.0–0.2)

## 2021-10-30 LAB — BASIC METABOLIC PANEL
Anion gap: 6 (ref 5–15)
BUN: 17 mg/dL (ref 6–20)
CO2: 26 mmol/L (ref 22–32)
Calcium: 8.5 mg/dL — ABNORMAL LOW (ref 8.9–10.3)
Chloride: 105 mmol/L (ref 98–111)
Creatinine, Ser: 1.11 mg/dL (ref 0.61–1.24)
GFR, Estimated: >60 mL/min (ref >60)
Glucose, Bld: 118 mg/dL — ABNORMAL HIGH (ref 70–99)
Potassium: 3.7 mmol/L (ref 3.5–5.1)
Sodium: 137 mmol/L (ref 135–145)

## 2021-10-30 LAB — HEMOGLOBIN A1C
Hgb A1c MFr Bld: 6.5 % — ABNORMAL HIGH (ref 4.8–5.6)
Mean Plasma Glucose: 139.85 mg/dL

## 2021-10-30 LAB — GLUCOSE, CAPILLARY: Glucose-Capillary: 119 mg/dL — ABNORMAL HIGH (ref 70–99)

## 2021-10-30 SURGERY — BOTOX INJECTION
Anesthesia: General

## 2021-10-30 MED ORDER — PROPOFOL 10 MG/ML IV BOLUS
INTRAVENOUS | Status: DC | PRN
  Administered 2021-10-30: 10 mg via INTRAVENOUS
  Administered 2021-10-30: 50 mg via INTRAVENOUS

## 2021-10-30 MED ORDER — SODIUM CHLORIDE 0.9 % IV SOLN
1.0000 g | INTRAVENOUS | Status: AC
Start: 2021-10-30 — End: 2021-10-30
  Administered 2021-10-30: 1 g via INTRAVENOUS
  Filled 2021-10-30: qty 1

## 2021-10-30 MED ORDER — FENTANYL CITRATE (PF) 100 MCG/2ML IJ SOLN
INTRAMUSCULAR | Status: DC | PRN
Start: 2021-10-30 — End: 2021-10-30
  Administered 2021-10-30 (×2): 25 ug via INTRAVENOUS

## 2021-10-30 MED ORDER — DEXAMETHASONE SODIUM PHOSPHATE 10 MG/ML IJ SOLN
INTRAMUSCULAR | Status: AC
  Filled 2021-10-30: qty 1

## 2021-10-30 MED ORDER — STERILE WATER FOR IRRIGATION IR SOLN
Status: DC | PRN
  Administered 2021-10-30: 3000 mL

## 2021-10-30 MED ORDER — LIDOCAINE 2% (20 MG/ML) 5 ML SYRINGE
INTRAMUSCULAR | Status: DC | PRN
  Administered 2021-10-30: 60 mg via INTRAVENOUS

## 2021-10-30 MED ORDER — ORAL CARE MOUTH RINSE: 15.0000 mL | Freq: Once | OROMUCOSAL | Status: DC

## 2021-10-30 MED ORDER — ONABOTULINUMTOXINA 100 UNITS IJ SOLR
INTRAMUSCULAR | Status: AC
  Filled 2021-10-30: qty 200

## 2021-10-30 MED ORDER — FENTANYL CITRATE (PF) 100 MCG/2ML IJ SOLN
INTRAMUSCULAR | Status: AC
  Filled 2021-10-30: qty 2

## 2021-10-30 MED ORDER — PROPOFOL 500 MG/50ML IV EMUL
INTRAVENOUS | Status: AC
  Filled 2021-10-30: qty 50

## 2021-10-30 MED ORDER — FENTANYL CITRATE PF 50 MCG/ML IJ SOSY: 25.0000 ug | PREFILLED_SYRINGE | INTRAMUSCULAR | Status: DC | PRN

## 2021-10-30 MED ORDER — LIDOCAINE HCL (PF) 2 % IJ SOLN
INTRAMUSCULAR | Status: AC
  Filled 2021-10-30: qty 5

## 2021-10-30 MED ORDER — SODIUM CHLORIDE (PF) 0.9 % IJ SOLN
INTRAMUSCULAR | Status: AC
  Filled 2021-10-30: qty 10

## 2021-10-30 MED ORDER — PROPOFOL 10 MG/ML IV BOLUS
INTRAVENOUS | Status: AC
  Filled 2021-10-30: qty 20

## 2021-10-30 MED ORDER — ONABOTULINUMTOXINA 100 UNITS IJ SOLR
INTRAMUSCULAR | Status: DC | PRN
  Administered 2021-10-30: 200 [IU] via INTRAMUSCULAR

## 2021-10-30 MED ORDER — SODIUM CHLORIDE (PF) 0.9 % IJ SOLN
INTRAMUSCULAR | Status: DC | PRN
  Administered 2021-10-30: 20 mL via INTRAVENOUS

## 2021-10-30 MED ORDER — LACTATED RINGERS IV SOLN: INTRAVENOUS | Status: DC

## 2021-10-30 MED ORDER — DEXAMETHASONE SODIUM PHOSPHATE 10 MG/ML IJ SOLN
INTRAMUSCULAR | Status: DC | PRN
  Administered 2021-10-30: 10 mg via INTRAVENOUS

## 2021-10-30 MED ORDER — CHLORHEXIDINE GLUCONATE 0.12 % MT SOLN: 15.0000 mL | Freq: Once | OROMUCOSAL | Status: DC

## 2021-10-30 MED ORDER — AMISULPRIDE (ANTIEMETIC) 5 MG/2ML IV SOLN: 10.0000 mg | Freq: Once | INTRAVENOUS | Status: DC | PRN

## 2021-10-30 MED ORDER — ONDANSETRON HCL 4 MG/2ML IJ SOLN
INTRAMUSCULAR | Status: AC
  Filled 2021-10-30: qty 2

## 2021-10-30 SURGICAL SUPPLY — 19 items
BAG URINE DRAIN 2000ML AR STRL (UROLOGICAL SUPPLIES) ×1 IMPLANT
BAG URO CATCHER STRL LF (MISCELLANEOUS) ×2 IMPLANT
CATH FOLEY 2WAY SLVR  5CC 16FR (CATHETERS) ×2
CATH FOLEY 2WAY SLVR 5CC 16FR (CATHETERS) IMPLANT
CLOTH BEACON ORANGE TIMEOUT ST (SAFETY) ×1 IMPLANT
GLOVE BIO SURGEON STRL SZ7.5 (GLOVE) ×2 IMPLANT
GOWN STRL REUS W/ TWL XL LVL3 (GOWN DISPOSABLE) ×2 IMPLANT
GOWN STRL REUS W/TWL XL LVL3 (GOWN DISPOSABLE) ×4
MANIFOLD NEPTUNE II (INSTRUMENTS) ×2 IMPLANT
NDL ASPIRATION 22 (NEEDLE) ×1 IMPLANT
NDL SAFETY ECLIP 18X1.5 (MISCELLANEOUS) ×1 IMPLANT
NDL SAFETY ECLIPSE 18X1.5 (NEEDLE) IMPLANT
NEEDLE ASPIRATION 22 (NEEDLE) ×2 IMPLANT
NEEDLE HYPO 18GX1.5 SHARP (NEEDLE)
PACK CYSTO (CUSTOM PROCEDURE TRAY) ×2 IMPLANT
SYR 10ML LL (SYRINGE) ×1 IMPLANT
SYR CONTROL 10ML LL (SYRINGE) ×1 IMPLANT
TUBING CONNECTING 10 (TUBING) ×1 IMPLANT
WATER STERILE IRR 3000ML UROMA (IV SOLUTION) ×3 IMPLANT

## 2021-10-30 NOTE — H&P (Signed)
CC/HPI: CC: Consideration of suprapubic tube  HPI: Patient is a 51 year old male that presents on a stretcher. Nobody in the room knows his medical history and he is nonverbal. With the limited records that he has, it reveals he has type 2 diabetes, history of anoxic brain damage, history of myocardial infarction, chronic kidney disease, cerebral infarction, urinary retention. The patient is able to give me a thumbs up and a thumbs down in response to questions. Patient is on Plavix. On exam he has a colostomy bag.   08/13/2017  Patient presents today for reevaluation of suprapubic tube. He does now wished to proceed. He is still nonverbal but able to give a thumbs up and it comes down. He enthusiastically gives a thumbs up to wanting a suprapubic tube and to the fact that the Foley catheter is bothersome.   11/05/2017:  Patient underwent a interventional radiology imaging guided suprapubic tube placement on 09/22/2017. He presents for upsizing. He appears to have no complaints however he is nonverbal.   06/22/2021  Patient continues bladder management with a suprapubic tube. He has been having leakage per urethra. He is able to respond to questions with thumbs up or thumbs down but is nonverbal.   09/22/2021: 51 year old man with neurogenic bladder who presents today for follow-up. He is currently managed with a suprapubic tube. He was started on Myrbetriq and has been taking this daily. According to the facility, he continues to have urethral leakage. Options were discussed with the patient and per last office visit note bladder Botox would be beneficial. This will need to be completed in a hospital setting.   10/30/2021 Patient presents today for bladder Botox.   ALLERGIES: None   MEDICATIONS: Lipitor 80 mg tablet  Metformin Hcl 500 mg tablet  Myrbetriq 50 mg tablet, extended release 24 hr 1 tablet PO Daily  Plavix 75 mg tablet  Terazosin Hcl 1 mg capsule  Vesicare 5 mg tablet 1 tablet PO Daily   Amlodipine Besilate  Aspir 81  Calmoseptine  Cetaphil  Claritin  Docusate Sodium  Melatonin  Multivitamin  Nizoral 2 % shampoo  Norco 5 mg-325 mg tablet tablet  Pepcid  Remeron  Requip  Senna  Sertraline Hcl 50 mg tablet  Simethicone 80 mg tablet,chewable  Tylenol 325 mg capsule  Vasotec 2.5 mg tablet  Vitamin C  Vitamin D3  Zofran  Zoloft 50 mg tablet     GU PSH: Compl Change SP Tube - 2019 Simple Change SP Tube - 2019     NON-GU PSH: No Non-GU PSH    GU PMH: Incontinence w/o Sensation - 06/22/2021 Urinary Retention - 06/22/2021, - 2019    NON-GU PMH: Atrial Fibrillation Depression Diabetes Type 2 Hypertension Myocardial Infarction Stroke/TIA    FAMILY HISTORY: No Family History    SOCIAL HISTORY: Marital Status: Single    REVIEW OF SYSTEMS:    GU Review Male:   Patient reports leakage of urine. Patient denies frequent urination, hard to postpone urination, burning/ pain with urination, get up at night to urinate, stream starts and stops, trouble starting your stream, have to strain to urinate , erection problems, and penile pain.  Gastrointestinal (Upper):   Patient denies nausea, vomiting, and indigestion/ heartburn.  Gastrointestinal (Lower):   Patient denies diarrhea and constipation.  Constitutional:   Patient denies fever, night sweats, weight loss, and fatigue.  Skin:   Patient denies skin rash/ lesion and itching.  Eyes:   Patient denies blurred vision and double vision.  Musculoskeletal:  Patient denies back pain and joint pain.  Neurological:   Patient denies headaches and dizziness.  Psychologic:   Patient denies depression and anxiety.   BP 103/75   Pulse 96   Temp 98.6 F (37 C) (Oral)   Resp 18   Ht 6\' 3"  (1.905 m)   Wt 106.9 kg Comment: per paperwork  SpO2 97%   BMI 29.45 kg/m    GU PHYSICAL EXAMINATION:      Notes: Suprapubic tube in place draining cloudy colored yellow urine. Free of clots.    MULTI-SYSTEM PHYSICAL  EXAMINATION:    Constitutional: Well-nourished. No physical deformities. Normally developed. Good grooming.  Respiratory: No labored breathing, no use of accessory muscles.   Cardiovascular: Normal temperature, normal extremity pulses, no swelling, no varicosities.  Skin: No paleness, no jaundice, no cyanosis. No lesion, no ulcer, no rash.  Neurologic / Psychiatric: Oriented to time, oriented to place, oriented to person. No depression, no anxiety, no agitation.  Gastrointestinal: Colostomy/ileostomy present. No mass, no tenderness, no rigidity, non obese abdomen.      Complexity of Data:  Source Of History:  Patient, Outside Source  Records Review:   Previous Doctor Records, Previous Patient Records   PROCEDURES: None   ASSESSMENT:  Neurogenic bladder  Plan: Proceed with 200 units of intradetrusor Botox injection.  Risk of bleeding, infection, injury to surrounding structures discussed.

## 2021-10-30 NOTE — Progress Notes (Signed)
Upon assessment ; patient is nonverbal.   Contacted [facility] center; talked with caregiver Amy Georgina Pillion for all information listed in preop checklist.   Phone; Kathrene Alu [phone].    Call transport when ready to be released from hospital.   EMS [phone]

## 2021-10-30 NOTE — Anesthesia Preprocedure Evaluation (Addendum)
Anesthesia Evaluation  Patient identified by MRN, date of birth, ID band Patient awake    Reviewed: Allergy & Precautions, NPO status , Patient's Chart, lab work & pertinent test results  Airway Mallampati: III  TM Distance: >3 FB Neck ROM: Full    Dental  (+) Dental Advisory Given   Pulmonary former smoker,    breath sounds clear to auscultation       Cardiovascular hypertension, Pt. on medications + CAD, + Past MI and + Cardiac Stents   Rhythm:Regular Rate:Normal     Neuro/Psych Non verbal CVA    GI/Hepatic Neg liver ROS, GERD  ,  Endo/Other  diabetes, Type 2  Renal/GU Renal disease     Musculoskeletal   Abdominal   Peds  Hematology negative hematology ROS (+)   Anesthesia Other Findings   Reproductive/Obstetrics                           Anesthesia Physical Anesthesia Plan  ASA: 4  Anesthesia Plan: General   Post-op Pain Management: Minimal or no pain anticipated   Induction: Intravenous  PONV Risk Score and Plan: 2 and Dexamethasone, Ondansetron and Treatment may vary due to age or medical condition  Airway Management Planned: LMA  Additional Equipment: None  Intra-op Plan:   Post-operative Plan: Extubation in OR  Informed Consent: I have reviewed the patients History and Physical, chart, labs and discussed the procedure including the risks, benefits and alternatives for the proposed anesthesia with the patient or authorized representative who has indicated his/her understanding and acceptance.     Dental advisory given and Consent reviewed with POA  Plan Discussed with: CRNA  Anesthesia Plan Comments:       Anesthesia Quick Evaluation

## 2021-10-30 NOTE — Anesthesia Procedure Notes (Addendum)
Procedure Name: LMA Insertion Date/Time: 10/30/2021 9:59 AM  Performed by: Briant Sites, CRNAPre-anesthesia Checklist: Patient identified, Emergency Drugs available, Suction available and Patient being monitored Patient Re-evaluated:Patient Re-evaluated prior to induction Oxygen Delivery Method: Circle system utilized Preoxygenation: Pre-oxygenation with 100% oxygen Induction Type: IV induction Ventilation: Mask ventilation without difficulty LMA: LMA inserted LMA Size: 4.0 Number of attempts: 1 Airway Equipment and Method: Bite block Placement Confirmation: positive ETCO2 Tube secured with: Tape Dental Injury: Teeth and Oropharynx as per pre-operative assessment  Comments: Poor dentition.  No change after  LMA placement

## 2021-10-30 NOTE — Anesthesia Postprocedure Evaluation (Signed)
Anesthesia Post Note  Patient: Albert Jensen  Procedure(s) Performed: CYSTOSCOPY BOTOX INJECTION, SUPRAPUBIC TUBE EXCHANGE     Patient location during evaluation: PACU Anesthesia Type: General Level of consciousness: awake and alert Pain management: pain level controlled Vital Signs Assessment: post-procedure vital signs reviewed and stable Respiratory status: spontaneous breathing, nonlabored ventilation, respiratory function stable and patient connected to nasal cannula oxygen Cardiovascular status: blood pressure returned to baseline and stable Postop Assessment: no apparent nausea or vomiting Anesthetic complications: no   No notable events documented.  Last Vitals:  Vitals:   10/30/21 1100 10/30/21 1109  BP: 129/78 111/79  Pulse: 86   Resp: 12 14  Temp: 36.4 C 36.4 C  SpO2: 94% 97%    Last Pain:  Vitals:   10/30/21 1109  TempSrc: Oral  PainSc:                  Kennieth Rad

## 2021-10-30 NOTE — Transfer of Care (Signed)
Immediate Anesthesia Transfer of Care Note  Patient: Albert Jensen  Procedure(s) Performed: CYSTOSCOPY BOTOX INJECTION, SUPRAPUBIC TUBE EXCHANGE  Patient Location: PACU  Anesthesia Type:General  Level of Consciousness: awake  Airway & Oxygen Therapy: Patient Spontanous Breathing and Patient connected to face mask oxygen  Post-op Assessment: Report given to RN and Post -op Vital signs reviewed and stable  Post vital signs: Reviewed and stable  Last Vitals:  Vitals Value Taken Time  BP 111/77 10/30/21 1033  Temp    Pulse 88 10/30/21 1035  Resp 17 10/30/21 1035  SpO2 100 % 10/30/21 1035  Vitals shown include unvalidated device data.  Last Pain:  Vitals:   10/30/21 0924  TempSrc:   PainSc: 0-No pain         Complications: No notable events documented.

## 2021-10-30 NOTE — Op Note (Signed)
Operative Note  Preoperative diagnosis:  1.  Neurogenic bladder  Postoperative diagnosis: 1.  Neurogenic bladder  Procedure(s): 1.  Cystoscopy with 200 units of intradetrusor Botox 2.  Simple suprapubic tube exchange  Surgeon: Modena Slater, MD  Assistants: None  Anesthesia: General  Complications: None immediate  EBL: Minimal  Specimens: 1.  None  Drains/Catheters: 1.  16 French suprapubic tube catheter  Intraoperative findings: 1.  Normal urethra except for some traumatic hypospadias. 2.  Bladder mucosa with some catheter edema but no tumors or masses.    Indication: 51 year old male with neurogenic bladder and persistent leakage with suprapubic catheter presents for intradetrusor Botox.  Description of procedure:  The patient was identified and consent was obtained.  The patient was taken to the operating room and placed in the supine position.  The patient was placed under general anesthesia.  Perioperative antibiotics were administered.  The patient was placed in dorsal lithotomy.  Patient was prepped and draped in a standard sterile fashion and a timeout was performed.  A 21 French cystoscope was advanced into the urethra and into the bladder.  Complete cystoscopy was performed with the findings noted above.  200 units of Botox was systematically injected throughout the bladder.  I inspected the bladder mucosa and there was no significant active bleeding.  I withdrew the scope and placed a 16 French suprapubic tube catheter through the suprapubic tube tract.  I placed 10 cc of sterile water into the catheter balloon.  This concluded the operation.  Patient tolerated the procedure well and stable postoperative.  Plan: He will likely need repeat Botox in 6 to 9 months.

## 2021-10-31 ENCOUNTER — Encounter (HOSPITAL_COMMUNITY): Payer: Self-pay | Admitting: Urology

## 2021-11-15 ENCOUNTER — Ambulatory Visit: Payer: Medicare Other | Admitting: Internal Medicine

## 2021-12-14 ENCOUNTER — Encounter: Payer: Self-pay | Admitting: *Deleted

## 2021-12-14 ENCOUNTER — Encounter: Payer: Self-pay | Admitting: Internal Medicine

## 2021-12-14 ENCOUNTER — Ambulatory Visit (INDEPENDENT_AMBULATORY_CARE_PROVIDER_SITE_OTHER): Payer: Medicare Other | Admitting: Internal Medicine

## 2021-12-14 ENCOUNTER — Telehealth: Payer: Self-pay | Admitting: *Deleted

## 2021-12-14 VITALS — BP 121/77 | HR 97 | Temp 97.8°F | Ht 74.0 in | Wt 210.0 lb

## 2021-12-14 DIAGNOSIS — Z1211 Encounter for screening for malignant neoplasm of colon: Secondary | ICD-10-CM | POA: Diagnosis not present

## 2021-12-14 DIAGNOSIS — R112 Nausea with vomiting, unspecified: Secondary | ICD-10-CM

## 2021-12-14 DIAGNOSIS — K5904 Chronic idiopathic constipation: Secondary | ICD-10-CM | POA: Diagnosis not present

## 2021-12-14 DIAGNOSIS — R1032 Left lower quadrant pain: Secondary | ICD-10-CM

## 2021-12-14 MED ORDER — METOCLOPRAMIDE HCL 5 MG PO TABS: 5.0000 mg | ORAL_TABLET | Freq: Three times a day (TID) | ORAL | 5 refills | Status: AC

## 2021-12-14 NOTE — Telephone Encounter (Signed)
Patient has been scheduled for 01/15/22 at 9:30 am. Instructions faxed

## 2021-12-14 NOTE — Patient Instructions (Signed)
We will schedule you for upper endoscopy today to further evaluate your chronic nausea vomiting abdominal pain.  I am going to start you on a new medication called metoclopramide 5 mg 3 times daily to see how you do.  Further recommendations to follow.  It was very nice meeting you today.  Dr. Marletta Lor

## 2021-12-14 NOTE — Progress Notes (Signed)
Primary Care Physician:  Bernerd Limbo, MD Primary Gastroenterologist:  Dr. Marletta Lor  Chief Complaint  Patient presents with   Nausea    Has been having issues with nausea and vomiting for about a month now.     HPI:   Albert Jensen is a 51 y.o. male who presents to the clinic today by referral from his PCP Dr. Eden Emms for evaluation.  Patient is nonverbal.  Answers yes and no questions with his hands.  51 year old male with history cardiac arrest in October 2016 with protracted downtime of about 15 minutes, subsequent drug-eluting stent to the RCA but apparently suffered a stroke or anoxic brain injury.  Also history of diabetes, hypertension, chronic CNS deficit, depression, chronic renal insufficiency.  Has a history of diverting colostomy due to sacral decubital ulcer and subsequent osteomyelitis.  He was in the hospital for nearly 150 days for treatment of this.  I called patient's nurse Aurea Graff [phone]) to gather further information on why patient is here today.  Apparently he has nausea and vomiting daily.  Has a basin with him in case he becomes sick.  Happens on a daily basis.  Takes Zofran without much improvement.  Not related to meals.  Actually his appetite is "very good."  She does state that he tends to eat too fast and they try to get him to slow down.  Patient agrees that he eats too fast.  Patient also complains of abdominal pain.  Primarily points left lower quadrant where his ostomy is.  No melena hematochezia.  Does have constipation for which she takes stool softener daily.  No overt dysphagia odynophagia.  Denies any heartburn or acid reflux for me today.  Does have diabetes and Aurea Graff tells me that he refuses medications for this.  Past Medical History:  Diagnosis Date   Anemia    Anoxic encephalopathy (HCC)    Aphasia following cerebral infarction    BPH with obstruction/lower urinary tract symptoms    CAD (coronary artery disease)    DES proximal RCA  and aspiration thrombectomy mid RCA and PLB 01/2015   Cardiac arrest [facility])    October 2016 with STEMI   Cataract, bilateral    Chronic kidney disease    Cognitive communication deficit    Colostomy in place (HCC)    Dementia (HCC)    Depression    Diabetic neuropathy (HCC)    Essential hypertension    GERD (gastroesophageal reflux disease)    Hemiplegia (HCC)    History of blood transfusion    History of infection due to ESBL Escherichia coli    History of tracheostomy    Neuromuscular dysfunction of bladder    Osteomyelitis (HCC)    Paroxysmal A-fib (HCC)    Peripheral vascular disease (HCC)    Respiratory failure (HCC)    Prolonged ventilatory course, complicated by mucus plugging and ultimately required tracheostomy 10-02/2015   Restless leg syndrome    RVOT-VT (right ventricular outflow tract ventricular tachycardia) (HCC)    Evaluated by Dr. Ladona Ridgel in 2012   Seborrheic dermatitis    ST elevation myocardial infarction (STEMI) of inferolateral wall [facility])    October 2016    Stroke [facility])    Suprapubic catheter (HCC)    Type 2 diabetes mellitus (HCC)    Ventilator associated pneumonia (HCC)     Past Surgical History:  Procedure Laterality Date   BOTOX INJECTION N/A 10/30/2021   Procedure: CYSTOSCOPY BOTOX INJECTION, SUPRAPUBIC TUBE EXCHANGE;  Surgeon: Ray Church  III, MD;  Location: [facility];  Service: Urology;  Laterality: N/A;  45 MINS   CARDIAC CATHETERIZATION N/A 01/22/2015   Procedure: Left Heart Cath and Coronary Angiography;  Surgeon: Corky Crafts, MD;  Location[facility] INVASIVE CV LAB;  Service: Cardiovascular;  Laterality: N/A;   CARDIAC CATHETERIZATION N/A 01/22/2015   Procedure: Coronary Stent Intervention;  Surgeon: Corky Crafts, MD;  Location: [facility] INVASIVE CV LAB;  Service: Cardiovascular;  Laterality: N/A;  prox rca    Current Outpatient Medications  Medication Sig Dispense Refill   amLODipine (NORVASC) 10 MG tablet Take 10 mg by mouth in the  morning.     Emollient (CETAPHIL) cream Apply 1 Application topically every 12 (twelve) hours as needed (hydration of skin (groin, buttocks & perineum)).     Menthol-Zinc Oxide (CALMOSEPTINE) 0.44-20.6 % OINT Apply 1 Application topically every 8 (eight) hours as needed (protection (groin/buttocks/perineum)).     mirabegron ER (MYRBETRIQ) 50 MG TB24 tablet Take 50 mg by mouth in the morning.     ondansetron (ZOFRAN) 4 MG tablet Take 4 mg by mouth every 4 (four) hours as needed for nausea/vomiting.     sennosides-docusate sodium (SENOKOT-S) 8.6-50 MG tablet Take 2 tablets by mouth daily.     solifenacin (VESICARE) 5 MG tablet Take 5 mg by mouth in the morning.     No current facility-administered medications for this visit.    Allergies as of 12/14/2021   (No Known Allergies)    Family History  Problem Relation Age of Onset   Heart disease Mother 81       MI   Diabetes Father     Social History   Socioeconomic History   Marital status: Single    Spouse name: Not on file   Number of children: Not on file   Years of education: Not on file   Highest education level: Not on file  Occupational History   Not on file  Tobacco Use   Smoking status: Former    Types: Cigarettes    Quit date: 05/01/2005    Years since quitting: 16.6   Smokeless tobacco: Never  Substance and Sexual Activity   Alcohol use: Not Currently    Alcohol/week: 1.0 standard drink of alcohol    Types: 1 Cans of beer per week   Drug use: Not Currently   Sexual activity: Not Currently  Other Topics Concern   Not on file  Social History Narrative   Not on file   Social Determinants of Health   Financial Resource Strain: Not on file  Food Insecurity: Not on file  Transportation Needs: Not on file  Physical Activity: Not on file  Stress: Not on file  Social Connections: Not on file  Intimate Partner Violence: Not on file    Subjective: Review of Systems  Constitutional:  Negative for chills and  fever.  HENT:  Negative for congestion and hearing loss.   Eyes:  Negative for blurred vision and double vision.  Respiratory:  Negative for cough and shortness of breath.   Cardiovascular:  Negative for chest pain and palpitations.  Gastrointestinal:  Positive for abdominal pain, nausea and vomiting. Negative for blood in stool, constipation, diarrhea, heartburn and melena.  Genitourinary:  Negative for dysuria and urgency.  Musculoskeletal:  Negative for joint pain and myalgias.  Skin:  Negative for itching and rash.  Neurological:  Negative for dizziness and headaches.  Psychiatric/Behavioral:  Negative for depression. The patient is not nervous/anxious.  Objective: BP 121/77 (BP Location: Left Arm, Patient Position: Sitting, Cuff Size: Normal)   Pulse 97   Temp 97.8 F (36.6 C) (Temporal)   Ht 6\' 2"  (1.88 m) Comment: facility reported  Wt 210 lb (95.3 kg) Comment: facility reported  SpO2 96%   BMI 26.96 kg/m  Physical Exam Constitutional:      Appearance: Normal appearance.     Comments: Wheelchair bound  HENT:     Head: Normocephalic and atraumatic.  Eyes:     Extraocular Movements: Extraocular movements intact.     Conjunctiva/sclera: Conjunctivae normal.  Cardiovascular:     Rate and Rhythm: Normal rate and regular rhythm.  Pulmonary:     Effort: Pulmonary effort is normal.     Breath sounds: Normal breath sounds.  Abdominal:     General: Bowel sounds are normal.     Palpations: Abdomen is soft.  Musculoskeletal:        General: Normal range of motion.     Cervical back: Normal range of motion and neck supple.  Skin:    General: Skin is warm.  Neurological:     General: No focal deficit present.     Mental Status: He is alert and oriented to person, place, and time.  Psychiatric:        Mood and Affect: Mood normal.        Behavior: Behavior normal.     Comments: nonverbal      Assessment: *Nausea and vomiting *Abdominal  pain *Constipation-well-controlled *Colon cancer screening  Plan: Will schedule for EGD to evaluate for peptic ulcer disease, esophagitis, gastritis, H. Pylori, duodenitis, or other. Will also evaluate for esophageal stricture, Schatzki's ring, esophageal web or other.   The risks including infection, bleed, or perforation as well as benefits, limitations, alternatives and imponderables have been reviewed with the patient. Potential for esophageal dilation, biopsy, etc. have also been reviewed.  Questions have been answered. All parties agreeable.  Nausea vomiting could be related to diabetic gastroparesis.  Will start on Reglan 5 mg 3 times daily and see how he does.  Counseled on small risk of tar dive dyskinesia like side effects and he understands.  If he exhibits any symptoms, need to stop medication immediately.  Continue Zofran as needed.  Constipation well controlled on current regimen.  Continue.  Discussed colonoscopy for colon cancer screening.  I do not think he would be able to tolerate the prep at this moment given his ongoing nausea and vomiting.  We will hold off for now.  Thank you Dr. for the kind referral.   12/14/2021 8:49 AM   Disclaimer: This note was dictated with voice recognition software. Similar sounding words can inadvertently be transcribed and may not be corrected upon review.

## 2021-12-14 NOTE — Telephone Encounter (Signed)
Called [facility] and advised need to speakw with Belinda to schedule EGD. LMTCB for Belinda, asa 3

## 2022-01-09 NOTE — Progress Notes (Signed)
Mr Albert Jensen is a resident at [facility] in [place].  I Spoke with Murrell Redden and gave her instructions for his procedure 01/15/2022.  He will be transported via  Non-EMS at 7 am.  Duval Macleod, his brother is his legal guardian. Attempted to call to obtain phone consent.

## 2022-01-09 NOTE — Progress Notes (Signed)
Non EM EMS unable to transport pt at 7 am.  Patient's time to arrive Changed to 11:15.  Called to [employer] at [facility]

## 2022-01-10 ENCOUNTER — Encounter (HOSPITAL_COMMUNITY)
Admission: RE | Admit: 2022-01-10 | Discharge: 2022-01-10 | Disposition: A | Discharge status: Home / Self Care | Payer: Medicare Other | Source: Ambulatory Visit | Attending: Internal Medicine | Admitting: Internal Medicine

## 2022-01-15 ENCOUNTER — Ambulatory Visit (HOSPITAL_COMMUNITY): Payer: Medicare Other | Admitting: Anesthesiology

## 2022-01-15 ENCOUNTER — Ambulatory Visit (HOSPITAL_BASED_OUTPATIENT_CLINIC_OR_DEPARTMENT_OTHER): Payer: Medicare Other | Admitting: Anesthesiology

## 2022-01-15 ENCOUNTER — Encounter (HOSPITAL_COMMUNITY)
Admission: RE | Disposition: A | Discharge status: Home Health | Payer: Self-pay | Source: Ambulatory Visit | Attending: Internal Medicine

## 2022-01-15 ENCOUNTER — Encounter (HOSPITAL_COMMUNITY): Payer: Self-pay

## 2022-01-15 ENCOUNTER — Ambulatory Visit (HOSPITAL_COMMUNITY)
Admission: RE | Admit: 2022-01-15 | Discharge: 2022-01-15 | Disposition: A | Discharge status: Home Health | Payer: Medicare Other | Source: Ambulatory Visit | Attending: Internal Medicine | Admitting: Internal Medicine

## 2022-01-15 DIAGNOSIS — K2100 Gastro-esophageal reflux disease with esophagitis, without bleeding: Secondary | ICD-10-CM | POA: Diagnosis not present

## 2022-01-15 DIAGNOSIS — Z87891 Personal history of nicotine dependence: Secondary | ICD-10-CM | POA: Diagnosis not present

## 2022-01-15 DIAGNOSIS — I2510 Atherosclerotic heart disease of native coronary artery without angina pectoris: Secondary | ICD-10-CM | POA: Insufficient documentation

## 2022-01-15 DIAGNOSIS — K2970 Gastritis, unspecified, without bleeding: Secondary | ICD-10-CM | POA: Diagnosis not present

## 2022-01-15 DIAGNOSIS — I129 Hypertensive chronic kidney disease with stage 1 through stage 4 chronic kidney disease, or unspecified chronic kidney disease: Secondary | ICD-10-CM | POA: Insufficient documentation

## 2022-01-15 DIAGNOSIS — I252 Old myocardial infarction: Secondary | ICD-10-CM

## 2022-01-15 DIAGNOSIS — I480 Paroxysmal atrial fibrillation: Secondary | ICD-10-CM | POA: Insufficient documentation

## 2022-01-15 DIAGNOSIS — K2950 Unspecified chronic gastritis without bleeding: Secondary | ICD-10-CM | POA: Diagnosis not present

## 2022-01-15 DIAGNOSIS — R112 Nausea with vomiting, unspecified: Secondary | ICD-10-CM | POA: Insufficient documentation

## 2022-01-15 DIAGNOSIS — E1122 Type 2 diabetes mellitus with diabetic chronic kidney disease: Secondary | ICD-10-CM | POA: Insufficient documentation

## 2022-01-15 DIAGNOSIS — K2210 Ulcer of esophagus without bleeding: Secondary | ICD-10-CM | POA: Diagnosis not present

## 2022-01-15 DIAGNOSIS — K319 Disease of stomach and duodenum, unspecified: Secondary | ICD-10-CM | POA: Insufficient documentation

## 2022-01-15 DIAGNOSIS — E1140 Type 2 diabetes mellitus with diabetic neuropathy, unspecified: Secondary | ICD-10-CM | POA: Diagnosis not present

## 2022-01-15 DIAGNOSIS — N189 Chronic kidney disease, unspecified: Secondary | ICD-10-CM | POA: Insufficient documentation

## 2022-01-15 DIAGNOSIS — R109 Unspecified abdominal pain: Secondary | ICD-10-CM

## 2022-01-15 DIAGNOSIS — E1151 Type 2 diabetes mellitus with diabetic peripheral angiopathy without gangrene: Secondary | ICD-10-CM | POA: Insufficient documentation

## 2022-01-15 DIAGNOSIS — Z955 Presence of coronary angioplasty implant and graft: Secondary | ICD-10-CM | POA: Insufficient documentation

## 2022-01-15 DIAGNOSIS — K2289 Other specified disease of esophagus: Secondary | ICD-10-CM | POA: Diagnosis not present

## 2022-01-15 DIAGNOSIS — I10 Essential (primary) hypertension: Secondary | ICD-10-CM | POA: Insufficient documentation

## 2022-01-15 DIAGNOSIS — R1013 Epigastric pain: Secondary | ICD-10-CM | POA: Insufficient documentation

## 2022-01-15 HISTORY — PX: BIOPSY: SHX5522

## 2022-01-15 HISTORY — PX: ESOPHAGOGASTRODUODENOSCOPY (EGD) WITH PROPOFOL: SHX5813

## 2022-01-15 SURGERY — ESOPHAGOGASTRODUODENOSCOPY (EGD) WITH PROPOFOL
Anesthesia: General

## 2022-01-15 MED ORDER — PROPOFOL 10 MG/ML IV BOLUS
INTRAVENOUS | Status: DC | PRN
  Administered 2022-01-15: 50 mg via INTRAVENOUS
  Administered 2022-01-15: 30 mg via INTRAVENOUS
  Administered 2022-01-15: 20 mg via INTRAVENOUS

## 2022-01-15 MED ORDER — PANTOPRAZOLE SODIUM 40 MG PO TBEC: 40.0000 mg | DELAYED_RELEASE_TABLET | Freq: Two times a day (BID) | ORAL | 11 refills | Status: DC

## 2022-01-15 MED ORDER — LIDOCAINE HCL (CARDIAC) PF 100 MG/5ML IV SOSY
PREFILLED_SYRINGE | INTRAVENOUS | Status: DC | PRN
  Administered 2022-01-15: 50 mg via INTRATRACHEAL

## 2022-01-15 MED ORDER — LACTATED RINGERS IV SOLN: INTRAVENOUS | Status: DC

## 2022-01-15 NOTE — Discharge Instructions (Addendum)
EGD Discharge instructions Please read the instructions outlined below and refer to this sheet in the next few weeks. These discharge instructions provide you with general information on caring for yourself after you leave the hospital. Your doctor may also give you specific instructions. While your treatment has been planned according to the most current medical practices available, unavoidable complications occasionally occur. If you have any problems or questions after discharge, please call your doctor. ACTIVITY You may resume your regular activity but move at a slower pace for the next 24 hours.  Take frequent rest periods for the next 24 hours.  Walking will help expel (get rid of) the air and reduce the bloated feeling in your abdomen.  No driving for 24 hours (because of the anesthesia (medicine) used during the test).  You may shower.  Do not sign any important legal documents or operate any machinery for 24 hours (because of the anesthesia used during the test).  NUTRITION Drink plenty of fluids.  You may resume your normal diet.  Begin with a light meal and progress to your normal diet.  Avoid alcoholic beverages for 24 hours or as instructed by your caregiver.  MEDICATIONS You may resume your normal medications unless your caregiver tells you otherwise.  WHAT YOU CAN EXPECT TODAY You may experience abdominal discomfort such as a feeling of fullness or "gas" pains.  FOLLOW-UP Your doctor will discuss the results of your test with you.  SEEK IMMEDIATE MEDICAL ATTENTION IF ANY OF THE FOLLOWING OCCUR: Excessive nausea (feeling sick to your stomach) and/or vomiting.  Severe abdominal pain and distention (swelling).  Trouble swallowing.  Temperature over 101 F (37.8 C).  Rectal bleeding or vomiting of blood.   Your EGD revealed a significant amount of inflammation in your esophagus and stomach.  I took biopsies of both.  I am going to start a new medication called pantoprazole 40  mg twice daily.  Avoid NSAIDs.  This will take time to heal.  Await pathology results, my office will contact you.  Follow-up with GI in 2 to 3 months.   I hope you have a great rest of your week!  Elon Alas. Abbey Chatters, D.O. Gastroenterology and Hepatology [facility]

## 2022-01-15 NOTE — Anesthesia Postprocedure Evaluation (Signed)
Anesthesia Post Note  Patient: Ruble Pumphrey  Procedure(s) Performed: ESOPHAGOGASTRODUODENOSCOPY (EGD) WITH PROPOFOL BIOPSY  Patient location during evaluation: Phase II Anesthesia Type: General Level of consciousness: awake and alert and oriented Pain management: pain level controlled Vital Signs Assessment: post-procedure vital signs reviewed and stable Respiratory status: spontaneous breathing, nonlabored ventilation and respiratory function stable Cardiovascular status: blood pressure returned to baseline and stable Postop Assessment: no apparent nausea or vomiting Anesthetic complications: no   No notable events documented.   Last Vitals:  Vitals:   01/15/22 1048 01/15/22 1202  BP: 120/84 97/64  Pulse: 98 85  Resp: 19 16  Temp: 36.6 C 36.7 C  SpO2: 98% 95%    Last Pain:  Vitals:   01/15/22 1202  TempSrc: Oral  PainSc:                  Renaye Janicki C Sinjin Amero

## 2022-01-15 NOTE — H&P (Signed)
Primary Care Physician:  Hilbert Corrigan, MD Primary Gastroenterologist:  Dr. Abbey Chatters  Pre-Procedure History & Physical: HPI:  Albert Jensen is a 51 y.o. male is here for an EGD to be performed for nausea and vomiting and abdominal pain  Past Medical History:  Diagnosis Date   Anemia    Anoxic encephalopathy ([place])    Aphasia following cerebral infarction    BPH with obstruction/lower urinary tract symptoms    CAD (coronary artery disease)    DES proximal RCA and aspiration thrombectomy mid RCA and PLB 01/2015   Cardiac arrest [facility])    October 2016 with STEMI   Cataract, bilateral    Chronic kidney disease    Cognitive communication deficit    Colostomy in place ([place])    Dementia ([place])    Depression    Diabetic neuropathy ([place])    Essential hypertension    GERD (gastroesophageal reflux disease)    Hemiplegia ([place])    History of blood transfusion    History of infection due to ESBL Escherichia coli    History of tracheostomy    Neuromuscular dysfunction of bladder    Osteomyelitis (HCC)    Paroxysmal A-fib ([place])    Peripheral vascular disease ([place])    Respiratory failure ([place])    Prolonged ventilatory course, complicated by mucus plugging and ultimately required tracheostomy 10-02/2015   Restless leg syndrome    RVOT-VT (right ventricular outflow tract ventricular tachycardia) ([place])    Evaluated by Dr. Lovena Le in 2012   Seborrheic dermatitis    ST elevation myocardial infarction (STEMI) of inferolateral wall [facility])    October 2016    Stroke [facility])    Suprapubic catheter ([place])    Type 2 diabetes mellitus ([place])    Ventilator associated pneumonia ([place])     Past Surgical History:  Procedure Laterality Date   BOTOX INJECTION N/A 10/30/2021   Procedure: CYSTOSCOPY BOTOX INJECTION, SUPRAPUBIC TUBE EXCHANGE;  Surgeon: Lucas Mallow, MD;  Location: [facility]S;  Service: Urology;  Laterality: N/A;  [place] N/A 01/22/2015   Procedure: Left Heart  Cath and Coronary Angiography;  Surgeon: Jettie Booze, MD;  Location: [place] CV LAB;  Service: Cardiovascular;  Laterality: N/A;   CARDIAC CATHETERIZATION N/A 01/22/2015   Procedure: Coronary Stent Intervention;  Surgeon: Jettie Booze, MD;  Location: [place] CV LAB;  Service: Cardiovascular;  Laterality: N/A;  prox rca    Prior to Admission medications   Medication Sig Start Date End Date Taking? Authorizing Provider  amLODipine (NORVASC) 10 MG tablet Take 10 mg by mouth in the morning. (0800)   Yes [provider]  Emollient (CETAPHIL) cream Apply 1 Application topically every 12 (twelve) hours as needed (hydration of skin (groin, buttocks & perineum)).   Yes [provider]  Menthol-Zinc Oxide (CALMOSEPTINE) 0.44-20.6 % OINT Apply 1 Application topically every 8 (eight) hours as needed (protection (groin/buttocks/perineum)).   Yes [provider]  metoCLOPramide (REGLAN) 5 MG tablet Take 1 tablet (5 mg total) by mouth 3 (three) times daily before meals. 12/14/21 06/12/22 Yes Rockey Guarino K, DO  mirabegron ER (MYRBETRIQ) 50 MG TB24 tablet Take 50 mg by mouth in the morning. (0800)   Yes [provider]  ondansetron (ZOFRAN) 4 MG tablet Take 4 mg by mouth every 4 (four) hours as needed for nausea/vomiting. 09/14/21  Yes [provider]  sennosides-docusate sodium (SENOKOT-S) 8.6-50 MG tablet Take 2 tablets by mouth daily. (0800)   Yes  [provider]  solifenacin (VESICARE) 5 MG tablet Take 5 mg by mouth in the morning. (0800)   Yes [provider]    Allergies as of 12/14/2021   (No Known Allergies)    Family History  Problem Relation Age of Onset   Heart disease Mother 11       MI   Diabetes Father     Social History   Socioeconomic History   Marital status: Single    Spouse name: Not on file   Number of children: Not on file   Years of education: Not on file   Highest education level: Not on file   Occupational History   Not on file  Tobacco Use   Smoking status: Former    Types: Cigarettes    Quit date: 05/01/2005    Years since quitting: 16.7   Smokeless tobacco: Never  Substance and Sexual Activity   Alcohol use: Not Currently    Alcohol/week: 1.0 standard drink of alcohol    Types: 1 Cans of beer per week   Drug use: Not Currently   Sexual activity: Not Currently  Other Topics Concern   Not on file  Social History Narrative   Not on file   Social Determinants of Health   Financial Resource Strain: Not on file  Food Insecurity: Not on file  Transportation Needs: Not on file  Physical Activity: Not on file  Stress: Not on file  Social Connections: Not on file  Intimate Partner Violence: Not on file    Review of Systems: General: Negative for fever, chills, fatigue, weakness. Eyes: Negative for vision changes.  ENT: Negative for hoarseness, difficulty swallowing , nasal congestion. CV: Negative for chest pain, angina, palpitations, dyspnea on exertion, peripheral edema.  Respiratory: Negative for dyspnea at rest, dyspnea on exertion, cough, sputum, wheezing.  GI: See history of present illness. GU:  Negative for dysuria, hematuria, urinary incontinence, urinary frequency, nocturnal urination.  MS: Negative for joint pain, low back pain.  Derm: Negative for rash or itching.  Neuro: Negative for weakness, abnormal sensation, seizure, frequent headaches, memory loss, confusion.  Psych: Negative for anxiety, depression Endo: Negative for unusual weight change.  Heme: Negative for bruising or bleeding. Allergy: Negative for rash or hives.  Physical Exam: Vital signs in last 24 hours:     General:   Alert,  Well-developed, well-nourished, pleasant and cooperative in NAD Head:  Normocephalic and atraumatic. Eyes:  Sclera clear, no icterus.   Conjunctiva pink. Ears:  Normal auditory acuity. Nose:  No deformity, discharge,  or lesions. Mouth:  No deformity or  lesions, dentition normal. Neck:  Supple; no masses or thyromegaly. Lungs:  Clear throughout to auscultation.   No wheezes, crackles, or rhonchi. No acute distress. Heart:  Regular rate and rhythm; no murmurs, clicks, rubs,  or gallops. Abdomen:  Soft, nontender and nondistended. No masses, hepatosplenomegaly or hernias noted. Normal bowel sounds, without guarding, and without rebound.   Msk:  Symmetrical without gross deformities. Normal posture. Extremities:  Without clubbing or edema. Neurologic:  Alert and  oriented x4;  grossly normal neurologically. Skin:  Intact without significant lesions or rashes. Cervical Nodes:  No significant cervical adenopathy. Psych:  Alert and cooperative. Normal mood and affect.   Impression/Plan: Albert Jensen is here for an EGD to be performed for nausea and vomiting and abdominal pain  Risks, benefits, limitations, imponderables and alternatives regarding EGD have been reviewed with the patient. Questions have been answered. All parties agreeable.

## 2022-01-15 NOTE — Op Note (Signed)
[facility] Patient Name: Albert Jensen Procedure Date: 01/15/2022 11:33 AM MRN: [id number] Date of Birth: 08-Feb-1971 Attending MD: Elon Alas. Abbey Chatters DO CSN: [id number] Age: 51 Admit Type: Outpatient Procedure:                Upper GI endoscopy Indications:              Epigastric abdominal pain, Nausea with vomiting Providers:                Elon Alas. Abbey Chatters, DO, Caprice Kluver, Everardo Pacific Referring MD:              Medicines:                See the Anesthesia note for documentation of the                            administered medications Complications:            No immediate complications. Estimated Blood Loss:     Estimated blood loss was minimal. Procedure:                Pre-Anesthesia Assessment:                           - The anesthesia plan was to use monitored                            anesthesia care (MAC).                           After obtaining informed consent, the endoscope was                            passed under direct vision. Throughout the                            procedure, the patient's blood pressure, pulse, and                            oxygen saturations were monitored continuously. The                            [id number]-H190 ([id number]) scope was introduced through the                            mouth, and advanced to the second part of duodenum.                            The upper GI endoscopy was accomplished without                            difficulty. The patient tolerated the procedure                            well. Scope In: 11:52:45 AM Scope Out: 11:58:18 AM Total Procedure Duration: 0 hours 5 minutes 33 seconds  Findings:      LA Grade C (one or more  mucosal breaks continuous between tops of 2 or       more mucosal folds, less than 75% circumference) esophagitis with no       bleeding was found in the lower third of the esophagus. Biopsies were       taken with a cold forceps for histology.      The Z-line was irregular and was  found 40 cm from the incisors.      Patchy moderate inflammation characterized by erosions and erythema was       found in the gastric body and in the gastric antrum. Biopsies were taken       with a cold forceps for Helicobacter pylori testing.      The duodenal bulb and second portion of the duodenum were normal. Impression:               - LA Grade C reflux esophagitis with no bleeding.                            Biopsied.                           - Z-line irregular, 40 cm from the incisors.                           - Gastritis. Biopsied.                           - Normal duodenal bulb and second portion of the                            duodenum. Moderate Sedation:      Per Anesthesia Care Recommendation:           - Patient has a contact number available for                            emergencies. The signs and symptoms of potential                            delayed complications were discussed with the                            patient. Return to normal activities tomorrow.                            Written discharge instructions were provided to the                            patient.                           - Resume previous diet.                           - Continue present medications.                           - Await pathology results.                           -  Repeat upper endoscopy in 3 months to evaluate                            the response to therapy and re biopsy for Barrett's                           - Return to GI clinic in 6-8 weeks                           - Use Protonix (pantoprazole) 40 mg PO BID. Procedure Code(s):        --- Professional ---                           [phone], Esophagogastroduodenoscopy, flexible,                            transoral; with biopsy, single or multiple Diagnosis Code(s):        --- Professional ---                           K21.00, Gastro-esophageal reflux disease with                            esophagitis, without  bleeding                           K22.8, Other specified diseases of esophagus                           K29.70, Gastritis, unspecified, without bleeding                           R10.13, Epigastric pain                           R11.2, Nausea with vomiting, unspecified CPT copyright 2019 American Medical Association. All rights reserved. The codes documented in this report are preliminary and upon coder review may  be revised to meet current compliance requirements. Elon Alas. Abbey Chatters, DO [place] Abbey Chatters, DO 01/15/2022 12:02:32 PM This report has been signed electronically. Number of Addenda: 0

## 2022-01-15 NOTE — Anesthesia Preprocedure Evaluation (Addendum)
Anesthesia Evaluation  Patient identified by MRN, date of birth, ID band Patient awake    Reviewed: Allergy & Precautions, NPO status , Patient's Chart, lab work & pertinent test results  Airway Mallampati: III  TM Distance: <3 FB Neck ROM: Limited  Mouth opening: Limited Mouth Opening Comment: H/O Tracheostomy Dental  (+) Dental Advisory Given, Poor Dentition, Chipped, Missing   Pulmonary pneumonia, former smoker,  H/O Tracheostomy   Pulmonary exam normal breath sounds clear to auscultation       Cardiovascular Exercise Tolerance: Poor hypertension, Pt. on medications + CAD, + Past MI and + Peripheral Vascular Disease  Normal cardiovascular exam+ dysrhythmias Atrial Fibrillation  Rhythm:Regular Rate:Normal  30-Oct-2021 08:50:54 [facility] ROUTINE RECORD Aug 24, 1970 (32 yr) Male Caucasian Vent. rate 94 BPM PR interval 122 ms QRS duration 80 ms QT/QTcB 366/457 ms P-R-T axes 62 -46 -11 Normal sinus rhythm Left axis deviation Abnormal ECG When compared with ECG of 20-Oct-2017 11:29, No significant change was found Confirmed by Kathlyn [place] [phone]) on 11/02/2021 7:31:26 AM   Neuro/Psych PSYCHIATRIC DISORDERS Depression Dementia  Neuromuscular disease CVA    GI/Hepatic GERD  Medicated,Colostomy    Endo/Other  diabetes  Renal/GU Renal InsufficiencyRenal disease     Musculoskeletal   Abdominal   Peds  Hematology  (+) Blood dyscrasia, anemia ,   Anesthesia Other Findings   Reproductive/Obstetrics                           Anesthesia Physical Anesthesia Plan  ASA: 4  Anesthesia Plan: General   Post-op Pain Management: Minimal or no pain anticipated   Induction: Intravenous  PONV Risk Score and Plan: Propofol infusion  Airway Management Planned: Nasal Cannula and Natural Airway  Additional Equipment:   Intra-op Plan:   Post-operative Plan:   Informed  Consent: I have reviewed the patients History and Physical, chart, labs and discussed the procedure including the risks, benefits and alternatives for the proposed anesthesia with the patient or authorized representative who has indicated his/her understanding and acceptance.     Consent reviewed with POA and Dental advisory given  Plan Discussed with: CRNA and Surgeon  Anesthesia Plan Comments:        Anesthesia Quick Evaluation

## 2022-01-15 NOTE — Transfer of Care (Signed)
Immediate Anesthesia Transfer of Care Note  Patient: Albert Jensen  Procedure(s) Performed: ESOPHAGOGASTRODUODENOSCOPY (EGD) WITH PROPOFOL BIOPSY  Patient Location: Short Stay  Anesthesia Type:General  Level of Consciousness: sedated and patient cooperative  Airway & Oxygen Therapy: Patient Spontanous Breathing  Post-op Assessment: Report given to RN and Post -op Vital signs reviewed and stable  Post vital signs: Reviewed and stable  Last Vitals:  Vitals Value Taken Time  BP    Temp    Pulse    Resp    SpO2      Last Pain:  Vitals:   01/15/22 1048  TempSrc: Oral  PainSc: 0-No pain         Complications: No notable events documented.

## 2022-01-16 LAB — SURGICAL PATHOLOGY

## 2022-01-22 ENCOUNTER — Encounter (HOSPITAL_COMMUNITY): Payer: Self-pay | Admitting: Internal Medicine

## 2022-03-23 ENCOUNTER — Ambulatory Visit (INDEPENDENT_AMBULATORY_CARE_PROVIDER_SITE_OTHER): Payer: Medicare Other | Admitting: Gastroenterology

## 2022-03-23 ENCOUNTER — Encounter: Payer: Self-pay | Admitting: Gastroenterology

## 2022-03-23 VITALS — BP 107/73 | HR 97 | Temp 98.5°F | Ht 74.0 in | Wt 204.0 lb

## 2022-03-23 DIAGNOSIS — Z1211 Encounter for screening for malignant neoplasm of colon: Secondary | ICD-10-CM | POA: Diagnosis not present

## 2022-03-23 DIAGNOSIS — K2100 Gastro-esophageal reflux disease with esophagitis, without bleeding: Secondary | ICD-10-CM

## 2022-03-23 DIAGNOSIS — I2510 Atherosclerotic heart disease of native coronary artery without angina pectoris: Secondary | ICD-10-CM | POA: Diagnosis not present

## 2022-03-23 DIAGNOSIS — K219 Gastro-esophageal reflux disease without esophagitis: Secondary | ICD-10-CM | POA: Insufficient documentation

## 2022-03-23 NOTE — Patient Instructions (Addendum)
Will continue metoclopramide before meals for now. If you notice any unusual movements of the mouth/face (tardive dyskinesia), stop the medication immediately. Continue pantoprazole 40mg  twice daily.  Collect stool for Cologuard testing.  Return to the office in 4 months.

## 2022-03-23 NOTE — Progress Notes (Signed)
GI Office Note    Referring Provider: Bernerd Limbo* Primary Care Physician:  Bernerd Limbo, MD  Primary Gastroenterologist: Hennie Duos. Marletta Lor, DO   Chief Complaint   Chief Complaint  Patient presents with   Follow-up    Pt is non-verbal. Using hand gestures, pt stated that he is no longer having issues with nausea/vomiting.    History of Present Illness   Albert Jensen is a 51 y.o. male presenting today for follow up of N/V. Patient is nonverbal. Answers yes and no questions with his hands.   51 year old male with history cardiac arrest in October 2016 with protracted downtime of about 15 minutes, subsequent drug-eluting stent to the RCA but apparently suffered a stroke or anoxic brain injury.  Also history of diabetes, hypertension, chronic CNS deficit, depression, chronic renal insufficiency.   Has a history of diverting colostomy due to sacral decubital ulcer and subsequent osteomyelitis.  He was in the hospital for nearly 150 days for treatment of this.  Patient has history of N/V daily prior to last ov. He underwent EGD and was found to have LA Grade C reflux esophagitis and gastritis. He is on PPI BID and Reglan before meals. Patient says his N/V resolved. He denies abdominal pain, heartburn. Denies reports of blood in the stool. We discussed colon cancer screening. He is not interested in a colonoscopy at this time but is agreeable to a Cologuard test.    EGD 01/2022: - LA Grade C reflux esophagitis with no bleeding. Biopsied. Acute erosive esophagitis. - Z-line irregular, 40 cm from the incisors. - Gastritis. Biopsied. Reactive gastropathy with foveolar hyperplasia and minimal chronic gastritis. - Normal duodenal bulb and second portion of the duodenum    Medications   Current Outpatient Medications  Medication Sig Dispense Refill   amLODipine (NORVASC) 10 MG tablet Take 10 mg by mouth in the morning. (0800)     Emollient (CETAPHIL) cream  Apply 1 Application topically every 12 (twelve) hours as needed (hydration of skin (groin, buttocks & perineum)).     Menthol-Zinc Oxide (CALMOSEPTINE) 0.44-20.6 % OINT Apply 1 Application topically every 8 (eight) hours as needed (protection (groin/buttocks/perineum)).     metoCLOPramide (REGLAN) 5 MG tablet Take 1 tablet (5 mg total) by mouth 3 (three) times daily before meals. 90 tablet 5   mirabegron ER (MYRBETRIQ) 50 MG TB24 tablet Take 50 mg by mouth in the morning. (0800)     ondansetron (ZOFRAN) 4 MG tablet Take 4 mg by mouth every 4 (four) hours as needed for nausea/vomiting.     pantoprazole (PROTONIX) 40 MG tablet Take 1 tablet (40 mg total) by mouth 2 (two) times daily. 60 tablet 11   sennosides-docusate sodium (SENOKOT-S) 8.6-50 MG tablet Take 2 tablets by mouth daily. (0800)     solifenacin (VESICARE) 5 MG tablet Take 5 mg by mouth in the morning. (0800)     No current facility-administered medications for this visit.    Allergies   Allergies as of 03/23/2022   (No Known Allergies)      Review of Systems   General: Negative for anorexia, weight loss, fever, chills, fatigue, weakness. ENT: Negative for hoarseness, difficulty swallowing , nasal congestion. CV: Negative for chest pain, angina, palpitations, dyspnea on exertion, peripheral edema.  Respiratory: Negative for dyspnea at rest, dyspnea on exertion, cough, sputum, wheezing.  GI: See history of present illness. GU:  Negative for dysuria, hematuria, urinary incontinence, urinary frequency, nocturnal urination.  Endo: Negative for unusual  weight change.     Physical Exam   BP 107/73 (BP Location: Left Arm, Patient Position: Sitting, Cuff Size: Large)   Pulse 97   Temp 98.5 F (36.9 C) (Oral)   Ht 6\' 2"  (1.88 m)   Wt 204 lb (92.5 kg) Comment: Facility reported on 02/15/2022  SpO2 97%   BMI 26.19 kg/m    General: Well-nourished, well-developed in no acute distress.  Eyes: No icterus. Mouth: Oropharyngeal  mucosa moist and pink  Lungs: Clear to auscultation bilaterally.  Heart: Regular rate and rhythm, no murmurs rubs or gallops.  Abdomen: Bowel sounds are normal, nontender, nondistended,  no abdominal bruits or hernia , no rebound or guarding. Ostomy in left abdomen with brown stool present.  Rectal: not performed Extremities: No lower extremity edema. No clubbing or deformities. Neuro: Alert and oriented x 4   Skin: Warm and dry, no jaundice.   Psych: Alert and cooperative, normal mood and affect.  Labs   Lab Results  Component Value Date   WBC 6.1 10/30/2021   HGB 14.7 10/30/2021   HCT 45.4 10/30/2021   MCV 85.2 10/30/2021   PLT 220 10/30/2021   Lab Results  Component Value Date   CREATININE 1.11 10/30/2021   BUN 17 10/30/2021   NA 137 10/30/2021   K 3.7 10/30/2021   CL 105 10/30/2021   CO2 26 10/30/2021    Imaging Studies   No results found.  Assessment   GERD/N/V: doing better on reglan qac and PPI BID. Will continue current regimen for now. He is aware of risk of tardive dyskinesia but prefers not to change regimen at this time. We will consider possibility of changing reglan to prn use after next ov if he continues to do well.   Colon cancer screening: patient prefers cologuard testing.    PLAN   Continue pantoprazole 40mg  BID before meals.  Continue reglan 5mg  qac. Discontinue if signs of tardive dyskinesia.  Cologuard testing. Return ov in four months with Dr. 11/01/2021.    . , MHS, PA-C [facility]

## 2022-04-18 ENCOUNTER — Other Ambulatory Visit: Payer: Self-pay | Admitting: Urology

## 2022-04-18 NOTE — Patient Instructions (Signed)
Preop instructions for: Albert Jensen     Date of Birth: 01-16-1971                      Date of Procedure: Monday, 04/30/2022 Procedure:    CYSTOSCOPY BOTOX INJECTION  Surgeon: Dr. Link Snuffer Facility contact: Nanine Means    Phone:  [place]:  RN contact name/phone#:                          and Fax #:    Transportation contact phone#:    Time to arrive at [facility]: 9:30 AM   Report to: Admitting (On your left hand side)    Do not eat or drink past midnight the night before your procedure.(To include any tube feedings-must be discontinued)     Take these morning medications only with sips of water.(or give through gastrostomy or feeding tube).     Amlodipine Myrbetriq Sennosides docusate sodium Vesicare Pantoprazole Ondansetron if needed   Note: No Insulin or Diabetic meds should be given or taken the morning of the procedure!   Please send day of procedure:current med list and meds last taken that day, confirm nothing by mouth status from what time, Patient Demographic info( to include DNR status, problem list, allergies)   Bring Insurance card and picture ID Leave all jewelry and other valuables at place where living( no metal or rings to be worn) No contact lens   Men-no colognes,lotions   Any questions day of procedure,call  SHORT STAY-[phone]     Sent from :[facility]                   Phone:[phone]                   Fax:[phone]   Sent by :  Harlon Flor BSN, RN

## 2022-04-24 ENCOUNTER — Encounter (HOSPITAL_COMMUNITY): Payer: Self-pay | Admitting: Urology

## 2022-04-24 NOTE — Progress Notes (Signed)
COVID Vaccine Completed:  Yes  Date of COVID positive in last 90 days:  PCP - Tessie Fass, MD Cardiologist -  Johnny Bridge, MD (last saw cardiology 2016)  Chest x-ray -  EKG - 10-30-21 Epic Stress Test -  ECHO - 01-25-15 Epic Cardiac Cath - 01-22-15 Epic Pacemaker/ICD device last checked: Spinal Cord Stimulator:  Bowel Prep -   Sleep Study -  CPAP -   Fasting Blood Sugar - 100 to 169 Checks Blood Sugar - checked every Thursday  Last dose of GLP1 agonist-  N/A GLP1 instructions:  N/A   Last dose of SGLT-2 inhibitors-  N/A SGLT-2 instructions: N/A  Blood Thinner Instructions: N/A Aspirin Instructions: Last Dose:  Activity level:  Patient is nonambulatory, requires mechanical lift.  Requires assistance with all ADLs except for feeding   Anesthesia review:  Hx of Vtach and cardiac arrest, hx of MI, Afib, respiratory failure, DM, CKD, HRN  Patient denies shortness of breath, fever, cough and chest pain at PAT appointment  Patient verbalized understanding of instructions that were given to them at the PAT appointment. Patient was also instructed that they will need to review over the PAT instructions again at home before surgery.

## 2022-04-24 NOTE — Progress Notes (Signed)
Preop instructions for:     Welcome Albert Jensen Date of Birth:     April 24, 1970                  Date of Procedure:   04-30-22 Procedure:    Cystoscopy with Botox injection  Surgeon: Link Snuffer, MD Facility contact:     Phone:    [phone]             Health Care POA: Florentino Laabs [phone] RN contact name/phone#:                          and Fax #: [phone]   Transportation contact phone#: [facility] nonemergency EMS [phone]  Please send day of procedure:  Current med list  Medications taken the day of procedure (return attached form to hospital) confirm time of nothing by mouth status (return attached form to hospital) Patient Demographic info( to include DNR status, problem list, allergies) Bring Insurance card and picture ID    Time to arrive at [facility]: 9:30 AM   Report to: Admitting (On your left hand side)    Do not eat solid food or drink liquids past midnight the night before your procedure.(To include any tube feedings-must be discontinued)   Take these morning medications only with sips of water.(or give through gastrostomy or feeding tube).  Amlodipine Myrbetriq Vesicare Protonix Metoclopramide Zofran if needed   Note: No Insulin or Diabetic meds should be given or taken the morning of the procedure!  Oral Hygiene is also important to reduce your risk of infection.                                    Remember - BRUSH YOUR TEETH THE MORNING OF SURGERY WITH YOUR REGULAR TOOTHPASTE   DENTURES WILL BE REMOVED PRIOR TO SURGERY PLEASE DO NOT APPLY "Poly grip" OR ADHESIVES!!!  Leave all jewelry and other valuables at place where living( no metal or rings to be worn) No contact lens Women-no make-up, no lotions,perfumes,powders Men-no colognes,lotions   Any questions day of procedure,call  SHORT STAY-[phone]     Sent from :[facility]                   [place]                   [phone]   Sent by  :    Norvel Richards, RN

## 2022-04-25 NOTE — Progress Notes (Signed)
Albert Jensen from [place] called to confirm they received pre op instructions and they had no further questions at this time.

## 2022-04-30 ENCOUNTER — Other Ambulatory Visit: Payer: Self-pay

## 2022-04-30 ENCOUNTER — Ambulatory Visit (HOSPITAL_COMMUNITY)
Admission: RE | Admit: 2022-04-30 | Discharge: 2022-04-30 | Disposition: A | Discharge status: Home / Self Care | Payer: Medicare Other | Attending: Urology | Admitting: Urology

## 2022-04-30 ENCOUNTER — Encounter (HOSPITAL_COMMUNITY): Payer: Self-pay | Admitting: Urology

## 2022-04-30 ENCOUNTER — Ambulatory Visit (HOSPITAL_BASED_OUTPATIENT_CLINIC_OR_DEPARTMENT_OTHER): Payer: Medicare Other | Admitting: Anesthesiology

## 2022-04-30 ENCOUNTER — Encounter (HOSPITAL_COMMUNITY)
Admission: RE | Disposition: A | Discharge status: Home / Self Care | Payer: Self-pay | Source: Home / Self Care | Attending: Urology

## 2022-04-30 ENCOUNTER — Ambulatory Visit (HOSPITAL_COMMUNITY): Payer: Medicare Other | Admitting: Anesthesiology

## 2022-04-30 DIAGNOSIS — Z955 Presence of coronary angioplasty implant and graft: Secondary | ICD-10-CM | POA: Insufficient documentation

## 2022-04-30 DIAGNOSIS — Z8673 Personal history of transient ischemic attack (TIA), and cerebral infarction without residual deficits: Secondary | ICD-10-CM | POA: Insufficient documentation

## 2022-04-30 DIAGNOSIS — Z7902 Long term (current) use of antithrombotics/antiplatelets: Secondary | ICD-10-CM | POA: Diagnosis not present

## 2022-04-30 DIAGNOSIS — I129 Hypertensive chronic kidney disease with stage 1 through stage 4 chronic kidney disease, or unspecified chronic kidney disease: Secondary | ICD-10-CM | POA: Insufficient documentation

## 2022-04-30 DIAGNOSIS — K219 Gastro-esophageal reflux disease without esophagitis: Secondary | ICD-10-CM | POA: Diagnosis not present

## 2022-04-30 DIAGNOSIS — R338 Other retention of urine: Secondary | ICD-10-CM | POA: Diagnosis not present

## 2022-04-30 DIAGNOSIS — N310 Uninhibited neuropathic bladder, not elsewhere classified: Secondary | ICD-10-CM | POA: Insufficient documentation

## 2022-04-30 DIAGNOSIS — N189 Chronic kidney disease, unspecified: Secondary | ICD-10-CM | POA: Diagnosis not present

## 2022-04-30 DIAGNOSIS — I4891 Unspecified atrial fibrillation: Secondary | ICD-10-CM | POA: Diagnosis not present

## 2022-04-30 DIAGNOSIS — Z933 Colostomy status: Secondary | ICD-10-CM | POA: Insufficient documentation

## 2022-04-30 DIAGNOSIS — E1122 Type 2 diabetes mellitus with diabetic chronic kidney disease: Secondary | ICD-10-CM | POA: Diagnosis not present

## 2022-04-30 DIAGNOSIS — N319 Neuromuscular dysfunction of bladder, unspecified: Secondary | ICD-10-CM | POA: Diagnosis present

## 2022-04-30 DIAGNOSIS — I2510 Atherosclerotic heart disease of native coronary artery without angina pectoris: Secondary | ICD-10-CM | POA: Diagnosis not present

## 2022-04-30 DIAGNOSIS — I252 Old myocardial infarction: Secondary | ICD-10-CM | POA: Insufficient documentation

## 2022-04-30 DIAGNOSIS — E119 Type 2 diabetes mellitus without complications: Secondary | ICD-10-CM

## 2022-04-30 DIAGNOSIS — Z87891 Personal history of nicotine dependence: Secondary | ICD-10-CM | POA: Insufficient documentation

## 2022-04-30 DIAGNOSIS — I10 Essential (primary) hypertension: Secondary | ICD-10-CM | POA: Diagnosis not present

## 2022-04-30 DIAGNOSIS — Z01818 Encounter for other preprocedural examination: Secondary | ICD-10-CM

## 2022-04-30 HISTORY — PX: BOTOX INJECTION: SHX5754

## 2022-04-30 HISTORY — DX: Supraventricular tachycardia, unspecified: I47.10

## 2022-04-30 LAB — HEMOGLOBIN A1C
Hgb A1c MFr Bld: 6.3 % — ABNORMAL HIGH (ref 4.8–5.6)
Mean Plasma Glucose: 134.11 mg/dL

## 2022-04-30 LAB — CBC
HCT: 44.7 % (ref 39.0–52.0)
Hemoglobin: 14.2 g/dL (ref 13.0–17.0)
MCH: 28.1 pg (ref 26.0–34.0)
MCHC: 31.8 g/dL (ref 30.0–36.0)
MCV: 88.3 fL (ref 80.0–100.0)
Platelets: 195 10*3/uL (ref 150–400)
RBC: 5.06 MIL/uL (ref 4.22–5.81)
RDW: 16 % — ABNORMAL HIGH (ref 11.5–15.5)
WBC: 6.3 10*3/uL (ref 4.0–10.5)
nRBC: 0 % (ref 0.0–0.2)

## 2022-04-30 LAB — BASIC METABOLIC PANEL
Anion gap: 8 (ref 5–15)
BUN: 16 mg/dL (ref 6–20)
CO2: 23 mmol/L (ref 22–32)
Calcium: 8.2 mg/dL — ABNORMAL LOW (ref 8.9–10.3)
Chloride: 107 mmol/L (ref 98–111)
Creatinine, Ser: 0.95 mg/dL (ref 0.61–1.24)
GFR, Estimated: >60 mL/min (ref >60)
Glucose, Bld: 137 mg/dL — ABNORMAL HIGH (ref 70–99)
Potassium: 3.9 mmol/L (ref 3.5–5.1)
Sodium: 138 mmol/L (ref 135–145)

## 2022-04-30 LAB — GLUCOSE, CAPILLARY
Glucose-Capillary: 150 mg/dL — ABNORMAL HIGH (ref 70–99)
Glucose-Capillary: 134 mg/dL — ABNORMAL HIGH (ref 70–99)

## 2022-04-30 SURGERY — BOTOX INJECTION
Anesthesia: General

## 2022-04-30 MED ORDER — LIDOCAINE 2% (20 MG/ML) 5 ML SYRINGE
INTRAMUSCULAR | Status: DC | PRN
  Administered 2022-04-30: 100 mg via INTRAVENOUS

## 2022-04-30 MED ORDER — FENTANYL CITRATE (PF) 100 MCG/2ML IJ SOLN
INTRAMUSCULAR | Status: AC
  Filled 2022-04-30: qty 2

## 2022-04-30 MED ORDER — CEFAZOLIN SODIUM-DEXTROSE 2-4 GM/100ML-% IV SOLN
2.0000 g | INTRAVENOUS | Status: AC
  Administered 2022-04-30: 2 g via INTRAVENOUS
  Filled 2022-04-30: qty 100

## 2022-04-30 MED ORDER — LACTATED RINGERS IV SOLN: INTRAVENOUS | Status: DC

## 2022-04-30 MED ORDER — LIDOCAINE HCL (PF) 2 % IJ SOLN
INTRAMUSCULAR | Status: AC
  Filled 2022-04-30: qty 5

## 2022-04-30 MED ORDER — FENTANYL CITRATE (PF) 100 MCG/2ML IJ SOLN
INTRAMUSCULAR | Status: DC | PRN
  Administered 2022-04-30 (×2): 25 ug via INTRAVENOUS

## 2022-04-30 MED ORDER — STERILE WATER FOR INJECTION IJ SOLN
INTRAMUSCULAR | Status: AC
  Filled 2022-04-30: qty 10

## 2022-04-30 MED ORDER — ORAL CARE MOUTH RINSE: 15.0000 mL | Freq: Once | OROMUCOSAL | Status: AC

## 2022-04-30 MED ORDER — ONABOTULINUMTOXINA 100 UNITS IJ SOLR
INTRAMUSCULAR | Status: AC
  Filled 2022-04-30: qty 200

## 2022-04-30 MED ORDER — DEXAMETHASONE SODIUM PHOSPHATE 10 MG/ML IJ SOLN
INTRAMUSCULAR | Status: DC | PRN
  Administered 2022-04-30: 10 mg via INTRAVENOUS

## 2022-04-30 MED ORDER — PROPOFOL 10 MG/ML IV BOLUS
INTRAVENOUS | Status: DC | PRN
  Administered 2022-04-30: 130 mg via INTRAVENOUS

## 2022-04-30 MED ORDER — ONDANSETRON HCL 4 MG/2ML IJ SOLN
INTRAMUSCULAR | Status: DC | PRN
  Administered 2022-04-30: 4 mg via INTRAVENOUS

## 2022-04-30 MED ORDER — CHLORHEXIDINE GLUCONATE 0.12 % MT SOLN
15.0000 mL | Freq: Once | OROMUCOSAL | Status: AC
  Administered 2022-04-30: 15 mL via OROMUCOSAL

## 2022-04-30 MED ORDER — ONDANSETRON HCL 4 MG/2ML IJ SOLN
INTRAMUSCULAR | Status: AC
  Filled 2022-04-30: qty 2

## 2022-04-30 MED ORDER — PROPOFOL 10 MG/ML IV BOLUS
INTRAVENOUS | Status: AC
  Filled 2022-04-30: qty 20

## 2022-04-30 MED ORDER — ONABOTULINUMTOXINA 100 UNITS IJ SOLR
INTRAMUSCULAR | Status: DC | PRN
  Administered 2022-04-30: 200 [IU] via INTRAMUSCULAR

## 2022-04-30 MED ORDER — SODIUM CHLORIDE (PF) 0.9 % IJ SOLN
INTRAMUSCULAR | Status: DC | PRN
  Administered 2022-04-30: 20 mL

## 2022-04-30 MED ORDER — AMISULPRIDE (ANTIEMETIC) 5 MG/2ML IV SOLN: 10.0000 mg | Freq: Once | INTRAVENOUS | Status: DC | PRN

## 2022-04-30 MED ORDER — PHENYLEPHRINE 80 MCG/ML (10ML) SYRINGE FOR IV PUSH (FOR BLOOD PRESSURE SUPPORT)
PREFILLED_SYRINGE | INTRAVENOUS | Status: DC | PRN
  Administered 2022-04-30 (×2): 160 ug via INTRAVENOUS

## 2022-04-30 MED ORDER — STERILE WATER FOR IRRIGATION IR SOLN
Status: DC | PRN
  Administered 2022-04-30: 3000 mL

## 2022-04-30 MED ORDER — FENTANYL CITRATE PF 50 MCG/ML IJ SOSY: 25.0000 ug | PREFILLED_SYRINGE | INTRAMUSCULAR | Status: DC | PRN

## 2022-04-30 MED ORDER — PROMETHAZINE HCL 25 MG/ML IJ SOLN: 6.2500 mg | INTRAMUSCULAR | Status: DC | PRN

## 2022-04-30 MED ORDER — DEXAMETHASONE SODIUM PHOSPHATE 10 MG/ML IJ SOLN
INTRAMUSCULAR | Status: AC
  Filled 2022-04-30: qty 1

## 2022-04-30 MED ORDER — SODIUM CHLORIDE (PF) 0.9 % IJ SOLN
INTRAMUSCULAR | Status: AC
  Filled 2022-04-30: qty 20

## 2022-04-30 SURGICAL SUPPLY — 17 items
BAG URINE DRAIN 2000ML AR STRL (UROLOGICAL SUPPLIES) IMPLANT
BAG URO CATCHER STRL LF (MISCELLANEOUS) ×1 IMPLANT
CATH FOLEY 2WAY SLVR  5CC 16FR (CATHETERS) ×1
CATH FOLEY 2WAY SLVR 5CC 16FR (CATHETERS) IMPLANT
CLOTH BEACON ORANGE TIMEOUT ST (SAFETY) ×1 IMPLANT
GLOVE BIO SURGEON STRL SZ7.5 (GLOVE) ×1 IMPLANT
GOWN STRL REUS W/ TWL XL LVL3 (GOWN DISPOSABLE) ×2 IMPLANT
GOWN STRL REUS W/TWL XL LVL3 (GOWN DISPOSABLE) ×2
MANIFOLD NEPTUNE II (INSTRUMENTS) ×1 IMPLANT
NDL ASPIRATION 22 (NEEDLE) ×1 IMPLANT
NDL SAFETY ECLIP 18X1.5 (MISCELLANEOUS) IMPLANT
NEEDLE ASPIRATION 22 (NEEDLE) ×1 IMPLANT
PACK CYSTO (CUSTOM PROCEDURE TRAY) ×1 IMPLANT
PLUG CATH AND CAP STER (CATHETERS) IMPLANT
SYR CONTROL 10ML LL (SYRINGE) IMPLANT
TUBING CONNECTING 10 (TUBING) IMPLANT
WATER STERILE IRR 3000ML UROMA (IV SOLUTION) ×1 IMPLANT

## 2022-04-30 NOTE — Anesthesia Postprocedure Evaluation (Signed)
Anesthesia Post Note  Patient: Albert Jensen  Procedure(s) Performed: CYSTOSCOPY BOTOX INJECTION     Patient location during evaluation: PACU Anesthesia Type: General Level of consciousness: sedated Pain management: pain level controlled Vital Signs Assessment: post-procedure vital signs reviewed and stable Respiratory status: spontaneous breathing and respiratory function stable Cardiovascular status: stable Postop Assessment: no apparent nausea or vomiting Anesthetic complications: no  No notable events documented.  Last Vitals:  Vitals:   04/30/22 1430 04/30/22 1445  BP: 93/71 109/78  Pulse: 78 84  Resp: 14 18  Temp:  36.9 C  SpO2: 100% 96%    Last Pain:  Vitals:   04/30/22 1445  TempSrc:   PainSc: 0-No pain                 Tenlee Wollin DANIEL

## 2022-04-30 NOTE — Anesthesia Procedure Notes (Signed)
Procedure Name: LMA Insertion Date/Time: 04/30/2022 1:43 PM  Performed by: Sharlette Dense, CRNAPatient Re-evaluated:Patient Re-evaluated prior to induction Oxygen Delivery Method: Circle system utilized Preoxygenation: Pre-oxygenation with 100% oxygen Induction Type: IV induction LMA: LMA inserted LMA Size: 4.0 Number of attempts: 1 Placement Confirmation: positive ETCO2 and breath sounds checked- equal and bilateral Tube secured with: Tape Dental Injury: Teeth and Oropharynx as per pre-operative assessment

## 2022-04-30 NOTE — Discharge Instructions (Addendum)
You will have some blood in the urine.  This is okay as long as your catheter can drain.  Flush the catheter if it is not draining.  If that fails, exchange the catheter.  If that fails, proceed to the emergency department

## 2022-04-30 NOTE — Op Note (Signed)
Operative Note  Preoperative diagnosis:  1.  Neurogenic bladder with detrusor overactivity  Postoperative diagnosis: 1.  Same  Procedure(s): 1.  Cystoscopy with intra detrusor Botox injection, 200 units  Surgeon: Link Snuffer, MD  Assistants: None  Anesthesia: General  Complications: None immediate  EBL: Minimal  Specimens: 1.  None  Drains/Catheters: 1.  16 French suprapubic tube  Intraoperative findings: 1.  Normal anterior urethra 2.  Nonobstructing prostate with high riding bladder neck.  Bladder mucosa with some catheter edema but no obvious tumors or masses.  No obvious stones.  Indication: 52 year old male with neurogenic bladder presents for intra detrusor Botox  Description of procedure:  The patient was identified and consent was obtained.  The patient was taken to the operating room and placed in the supine position.  The patient was placed under general anesthesia.  Perioperative antibiotics were administered.  The patient was placed in dorsal lithotomy.  Patient was prepped and draped in a standard sterile fashion and a timeout was performed.  A 21 French rigid cystoscope was advanced into the urethra and into the bladder.  Complete cystoscopy was performed with findings noted above.  200 units of Botox was then systematically injected throughout the bladder into the detrusor.  There was no significant active bleeding.  I withdrew the scope.  I exchanged his suprapubic tube.  This include the operation.  Patient tolerated the procedure well was stable postoperatively  Plan: Repeat Botox 200 units in 6 months

## 2022-04-30 NOTE — Transfer of Care (Signed)
Immediate Anesthesia Transfer of Care Note  Patient: Albert Jensen  Procedure(s) Performed: CYSTOSCOPY BOTOX INJECTION  Patient Location: PACU  Anesthesia Type:General  Level of Consciousness: drowsy  Airway & Oxygen Therapy: Patient Spontanous Breathing and Patient connected to face mask oxygen  Post-op Assessment: Report given to RN and Post -op Vital signs reviewed and stable  Post vital signs: Reviewed and stable  Last Vitals:  Vitals Value Taken Time  BP    Temp    Pulse 79 04/30/22 1413  Resp 12 04/30/22 1413  SpO2 100 % 04/30/22 1413  Vitals shown include unvalidated device data.  Last Pain:  Vitals:   04/30/22 1136  TempSrc: Oral         Complications: No notable events documented.

## 2022-04-30 NOTE — Anesthesia Preprocedure Evaluation (Addendum)
Anesthesia Evaluation  Patient identified by MRN, date of birth, ID band Patient awake    Reviewed: Allergy & Precautions, NPO status , Patient's Chart, lab work & pertinent test results  History of Anesthesia Complications Negative for: history of anesthetic complications  Airway Mallampati: III  TM Distance: >3 FB Neck ROM: Full    Dental  (+) Poor Dentition, Dental Advisory Given   Pulmonary former smoker   Pulmonary exam normal        Cardiovascular hypertension, Pt. on medications + CAD, + Past MI and + Cardiac Stents  Normal cardiovascular exam+ dysrhythmias Atrial Fibrillation   TTE 2017 A complete portable two-dimensional transthoracic echocardiogram was  performed. The study was technically difficult. The left ventricle is  normal in size, wall thickness and wall motion with ejection fraction of  55-60%.  The tricupid valve leaflets are structurally normal with mild (1+)  regurgitation.  Mild aortic sclerosis is present with good valvular opening.     Neuro/Psych  PSYCHIATRIC DISORDERS  Depression   Dementia Non verbal CVA    GI/Hepatic Neg liver ROS,GERD  ,,Colostomy   Endo/Other  diabetes, Type 2    Renal/GU Renal disease     Musculoskeletal   Abdominal   Peds  Hematology negative hematology ROS (+)   Anesthesia Other Findings   Reproductive/Obstetrics                             Anesthesia Physical Anesthesia Plan  ASA: 4  Anesthesia Plan: General   Post-op Pain Management: Minimal or no pain anticipated   Induction: Intravenous  PONV Risk Score and Plan: 2 and Dexamethasone, Ondansetron and Treatment may vary due to age or medical condition  Airway Management Planned: LMA  Additional Equipment: None  Intra-op Plan:   Post-operative Plan: Extubation in OR  Informed Consent: I have reviewed the patients History and Physical, chart, labs and discussed the  procedure including the risks, benefits and alternatives for the proposed anesthesia with the patient or authorized representative who has indicated his/her understanding and acceptance.     Consent reviewed with POA and Interpreter used for interveiw  Plan Discussed with: Anesthesiologist and CRNA  Anesthesia Plan Comments: (Telephone conversation with brother, Richardson Landry)        Anesthesia Quick Evaluation

## 2022-04-30 NOTE — H&P (Signed)
CC/HPI: CC: Consideration of suprapubic tube  HPI: Patient is a 52 year old male that presents on a stretcher. Nobody in the room knows his medical history and he is nonverbal. With the limited records that he has, it reveals he has type 2 diabetes, history of anoxic brain damage, history of myocardial infarction, chronic kidney disease, cerebral infarction, urinary retention. The patient is able to give me a thumbs up and a thumbs down in response to questions. Patient is on Plavix. On exam he has a colostomy bag.   08/13/2017  Patient presents today for reevaluation of suprapubic tube. He does now wished to proceed. He is still nonverbal but able to give a thumbs up and it comes down. He enthusiastically gives a thumbs up to wanting a suprapubic tube and to the fact that the Foley catheter is bothersome.   11/05/2017:  Patient underwent a interventional radiology imaging guided suprapubic tube placement on 09/22/2017. He presents for upsizing. He appears to have no complaints however he is nonverbal.   06/22/2021  Patient continues bladder management with a suprapubic tube. He has been having leakage per urethra. He is able to respond to questions with thumbs up or thumbs down but is nonverbal.   09/22/2021: 52 year old man with neurogenic bladder who presents today for follow-up. He is currently managed with a suprapubic tube. He was started on Myrbetriq and has been taking this daily. According to the facility, he continues to have urethral leakage. Options were discussed with the patient and per last office visit note bladder Botox would be beneficial. This will need to be completed in a hospital setting.   04/12/2022  Patient underwent Botox 200 units in the operating room back in July. Patient is by himself and is nonverbal but gives a thumbs up and thumbs down to questions. Gives a thumbs up to staying dry since the Botox and being happy with that. Wants to proceed with repeat.     ALLERGIES: No  Allergies    MEDICATIONS: Lipitor 80 mg tablet  Metformin Hcl 500 mg tablet  Plavix 75 mg tablet  Terazosin Hcl 1 mg capsule  Vesicare 5 mg tablet 1 tablet PO Daily  Amlodipine Besilate  Aspir 81  Calmoseptine  Cetaphil  Claritin  Docusate Sodium  Melatonin  Multivitamin  Nizoral 2 % shampoo  Norco 5 mg-325 mg tablet tablet  Pepcid  Remeron  Requip  Senna  Sertraline Hcl 50 mg tablet  Simethicone 80 mg tablet,chewable  Tylenol 325 mg capsule  Vasotec 2.5 mg tablet  Vitamin C  Vitamin D3  Zofran  Zoloft 50 mg tablet     GU PSH: Compl Change SP Tube - 2019 Cystourethroscopy, W/Injection For Chemodenervation Of Bladder - 10/30/2021 Simple Change SP Tube - 10/30/2021, 2019     NON-GU PSH: No Non-GU PSH    GU PMH: Incontinence w/o Sensation - 09/22/2021, - 06/22/2021 Urinary Retention - 09/22/2021, - 06/22/2021, - 2019    NON-GU PMH: Atrial Fibrillation Depression Diabetes Type 2 Hypertension Myocardial Infarction Stroke/TIA    FAMILY HISTORY: No Family History    SOCIAL HISTORY: Marital Status: Single    REVIEW OF SYSTEMS:    GU Review Male:   Patient denies frequent urination, hard to postpone urination, burning/ pain with urination, get up at night to urinate, leakage of urine, stream starts and stops, trouble starting your stream, have to strain to urinate , erection problems, and penile pain.  Gastrointestinal (Upper):   Patient denies nausea, vomiting, and indigestion/ heartburn.  Gastrointestinal (  Lower):   Patient denies diarrhea and constipation.  Constitutional:   Patient denies fever, night sweats, weight loss, and fatigue.  Skin:   Patient denies skin rash/ lesion and itching.  Eyes:   Patient denies blurred vision and double vision.  Ears/ Nose/ Throat:   Patient denies sore throat and sinus problems.  Hematologic/Lymphatic:   Patient denies swollen glands and easy bruising.  Cardiovascular:   Patient denies leg swelling and chest pains.   Respiratory:   Patient denies cough and shortness of breath.  Endocrine:   Patient denies excessive thirst.  Musculoskeletal:   Patient denies back pain and joint pain.  Neurological:   Patient denies headaches and dizziness.  Psychologic:   Patient denies depression and anxiety.   VITAL SIGNS: None   MULTI-SYSTEM PHYSICAL EXAMINATION:    Constitutional: Elderly male on the stretcher  Gastrointestinal: No mass, no tenderness, no rigidity, non obese abdomen. Suprapubic tube draining clear yellow urine     Complexity of Data:  Source Of History:  Patient  Records Review:   Previous Doctor Records, Previous Patient Records   PROCEDURES: None   ASSESSMENT:      ICD-10 Details  1 GU:   Incontinence w/o Sensation - N39.42 Chronic, Stable  2   Urinary Retention - R33.8 Chronic, Stable  3   Unihibited neuropathic bladder - N31.0 Chronic, Stable   PLAN:           Document Letter(s):  Created for Patient: Clinical Summary         Notes:   Proceed with repeat Botox, 200 units.   CC: Dr. Mal Amabile    Signed by Link Snuffer, III, M.D. on 04/12/22 at 10:08 AM (EST)

## 2022-05-01 ENCOUNTER — Encounter (HOSPITAL_COMMUNITY): Payer: Self-pay | Admitting: Urology

## 2022-07-19 ENCOUNTER — Encounter: Payer: Self-pay | Admitting: Internal Medicine

## 2022-07-19 ENCOUNTER — Ambulatory Visit (INDEPENDENT_AMBULATORY_CARE_PROVIDER_SITE_OTHER): Payer: Medicare Other | Admitting: Internal Medicine

## 2022-07-19 VITALS — BP 118/68 | HR 112 | Temp 97.8°F | Ht 74.0 in | Wt 213.0 lb

## 2022-07-19 DIAGNOSIS — Z1211 Encounter for screening for malignant neoplasm of colon: Secondary | ICD-10-CM | POA: Diagnosis not present

## 2022-07-19 DIAGNOSIS — K2100 Gastro-esophageal reflux disease with esophagitis, without bleeding: Secondary | ICD-10-CM

## 2022-07-19 DIAGNOSIS — K5904 Chronic idiopathic constipation: Secondary | ICD-10-CM

## 2022-07-19 NOTE — Progress Notes (Signed)
Primary Care Physician:  Bernerd LimboAriza, Fernando Enrique, MD Primary Gastroenterologist:  Dr. Marletta Lorarver  No chief complaint on file.   HPI:   Albert Jensen is a 52 y.o. male who presents to the clinic today for fu visit. Patient is nonverbal.  Answers yes and no questions with his hands.  52 year old male with history cardiac arrest in October 2016 with protracted downtime of about 15 minutes, subsequent drug-eluting stent to the RCA but apparently suffered a stroke or anoxic brain injury.  Also history of diabetes, hypertension, chronic CNS deficit, depression, chronic renal insufficiency.  Has a history of diverting colostomy due to sacral decubital ulcer and subsequent osteomyelitis.  He was in the hospital for nearly 150 days for treatment of this.  Initially seen September 2023 for nausea and vomiting daily.  Has a basin with him in case he becomes sick.  Happens on a daily basis.  Takes Zofran without much improvement.  Not related to meals.  Actually his appetite is "very good."  Patient does tend to eat too fast and nurses try to get him to slow down.  Patient agrees that he eats too fast.  Does have diabetes though patient reportedly refuses medications for this.  EGD 01/2022: - LA Grade C reflux esophagitis with no bleeding. Biopsied. Acute erosive esophagitis. - Z-line irregular, 40 cm from the incisors. - Gastritis. Biopsied. Reactive gastropathy with foveolar hyperplasia and minimal chronic gastritis. - Normal duodenal bulb and second portion of the duodenum  Started on PPI twice daily as well as Reglan before meals for suspected diabetic gastroparesis.  On follow-up 03/2022 reported his nausea vomiting had resolved.  Abdominal pain resolved.  Cologuard testing ordered on previous visit for colon cancer screening.  I do not see where this has resulted.  Today, states he is doing great. No GI complaints for me today.   Past Medical History:  Diagnosis Date   Anemia    Anoxic  encephalopathy (HCC)    Aphasia following cerebral infarction    BPH with obstruction/lower urinary tract symptoms    CAD (coronary artery disease)    DES proximal RCA and aspiration thrombectomy mid RCA and PLB 01/2015   Cardiac arrest [facility](HCC)    October 2016 with STEMI   Cataract, bilateral    Chronic kidney disease    Cognitive communication deficit    Colostomy in place (HCC)    Dementia (HCC)    Depression    Diabetic neuropathy (HCC)    Essential hypertension    GERD (gastroesophageal reflux disease)    Hemiplegia (HCC)    History of blood transfusion    History of infection due to ESBL Escherichia coli    History of tracheostomy    Neuromuscular dysfunction of bladder    Osteomyelitis (HCC)    Paroxysmal A-fib (HCC)    Peripheral vascular disease (HCC)    Respiratory failure (HCC)    Prolonged ventilatory course, complicated by mucus plugging and ultimately required tracheostomy 10-02/2015   Restless leg syndrome    RVOT-VT (right ventricular outflow tract ventricular tachycardia) (HCC)    Evaluated by Dr. Ladona Ridgelaylor in 2012   Seborrheic dermatitis    ST elevation myocardial infarction (STEMI) of inferolateral wall [facility](HCC)    October 2016    Stroke [facility](HCC)    Suprapubic catheter [facility](HCC)    Supraventricular tachycardia    Type 2 diabetes mellitus (HCC)    Ventilator associated pneumonia (HCC)     Past Surgical History:  Procedure Laterality Date  BIOPSY  01/15/2022   Procedure: BIOPSY;  Surgeon: Lanelle Bal, DO;  Location: [facility];  Service: Endoscopy;;   BOTOX INJECTION N/A 10/30/2021   Procedure: CYSTOSCOPY BOTOX INJECTION, SUPRAPUBIC TUBE EXCHANGE;  Surgeon: Crista Elliot, MD;  Location: [facility] ORS;  Service: Urology;  Laterality: N/A;  45 MINS   BOTOX INJECTION N/A 04/30/2022   Procedure: CYSTOSCOPY BOTOX INJECTION;  Surgeon: Crista Elliot, MD;  Location: [facility] ORS;  Service: Urology;  Laterality: N/A;  33 MINS FOR CASE   CARDIAC CATHETERIZATION N/A  01/22/2015   Procedure: Left Heart Cath and Coronary Angiography;  Surgeon: Corky Crafts, MD;  Location: [facility] INVASIVE CV LAB;  Service: Cardiovascular;  Laterality: N/A;   CARDIAC CATHETERIZATION N/A 01/22/2015   Procedure: Coronary Stent Intervention;  Surgeon: Corky Crafts, MD;  Location: [facility] INVASIVE CV LAB;  Service: Cardiovascular;  Laterality: N/A;  prox rca   ESOPHAGOGASTRODUODENOSCOPY (EGD) WITH PROPOFOL N/A 01/15/2022   Procedure: ESOPHAGOGASTRODUODENOSCOPY (EGD) WITH PROPOFOL;  Surgeon: Lanelle Bal, DO;  Location: [facility];  Service: Endoscopy;  Laterality: N/A;  9:30 am    Current Outpatient Medications  Medication Sig Dispense Refill   amLODipine (NORVASC) 10 MG tablet Take 10 mg by mouth in the morning. (0800)     Emollient (CETAPHIL) cream Apply 1 Application topically every 12 (twelve) hours as needed (hydration of skin (groin, buttocks & perineum)).     Menthol-Zinc Oxide (CALMOSEPTINE) 0.44-20.6 % OINT Apply 1 Application topically every 8 (eight) hours as needed (protection (groin/buttocks/perineum)).     metoCLOPramide (REGLAN) 5 MG tablet Take 1 tablet (5 mg total) by mouth 3 (three) times daily before meals. 90 tablet 5   mirabegron ER (MYRBETRIQ) 50 MG TB24 tablet Take 50 mg by mouth in the morning. (0800)     ondansetron (ZOFRAN) 4 MG tablet Take 4 mg by mouth every 4 (four) hours as needed for nausea/vomiting.     pantoprazole (PROTONIX) 40 MG tablet Take 1 tablet (40 mg total) by mouth 2 (two) times daily. 60 tablet 11   sennosides-docusate sodium (SENOKOT-S) 8.6-50 MG tablet Take 2 tablets by mouth daily. (0800)     solifenacin (VESICARE) 5 MG tablet Take 5 mg by mouth in the morning. (0800)     No current facility-administered medications for this visit.    Allergies as of 07/19/2022   (No Known Allergies)    Family History  Problem Relation Age of Onset   Heart disease Mother 17       MI   Diabetes Father     Social History    Socioeconomic History   Marital status: Single    Spouse name: Not on file   Number of children: Not on file   Years of education: Not on file   Highest education level: Not on file  Occupational History   Not on file  Tobacco Use   Smoking status: Former    Types: Cigarettes    Quit date: 05/01/2005    Years since quitting: 17.2   Smokeless tobacco: Never  Substance and Sexual Activity   Alcohol use: Not Currently    Alcohol/week: 1.0 standard drink of alcohol    Types: 1 Cans of beer per week   Drug use: Not Currently   Sexual activity: Not Currently  Other Topics Concern   Not on file  Social History Narrative   Not on file   Social Determinants of Health   Financial Resource Strain: Not on file  Food Insecurity: Not on file  Transportation Needs: Not on file  Physical Activity: Not on file  Stress: Not on file  Social Connections: Not on file  Intimate Partner Violence: Not on file    Subjective: Review of Systems  Constitutional:  Negative for chills and fever.  HENT:  Negative for congestion and hearing loss.   Eyes:  Negative for blurred vision and double vision.  Respiratory:  Negative for cough and shortness of breath.   Cardiovascular:  Negative for chest pain and palpitations.  Gastrointestinal:  Negative for abdominal pain, blood in stool, constipation, diarrhea, heartburn, melena, nausea and vomiting.  Genitourinary:  Negative for dysuria and urgency.  Musculoskeletal:  Negative for joint pain and myalgias.  Skin:  Negative for itching and rash.  Neurological:  Negative for dizziness and headaches.  Psychiatric/Behavioral:  Negative for depression. The patient is not nervous/anxious.        Objective: There were no vitals taken for this visit. Physical Exam Constitutional:      Appearance: Normal appearance.     Comments: Wheelchair bound  HENT:     Head: Normocephalic and atraumatic.  Eyes:     Extraocular Movements: Extraocular movements  intact.     Conjunctiva/sclera: Conjunctivae normal.  Cardiovascular:     Rate and Rhythm: Normal rate and regular rhythm.  Pulmonary:     Effort: Pulmonary effort is normal.     Breath sounds: Normal breath sounds.  Abdominal:     General: Bowel sounds are normal.     Palpations: Abdomen is soft.  Musculoskeletal:        General: Normal range of motion.     Cervical back: Normal range of motion and neck supple.  Skin:    General: Skin is warm.  Neurological:     General: No focal deficit present.     Mental Status: He is alert and oriented to person, place, and time.  Psychiatric:        Mood and Affect: Mood normal.        Behavior: Behavior normal.     Comments: nonverbal      Assessment: *Nausea and vomiting-resolved *Abdominal pain-improved *Constipation-well-controlled *Colon cancer screening *LA grade C esophagitis-on PPI therapy *Diabetic gastroparesis  Plan: Patient much improved today in office.  Continue on Reglan for diabetic gastroparesis.  Counseled on risk of tardive dyskinesia and he understands. Recommend tight glycemic control.   Decrease PPI to once daily. If has resurgence of symptoms, okay to increase back up to twice daily.   Cologuard test reordered today for colon cancer screening purposes.  Continue current regimen for constipation.  Follow-up in 6 months.   07/19/2022 10:53 AM   Disclaimer: This note was dictated with voice recognition software. Similar sounding words can inadvertently be transcribed and may not be corrected upon review.

## 2022-12-17 ENCOUNTER — Emergency Department (HOSPITAL_COMMUNITY)
Admission: EM | Admit: 2022-12-17 | Discharge: 2022-12-18 | Disposition: A | Discharge status: Home / Self Care | Payer: Medicare Other | Attending: Emergency Medicine | Admitting: Emergency Medicine

## 2022-12-17 ENCOUNTER — Other Ambulatory Visit: Payer: Self-pay

## 2022-12-17 ENCOUNTER — Encounter (HOSPITAL_COMMUNITY): Payer: Self-pay | Admitting: Emergency Medicine

## 2022-12-17 ENCOUNTER — Emergency Department (HOSPITAL_COMMUNITY): Payer: Medicare Other

## 2022-12-17 DIAGNOSIS — L309 Dermatitis, unspecified: Secondary | ICD-10-CM | POA: Insufficient documentation

## 2022-12-17 DIAGNOSIS — R109 Unspecified abdominal pain: Secondary | ICD-10-CM | POA: Insufficient documentation

## 2022-12-17 DIAGNOSIS — R21 Rash and other nonspecific skin eruption: Secondary | ICD-10-CM | POA: Diagnosis present

## 2022-12-17 DIAGNOSIS — R1084 Generalized abdominal pain: Secondary | ICD-10-CM

## 2022-12-17 LAB — CBC
HCT: 48.9 % (ref 39.0–52.0)
Hemoglobin: 15.8 g/dL (ref 13.0–17.0)
MCH: 29 pg (ref 26.0–34.0)
MCHC: 32.3 g/dL (ref 30.0–36.0)
MCV: 89.9 fL (ref 80.0–100.0)
Platelets: 256 10*3/uL (ref 150–400)
RBC: 5.44 MIL/uL (ref 4.22–5.81)
RDW: 14.1 % (ref 11.5–15.5)
WBC: 8 10*3/uL (ref 4.0–10.5)
nRBC: 0 % (ref 0.0–0.2)

## 2022-12-17 LAB — COMPREHENSIVE METABOLIC PANEL
ALT: 16 U/L (ref 0–44)
AST: 12 U/L — ABNORMAL LOW (ref 15–41)
Albumin: 3.4 g/dL — ABNORMAL LOW (ref 3.5–5.0)
Alkaline Phosphatase: 68 U/L (ref 38–126)
Anion gap: 7 (ref 5–15)
BUN: 21 mg/dL — ABNORMAL HIGH (ref 6–20)
CO2: 24 mmol/L (ref 22–32)
Calcium: 8.5 mg/dL — ABNORMAL LOW (ref 8.9–10.3)
Chloride: 104 mmol/L (ref 98–111)
Creatinine, Ser: 1.09 mg/dL (ref 0.61–1.24)
GFR, Estimated: >60 mL/min (ref >60)
Glucose, Bld: 177 mg/dL — ABNORMAL HIGH (ref 70–99)
Potassium: 3.4 mmol/L — ABNORMAL LOW (ref 3.5–5.1)
Sodium: 135 mmol/L (ref 135–145)
Total Bilirubin: 0.7 mg/dL (ref 0.3–1.2)
Total Protein: 7.5 g/dL (ref 6.5–8.1)

## 2022-12-17 LAB — LIPASE, BLOOD: Lipase: 55 U/L — ABNORMAL HIGH (ref 11–51)

## 2022-12-17 MED ORDER — HYDROCORTISONE 1 % EX CREA: TOPICAL_CREAM | CUTANEOUS | 0 refills | Status: DC

## 2022-12-17 MED ORDER — IOHEXOL 300 MG/ML  SOLN
100.0000 mL | Freq: Once | INTRAMUSCULAR | Status: AC | PRN
  Administered 2022-12-17: 100 mL via INTRAVENOUS

## 2022-12-17 NOTE — ED Triage Notes (Addendum)
Pt to ed via [facility] from [facility]. Distended abdomen that is warm and red that started today. Earlier had 3x emesis, given zofran at facility. No emesis since left facility. Colostomy bag that was changed 24-48 hrs prior. Pt has a chronic supra pubic foley. Pt is non-verbal; thumbs up is yes, thumbs down is no. Pt is normal at pt's baseline.   Hx of afib

## 2022-12-17 NOTE — ED Provider Notes (Signed)
[place] EMERGENCY DEPARTMENT AT [facility] Provider Note   CSN: [id number] Arrival date & time: 12/17/22  7846     History  Chief Complaint  Patient presents with   Abdominal Pain    Travon Guandique is a 52 y.o. male.  This is a 52 year old male who presents to the emergency department today due to abdominal distention, and a rash on the abdomen.  Patient is nonverbal, communicates with thumbs up and thumbs down.  Patient indicates that he is not currently experiencing any pain.   Abdominal Pain      Home Medications Prior to Admission medications   Medication Sig Start Date End Date Taking? Authorizing Provider  hydrocortisone cream 1 % Apply to affected area 2 times daily 12/17/22  Yes Anders Simmonds T, DO  amLODipine (NORVASC) 10 MG tablet Take 10 mg by mouth in the morning. (0800)    [provider]  Emollient (CETAPHIL) cream Apply 1 Application topically every 12 (twelve) hours as needed (hydration of skin (groin, buttocks & perineum)).    [provider]  Menthol-Zinc Oxide (CALMOSEPTINE) 0.44-20.6 % OINT Apply 1 Application topically every 8 (eight) hours as needed (protection (groin/buttocks/perineum)).    [provider]  metoCLOPramide (REGLAN) 5 MG tablet Take 1 tablet (5 mg total) by mouth 3 (three) times daily before meals. 12/14/21 06/12/22  Lanelle Bal, DO  mirabegron ER (MYRBETRIQ) 50 MG TB24 tablet Take 50 mg by mouth in the morning. (0800)    [provider]  ondansetron (ZOFRAN) 4 MG tablet Take 4 mg by mouth every 4 (four) hours as needed for nausea/vomiting. 09/14/21   [provider]  pantoprazole (PROTONIX) 40 MG tablet Take 1 tablet (40 mg total) by mouth 2 (two) times daily. 01/15/22 01/15/23  Lanelle Bal, DO  sennosides-docusate sodium (SENOKOT-S) 8.6-50 MG tablet Take 2 tablets by mouth daily. (0800)    [provider]  solifenacin (VESICARE) 5 MG tablet Take 5 mg by mouth in the  morning. (0800)    [provider]      Allergies    Patient has no known allergies.    Review of Systems   Review of Systems  Gastrointestinal:  Positive for abdominal pain.    Physical Exam Updated Vital Signs BP 128/79   Pulse 96   Resp 16   Ht 6\' 2"  (1.88 m)   Wt 97 kg   SpO2 93%   BMI 27.46 kg/m  Physical Exam Vitals reviewed.  Pulmonary:     Effort: Pulmonary effort is normal.  Abdominal:     General: There is distension.     Palpations: Abdomen is soft. There is no fluid wave or pulsatile mass.  Skin:    Comments: Dry skin over the abdomen, does not appear to be cellulitis  Neurological:     Mental Status: He is alert.     ED Results / Procedures / Treatments   Labs (all labs ordered are listed, but only abnormal results are displayed) Labs Reviewed  LIPASE, BLOOD - Abnormal; Notable for the following components:      Result Value   Lipase 55 (*)    All other components within normal limits  COMPREHENSIVE METABOLIC PANEL - Abnormal; Notable for the following components:   Potassium 3.4 (*)    Glucose, Bld 177 (*)    BUN 21 (*)    Calcium 8.5 (*)    Albumin 3.4 (*)    AST 12 (*)  All other components within normal limits  CBC    EKG None  Radiology No results found.  Procedures Procedures    Medications Ordered in ED Medications  iohexol (OMNIPAQUE) 300 MG/ML solution 100 mL (100 mLs Intravenous Contrast Given 12/17/22 2237)    ED Course/ Medical Decision Making/ A&P                                 Medical Decision Making This is a 52 year old male is here today from skilled nursing facility due to abdominal distention and dry skin over the abdomen.  Differential diagnoses include obstruction, less likely cellulitis, dermatitis.  Plan-patient's abdomen is soft, is distended.  Unclear if this is his baseline.  Patient does not appear to be in any distress.  Patient's heart rate is in the 90s, normotensive.  Will obtain imaging  of the patient's abdomen pelvis, CBC and CMP ordered.  Patient has an indwelling Foley catheter, however but has no tenderness or discharge around the area.  Patient will be signed out to evening team pending CT imaging of the abdomen and pelvis.  Amount and/or Complexity of Data Reviewed Labs: ordered. Radiology: ordered.  Risk Prescription drug management.           Final Clinical Impression(s) / ED Diagnoses Final diagnoses:  Dermatitis    Rx / DC Orders ED Discharge Orders          Ordered    hydrocortisone cream 1 %        12/17/22 2330              Anders Simmonds T, DO 12/17/22 2330

## 2022-12-18 DIAGNOSIS — L309 Dermatitis, unspecified: Secondary | ICD-10-CM | POA: Diagnosis not present

## 2022-12-18 NOTE — ED Provider Notes (Signed)
Care of the patient assumed at shift change. He is non-verbal from prior stroke but communicates with thumbs up/down. Sent from SNF for abdominal distension, awaiting CT. I personally viewed the images from radiology studies and agree with radiologist interpretation: CT is neg for acute obstructive process. He is sleeping on re-assessment, abdomen is benign. He indicates he is feeling well and would like to go home.    Pollyann Savoy, MD 12/18/22 [phone]

## 2022-12-18 NOTE — ED Notes (Signed)
Attempted to call report to [facility] nursing facility. No answer at this time.

## 2022-12-18 NOTE — ED Notes (Signed)
Pt's brother called. Update given.

## 2023-01-17 ENCOUNTER — Ambulatory Visit: Payer: Medicare Other | Admitting: Internal Medicine

## 2023-01-17 ENCOUNTER — Encounter: Payer: Self-pay | Admitting: Internal Medicine

## 2023-01-17 ENCOUNTER — Telehealth: Payer: Self-pay | Admitting: *Deleted

## 2023-01-17 VITALS — BP 145/68 | HR 106 | Temp 97.8°F | Ht 74.0 in | Wt 217.0 lb

## 2023-01-17 DIAGNOSIS — R1032 Left lower quadrant pain: Secondary | ICD-10-CM

## 2023-01-17 DIAGNOSIS — K5900 Constipation, unspecified: Secondary | ICD-10-CM | POA: Diagnosis not present

## 2023-01-17 DIAGNOSIS — K5904 Chronic idiopathic constipation: Secondary | ICD-10-CM

## 2023-01-17 DIAGNOSIS — R112 Nausea with vomiting, unspecified: Secondary | ICD-10-CM

## 2023-01-17 DIAGNOSIS — R109 Unspecified abdominal pain: Secondary | ICD-10-CM

## 2023-01-17 DIAGNOSIS — K209 Esophagitis, unspecified without bleeding: Secondary | ICD-10-CM

## 2023-01-17 DIAGNOSIS — Z1211 Encounter for screening for malignant neoplasm of colon: Secondary | ICD-10-CM

## 2023-01-17 DIAGNOSIS — K2100 Gastro-esophageal reflux disease with esophagitis, without bleeding: Secondary | ICD-10-CM

## 2023-01-17 DIAGNOSIS — E1143 Type 2 diabetes mellitus with diabetic autonomic (poly)neuropathy: Secondary | ICD-10-CM

## 2023-01-17 NOTE — Patient Instructions (Signed)
I am happy to hear that you are doing so well.  Continue on pantoprazole and metoclopramide.  We will reach out to your facility in regards to ordering Cologuard testing as there seems to be an issue with getting this done.  If positive you will need colonoscopy.  We will call with results.  Otherwise follow-up in 6 months.  It was very nice seeing you again today.  Dr. Marletta Lor

## 2023-01-17 NOTE — Telephone Encounter (Signed)
Spoke with Visual merchandiser on pt hall) at [facility]. She reports they never received kit for patient to have cologuard done. If it is shipped to facility in patient name, it would go to her. Advised will place new order for cologuard and Dr. Marletta Lor is aware. Fowarding to Dena also to follow up on.

## 2023-01-17 NOTE — Progress Notes (Signed)
Primary Care Physician:  Bernerd Limbo, MD Primary Gastroenterologist:  Dr. Marletta Lor  Chief Complaint  Patient presents with   Follow-up    Following up on GERD    HPI:   Albert Jensen is a 52 y.o. male who presents to the clinic today for fu visit. Patient is nonverbal.  Answers yes and no questions with his hands.  52 year old male with history cardiac arrest in October 2016 with protracted downtime of about 15 minutes, subsequent drug-eluting stent to the RCA but apparently suffered a stroke or anoxic brain injury.  Also history of diabetes, hypertension, chronic CNS deficit, depression, chronic renal insufficiency.  Has a history of diverting colostomy due to sacral decubital ulcer and subsequent osteomyelitis.  He was in the hospital for nearly 150 days for treatment of this.  Initially seen September 2023 for nausea and vomiting daily.  Has a basin with him in case he becomes sick.  Happens on a daily basis.  Takes Zofran without much improvement.  Not related to meals.  Actually his appetite is "very good."  Patient does tend to eat too fast and nurses try to get him to slow down.  Patient agrees that he eats too fast.  Does have diabetes though patient reportedly refuses medications for this.  EGD 01/2022: - LA Grade C reflux esophagitis with no bleeding. Biopsied. Acute erosive esophagitis. - Z-line irregular, 40 cm from the incisors. - Gastritis. Biopsied. Reactive gastropathy with foveolar hyperplasia and minimal chronic gastritis. - Normal duodenal bulb and second portion of the duodenum  Started on PPI twice daily as well as Reglan before meals for suspected diabetic gastroparesis.  On follow-up 03/2022 reported his nausea vomiting had resolved.  Abdominal pain resolved.  Cologuard testing ordered on previous visit for colon cancer screening.  I do not see where this has resulted.  Today, states he is doing great. No GI complaints for me today.   Past Medical  History:  Diagnosis Date   Anemia    Anoxic encephalopathy (HCC)    Aphasia following cerebral infarction    BPH with obstruction/lower urinary tract symptoms    CAD (coronary artery disease)    DES proximal RCA and aspiration thrombectomy mid RCA and PLB 01/2015   Cardiac arrest [facility])    October 2016 with STEMI   Cataract, bilateral    Chronic kidney disease    Cognitive communication deficit    Colostomy in place (HCC)    Dementia (HCC)    Depression    Diabetic neuropathy (HCC)    Essential hypertension    GERD (gastroesophageal reflux disease)    Hemiplegia (HCC)    History of blood transfusion    History of infection due to ESBL Escherichia coli    History of tracheostomy    Neuromuscular dysfunction of bladder    Osteomyelitis (HCC)    Paroxysmal A-fib (HCC)    Peripheral vascular disease (HCC)    Respiratory failure (HCC)    Prolonged ventilatory course, complicated by mucus plugging and ultimately required tracheostomy 10-02/2015   Restless leg syndrome    RVOT-VT (right ventricular outflow tract ventricular tachycardia) (HCC)    Evaluated by Dr. Ladona Ridgel in 2012   Seborrheic dermatitis    ST elevation myocardial infarction (STEMI) of inferolateral wall [facility])    October 2016    Stroke [facility])    Suprapubic catheter (HCC)    Supraventricular tachycardia (HCC)    Type 2 diabetes mellitus (HCC)    Ventilator associated pneumonia (HCC)  Past Surgical History:  Procedure Laterality Date   BIOPSY  01/15/2022   Procedure: BIOPSY;  Surgeon: Lanelle Bal, DO;  [facility];  Service: Endoscopy;;   BOTOX INJECTION N/A 10/30/2021   Procedure: CYSTOSCOPY BOTOX INJECTION, SUPRAPUBIC TUBE EXCHANGE;  Surgeon: Crista Elliot, MD;  Location: [facility] ORS;  Service: Urology;  Laterality: N/A;  45 MINS   BOTOX INJECTION N/A 04/30/2022   Procedure: CYSTOSCOPY BOTOX INJECTION;  Surgeon: Crista Elliot, MD;  Location: [facility];  Service: Urology;  Laterality: N/A;  63  MINS FOR CASE   CARDIAC CATHETERIZATION N/A 01/22/2015   Procedure: Left Heart Cath and Coronary Angiography;  Surgeon: Corky Crafts, MD;  Location: [facility] INVASIVE CV LAB;  Service: Cardiovascular;  Laterality: N/A;   CARDIAC CATHETERIZATION N/A 01/22/2015   Procedure: Coronary Stent Intervention;  Surgeon: Corky Crafts, MD;  Location: [facility] INVASIVE CV LAB;  Service: Cardiovascular;  Laterality: N/A;  prox rca   ESOPHAGOGASTRODUODENOSCOPY (EGD) WITH PROPOFOL N/A 01/15/2022   Procedure: ESOPHAGOGASTRODUODENOSCOPY (EGD) WITH PROPOFOL;  Surgeon: Lanelle Bal, DO;  [facility];  Service: Endoscopy;  Laterality: N/A;  9:30 am    Current Outpatient Medications  Medication Sig Dispense Refill   amLODipine (NORVASC) 10 MG tablet Take 10 mg by mouth in the morning. (0800)     Emollient (CETAPHIL) cream Apply 1 Application topically every 12 (twelve) hours as needed (hydration of skin (groin, buttocks & perineum)).     hydrocortisone cream 1 % Apply to affected area 2 times daily 15 g 0   Menthol-Zinc Oxide (CALMOSEPTINE) 0.44-20.6 % OINT Apply 1 Application topically every 8 (eight) hours as needed (protection (groin/buttocks/perineum)).     metoCLOPramide (REGLAN) 5 MG tablet Take 1 tablet (5 mg total) by mouth 3 (three) times daily before meals. 90 tablet 5   mirabegron ER (MYRBETRIQ) 50 MG TB24 tablet Take 50 mg by mouth in the morning. (0800)     ondansetron (ZOFRAN) 4 MG tablet Take 4 mg by mouth every 4 (four) hours as needed for nausea/vomiting.     pantoprazole (PROTONIX) 40 MG tablet Take 1 tablet (40 mg total) by mouth 2 (two) times daily. 60 tablet 11   sennosides-docusate sodium (SENOKOT-S) 8.6-50 MG tablet Take 2 tablets by mouth daily. (0800)     solifenacin (VESICARE) 5 MG tablet Take 5 mg by mouth in the morning. (0800)     No current facility-administered medications for this visit.    Allergies as of 01/17/2023   (No Known Allergies)    Family History   Problem Relation Age of Onset   Heart disease Mother 63       MI   Diabetes Father     Social History   Socioeconomic History   Marital status: Single    Spouse name: Not on file   Number of children: Not on file   Years of education: Not on file   Highest education level: Not on file  Occupational History   Not on file  Tobacco Use   Smoking status: Former    Current packs/day: 0.00    Types: Cigarettes    Quit date: 05/01/2005    Years since quitting: 17.7   Smokeless tobacco: Never  Substance and Sexual Activity   Alcohol use: Not Currently    Alcohol/week: 1.0 standard drink of alcohol    Types: 1 Cans of beer per week   Drug use: Not Currently   Sexual activity: Not Currently  Other Topics Concern   Not on file  Social History Narrative   Not on file   Social Determinants of Health   Financial Resource Strain: Not on file  Food Insecurity: Not on file  Transportation Needs: Not on file  Physical Activity: Not on file  Stress: Not on file  Social Connections: Not on file  Intimate Partner Violence: Not on file    Subjective: Review of Systems  Constitutional:  Negative for chills and fever.  HENT:  Negative for congestion and hearing loss.   Eyes:  Negative for blurred vision and double vision.  Respiratory:  Negative for cough and shortness of breath.   Cardiovascular:  Negative for chest pain and palpitations.  Gastrointestinal:  Negative for abdominal pain, blood in stool, constipation, diarrhea, heartburn, melena, nausea and vomiting.  Genitourinary:  Negative for dysuria and urgency.  Musculoskeletal:  Negative for joint pain and myalgias.  Skin:  Negative for itching and rash.  Neurological:  Negative for dizziness and headaches.  Psychiatric/Behavioral:  Negative for depression. The patient is not nervous/anxious.        Objective: BP (!) 146/74   Pulse (!) 106   Temp 97.8 F (36.6 C)   Ht 6\' 2"  (1.88 m)   Wt 217 lb (98.4 kg) Comment:  from Sept weight  BMI 27.86 kg/m  Physical Exam Constitutional:      Appearance: Normal appearance.     Comments: Wheelchair bound  HENT:     Head: Normocephalic and atraumatic.  Eyes:     Extraocular Movements: Extraocular movements intact.     Conjunctiva/sclera: Conjunctivae normal.  Cardiovascular:     Rate and Rhythm: Normal rate and regular rhythm.  Pulmonary:     Effort: Pulmonary effort is normal.     Breath sounds: Normal breath sounds.  Abdominal:     General: Bowel sounds are normal.     Palpations: Abdomen is soft.  Musculoskeletal:        General: Normal range of motion.     Cervical back: Normal range of motion and neck supple.  Skin:    General: Skin is warm.  Neurological:     General: No focal deficit present.     Mental Status: He is alert and oriented to person, place, and time.  Psychiatric:        Mood and Affect: Mood normal.        Behavior: Behavior normal.     Comments: nonverbal      Assessment: *Nausea and vomiting-resolved *Abdominal pain-improved *Constipation-well-controlled *Colon cancer screening *LA grade C esophagitis-on PPI therapy *Diabetic gastroparesis  Plan: Patient much improved today in office.  Continue on Reglan for diabetic gastroparesis.  Counseled on risk of tardive dyskinesia and he understands. Recommend tight glycemic control.   Decrease PPI to once daily. If has resurgence of symptoms, okay to increase back up to twice daily.   We will contact patient's facility in regards to Cologuard testing as this is yet to be completed.  Continue current regimen for constipation.  Follow-up in 6 months.   01/17/2023 11:26 AM   Disclaimer: This note was dictated with voice recognition software. Similar sounding words can inadvertently be transcribed and may not be corrected upon review.

## 2023-01-17 NOTE — Telephone Encounter (Signed)
Noted Order in to Lab

## 2023-02-12 LAB — COLOGUARD: COLOGUARD: NEGATIVE

## 2023-07-10 ENCOUNTER — Encounter: Payer: Self-pay | Admitting: Internal Medicine

## 2023-08-07 ENCOUNTER — Ambulatory Visit (INDEPENDENT_AMBULATORY_CARE_PROVIDER_SITE_OTHER): Admitting: Internal Medicine

## 2023-08-07 ENCOUNTER — Encounter: Payer: Self-pay | Admitting: Internal Medicine

## 2023-08-07 VITALS — BP 123/88 | HR 118 | Temp 98.0°F | Ht 74.0 in | Wt 221.6 lb

## 2023-08-07 DIAGNOSIS — K5900 Constipation, unspecified: Secondary | ICD-10-CM | POA: Diagnosis not present

## 2023-08-07 DIAGNOSIS — K2100 Gastro-esophageal reflux disease with esophagitis, without bleeding: Secondary | ICD-10-CM

## 2023-08-07 DIAGNOSIS — K209 Esophagitis, unspecified without bleeding: Secondary | ICD-10-CM

## 2023-08-07 DIAGNOSIS — Z1211 Encounter for screening for malignant neoplasm of colon: Secondary | ICD-10-CM

## 2023-08-07 DIAGNOSIS — K3184 Gastroparesis: Secondary | ICD-10-CM | POA: Diagnosis not present

## 2023-08-07 DIAGNOSIS — R1032 Left lower quadrant pain: Secondary | ICD-10-CM

## 2023-08-07 DIAGNOSIS — R112 Nausea with vomiting, unspecified: Secondary | ICD-10-CM

## 2023-08-07 DIAGNOSIS — K5904 Chronic idiopathic constipation: Secondary | ICD-10-CM

## 2023-08-07 DIAGNOSIS — R109 Unspecified abdominal pain: Secondary | ICD-10-CM

## 2023-08-07 NOTE — Progress Notes (Signed)
 Primary Care Physician:  Delta Fila, MD Primary Gastroenterologist:  Dr. Mordechai April  Chief Complaint  Patient presents with   Follow-up    Patient here for a follow up on Colon cancer.Patient says he is doing well.     HPI:   Albert Jensen is a 53 y.o. male who presents to the clinic today for fu visit. Patient is nonverbal.  Answers yes and no questions with his hands.  53 year old male with history cardiac arrest in October 2016 with protracted downtime of about 15 minutes, subsequent drug-eluting stent to the RCA but apparently suffered a stroke or anoxic brain injury.  Also history of diabetes, hypertension, chronic CNS deficit, depression, chronic renal insufficiency.  Has a history of diverting colostomy due to sacral decubital ulcer and subsequent osteomyelitis.  He was in the hospital for nearly 150 days for treatment of this.  Initially seen September 2023 for nausea and vomiting daily.  Has a basin with him in case he becomes sick.  Happens on a daily basis.  Takes Zofran  without much improvement.  Not related to meals.  Actually his appetite is "very good."  Patient does tend to eat too fast and nurses try to get him to slow down.  Patient agrees that he eats too fast.  Does have diabetes though patient reportedly refuses medications for this.  EGD 01/2022: - LA Grade C reflux esophagitis with no bleeding. Biopsied. Acute erosive esophagitis. - Z-line irregular, 40 cm from the incisors. - Gastritis. Biopsied. Reactive gastropathy with foveolar hyperplasia and minimal chronic gastritis. - Normal duodenal bulb and second portion of the duodenum  Started on PPI twice daily as well as Reglan  before meals for suspected diabetic gastroparesis.  On follow-up 03/2022 reported his nausea vomiting had resolved.  Abdominal pain resolved.  Now down to pantoprazole  1 time daily.  Cologuard testing ordered on previous visit for colon cancer screening, which was negative.    Today, states he is doing great. No GI complaints for me today.   Past Medical History:  Diagnosis Date   Anemia    Anoxic encephalopathy (HCC)    Aphasia following cerebral infarction    BPH with obstruction/lower urinary tract symptoms    CAD (coronary artery disease)    DES proximal RCA and aspiration thrombectomy mid RCA and PLB 01/2015   Cardiac arrest [facility])    October 2016 with STEMI   Cataract, bilateral    Chronic kidney disease    Cognitive communication deficit    Colostomy in place (HCC)    Dementia (HCC)    Depression    Diabetic neuropathy (HCC)    Essential hypertension    GERD (gastroesophageal reflux disease)    Hemiplegia (HCC)    History of blood transfusion    History of infection due to ESBL Escherichia coli    History of tracheostomy    Neuromuscular dysfunction of bladder    Osteomyelitis (HCC)    Paroxysmal A-fib (HCC)    Peripheral vascular disease (HCC)    Respiratory failure (HCC)    Prolonged ventilatory course, complicated by mucus plugging and ultimately required tracheostomy 10-02/2015   Restless leg syndrome    RVOT-VT (right ventricular outflow tract ventricular tachycardia) (HCC)    Evaluated by Dr. Carolynne Citron in 2012   Seborrheic dermatitis    ST elevation myocardial infarction (STEMI) of inferolateral wall [facility])    October 2016    Stroke [facility])    Suprapubic catheter (HCC)    Supraventricular tachycardia (HCC)  Type 2 diabetes mellitus (HCC)    Ventilator associated pneumonia [facility])     Past Surgical History:  Procedure Laterality Date   BIOPSY  01/15/2022   Procedure: BIOPSY;  Surgeon: Vinetta Greening, DO;  [facility];  Service: Endoscopy;;   BOTOX  INJECTION N/A 10/30/2021   Procedure: CYSTOSCOPY BOTOX  INJECTION, SUPRAPUBIC TUBE EXCHANGE;  Surgeon: Samson Croak, MD;  Location[facility];  Service: Urology;  Laterality: N/A;  45 MINS   BOTOX  INJECTION N/A 04/30/2022   Procedure: CYSTOSCOPY BOTOX  INJECTION;  Surgeon:  Samson Croak, MD;  Location: [facility];  Service: Urology;  Laterality: N/A;  12 MINS FOR CASE   CARDIAC CATHETERIZATION N/A 01/22/2015   Procedure: Left Heart Cath and Coronary Angiography;  Surgeon: Lucendia Rusk, MD;  Location: [facility] INVASIVE CV LAB;  Service: Cardiovascular;  Laterality: N/A;   CARDIAC CATHETERIZATION N/A 01/22/2015   Procedure: Coronary Stent Intervention;  Surgeon: Lucendia Rusk, MD;  Location: [facility] INVASIVE CV LAB;  Service: Cardiovascular;  Laterality: N/A;  prox rca   ESOPHAGOGASTRODUODENOSCOPY (EGD) WITH PROPOFOL  N/A 01/15/2022   Procedure: ESOPHAGOGASTRODUODENOSCOPY (EGD) WITH PROPOFOL ;  Surgeon: Vinetta Greening, DO;  [facility];  Service: Endoscopy;  Laterality: N/A;  9:30 am    Current Outpatient Medications  Medication Sig Dispense Refill   acetaminophen  (TYLENOL ) 650 MG CR tablet Take 650 mg by mouth every 4 (four) hours as needed for pain.     amLODipine (NORVASC) 10 MG tablet Take 10 mg by mouth in the morning. (0800)     atorvastatin  (LIPITOR ) 40 MG tablet Take 40 mg by mouth daily.     benzocaine (ORAJEL) 10 % mucosal gel Use as directed 1 Application in the mouth or throat as needed for mouth pain.     Emollient (CETAPHIL) cream Apply 1 Application topically every 12 (twelve) hours as needed (hydration of skin (groin, buttocks & perineum)).     lactose free nutrition (BOOST) LIQD Take 237 mLs by mouth 2 (two) times daily between meals.     Menthol-Zinc Oxide (CALMOSEPTINE) 0.44-20.6 % OINT Apply 1 Application topically every 8 (eight) hours as needed (protection (groin/buttocks/perineum)).     metoCLOPramide  (REGLAN ) 5 MG tablet Take 1 tablet (5 mg total) by mouth 3 (three) times daily before meals. 90 tablet 5   mirabegron ER (MYRBETRIQ) 50 MG TB24 tablet Take 50 mg by mouth in the morning. (0800)     ondansetron  (ZOFRAN ) 4 MG tablet Take 4 mg by mouth every 4 (four) hours as needed for nausea/vomiting.     pantoprazole  (PROTONIX ) 40  MG tablet Take 1 tablet (40 mg total) by mouth 2 (two) times daily. 60 tablet 11   sennosides-docusate sodium  (SENOKOT-S) 8.6-50 MG tablet Take 2 tablets by mouth 2 (two) times daily. (0800)     solifenacin (VESICARE) 5 MG tablet Take 5 mg by mouth in the morning. (0800)     hydrocortisone  cream 1 % Apply to affected area 2 times daily 15 g 0   No current facility-administered medications for this visit.    Allergies as of 08/07/2023   (No Known Allergies)    Family History  Problem Relation Age of Onset   Heart disease Mother 57       MI   Diabetes Father     Social History   Socioeconomic History   Marital status: Single    Spouse name: Not on file   Number of children: Not on file  Years of education: Not on file   Highest education level: Not on file  Occupational History   Not on file  Tobacco Use   Smoking status: Former    Current packs/day: 0.00    Types: Cigarettes    Quit date: 05/01/2005    Years since quitting: 18.2   Smokeless tobacco: Never  Vaping Use   Vaping status: Never Used  Substance and Sexual Activity   Alcohol use: Not Currently    Alcohol/week: 1.0 standard drink of alcohol    Types: 1 Cans of beer per week   Drug use: Not Currently   Sexual activity: Not Currently  Other Topics Concern   Not on file  Social History Narrative   Not on file   Social Drivers of Health   Financial Resource Strain: Not on file  Food Insecurity: Not on file  Transportation Needs: Not on file  Physical Activity: Not on file  Stress: Not on file  Social Connections: Not on file  Intimate Partner Violence: Not on file    Subjective: Review of Systems  Constitutional:  Negative for chills and fever.  HENT:  Negative for congestion and hearing loss.   Eyes:  Negative for blurred vision and double vision.  Respiratory:  Negative for cough and shortness of breath.   Cardiovascular:  Negative for chest pain and palpitations.  Gastrointestinal:  Negative  for abdominal pain, blood in stool, constipation, diarrhea, heartburn, melena, nausea and vomiting.  Genitourinary:  Negative for dysuria and urgency.  Musculoskeletal:  Negative for joint pain and myalgias.  Skin:  Negative for itching and rash.  Neurological:  Negative for dizziness and headaches.  Psychiatric/Behavioral:  Negative for depression. The patient is not nervous/anxious.        Objective: BP 123/88 (BP Location: Left Arm, Patient Position: Sitting, Cuff Size: Normal)   Pulse (!) 118   Temp 98 F (36.7 C) (Temporal)   Ht 6\' 2"  (1.88 m)   Wt 221 lb 9.6 oz (100.5 kg)   BMI 28.45 kg/m  Physical Exam Constitutional:      Appearance: Normal appearance.     Comments: Wheelchair bound  HENT:     Head: Normocephalic and atraumatic.  Eyes:     Extraocular Movements: Extraocular movements intact.     Conjunctiva/sclera: Conjunctivae normal.  Cardiovascular:     Rate and Rhythm: Normal rate and regular rhythm.  Pulmonary:     Effort: Pulmonary effort is normal.     Breath sounds: Normal breath sounds.  Abdominal:     General: Bowel sounds are normal.     Palpations: Abdomen is soft.  Musculoskeletal:        General: Normal range of motion.     Cervical back: Normal range of motion and neck supple.  Skin:    General: Skin is warm.  Neurological:     General: No focal deficit present.     Mental Status: He is alert and oriented to person, place, and time.  Psychiatric:        Mood and Affect: Mood normal.        Behavior: Behavior normal.     Comments: nonverbal      Assessment: *Nausea and vomiting-resolved *Abdominal pain-improved *Constipation-well-controlled *Colon cancer screening *LA grade C esophagitis-on PPI therapy *Diabetic gastroparesis  Plan: Patient much improved today in office.  Continue on Reglan  for diabetic gastroparesis.  Counseled on risk of tardive dyskinesia and he understands. Recommend tight glycemic control.   Continue on  pantoprazole   daily. If has resurgence of symptoms, okay to increase back up to twice daily.   Repeat Cologuard testing 01/2026  Continue current regimen for constipation.  Follow-up in 6 months.   08/07/2023 9:42 AM   Disclaimer: This note was dictated with voice recognition software. Similar sounding words can inadvertently be transcribed and may not be corrected upon review.

## 2023-08-07 NOTE — Patient Instructions (Signed)
 I am happy to hear that you are doing so well.  Continue on pantoprazole  and metoclopramide .   Cologuard testing negative.  Recommend repeat testing October 2027.   Otherwise follow-up in 6 months.   It was very nice seeing you again today.  Dr. Mordechai April

## 2023-12-26 ENCOUNTER — Encounter: Payer: Self-pay | Admitting: Internal Medicine

## 2024-02-21 ENCOUNTER — Encounter: Payer: Self-pay | Admitting: Gastroenterology

## 2024-02-21 ENCOUNTER — Ambulatory Visit: Admitting: Gastroenterology

## 2024-02-21 VITALS — BP 135/79 | HR 70 | Temp 98.2°F | Ht 73.0 in | Wt 207.0 lb

## 2024-02-21 DIAGNOSIS — K3184 Gastroparesis: Secondary | ICD-10-CM

## 2024-02-21 DIAGNOSIS — K219 Gastro-esophageal reflux disease without esophagitis: Secondary | ICD-10-CM

## 2024-02-21 NOTE — Progress Notes (Signed)
 GI Office Note    Referring Provider: Celine Erla Bong* Primary Care Physician:  Celine Erla Bong, MD  Primary Gastroenterologist: Carlin POUR. Cindie, DO  Chief Complaint   Chief Complaint  Patient presents with   Follow-up    History of Present Illness     Discussed the use of AI scribe software for clinical note transcription with the patient, who gave verbal consent to proceed.  History of Present Illness   Albert Jensen is a 53 year old male with diabetic gastroparesis and GERD who presents for a follow-up visit.        In October 2016, he experienced a cardiac arrest with a prolonged downtime of about 15 minutes, necessitating the placement of a drug-eluting stent in the right coronary artery. Subsequently, he suffered a stroke or anoxic brain injury, resulting in chronic central nervous system deficits. He is nonverbal and communicates using hand signals for yes and no responses.  Has a history of diverting colostomy due to sacral decubital ulcer and subsequent osteomyelitis.  He was in the hospital for nearly 150 days for treatment of this.   He has a history of suspected diabetic gastroparesis, initially presenting with daily nausea and vomiting that was unresponsive to Zofran . Treatment with a proton pump inhibitor twice daily and metoclopramide  before meals resolved his symptoms, including nausea, vomiting, and abdominal pain.   Today he reports that he is doing well.  No further nausea and vomiting.  No side effects from the metoclopramide  noted.  No heartburn, dysphagia.  Appetite is good.  No weight loss.  No vomiting.  No abdominal pain.  His bowel movements are regular, with no melena or blood noted in the colostomy bag.  Colon cancer screening via Cologuard test.  He has remaining rectal stump which has not been screened.    Prior Data    Results   DIAGNOSTIC Cologuard test: Negative in October 2024, on recall to repeat in 3 years     EGD  01/2022: - LA Grade C reflux esophagitis with no bleeding. Biopsied. Acute erosive esophagitis. - Z-line irregular, 40 cm from the incisors. - Gastritis. Biopsied. Reactive gastropathy with foveolar hyperplasia and minimal chronic gastritis.  Negative for H. pylori - Normal duodenal bulb and second portion of the duodenum   Medications   Current Outpatient Medications  Medication Sig Dispense Refill   acetaminophen  (TYLENOL ) 650 MG CR tablet Take 650 mg by mouth 3 (three) times daily.     amLODipine (NORVASC) 10 MG tablet Take 10 mg by mouth in the morning. (0800)     atorvastatin  (LIPITOR ) 40 MG tablet Take 40 mg by mouth daily.     benzocaine (ORAJEL) 10 % mucosal gel Use as directed 1 Application in the mouth or throat as needed for mouth pain.     cetirizine (ZYRTEC) 10 MG tablet Take 10 mg by mouth daily.     Colloidal Oatmeal 1 % LOTN Apply 1 Application topically every 12 (twelve) hours as needed.     lactose free nutrition (BOOST) LIQD Take 237 mLs by mouth 2 (two) times daily between meals.     Menthol-Zinc Oxide (CALMOSEPTINE) 0.44-20.6 % OINT Apply 1 Application topically every 8 (eight) hours as needed (protection (groin/buttocks/perineum)).     METFORMIN  HCL PO Take 500 mg by mouth daily.     metoCLOPramide  (REGLAN ) 5 MG tablet Take 1 tablet (5 mg total) by mouth 3 (three) times daily before meals. 90 tablet 5   mirabegron ER (  MYRBETRIQ) 50 MG TB24 tablet Take 50 mg by mouth in the morning. (0800)     ondansetron  (ZOFRAN ) 4 MG tablet Take 4 mg by mouth every 4 (four) hours as needed for nausea/vomiting.     sennosides-docusate sodium  (SENOKOT-S) 8.6-50 MG tablet Take 2 tablets by mouth 2 (two) times daily. (0800)     solifenacin (VESICARE) 5 MG tablet Take 5 mg by mouth in the morning. (0800)     pantoprazole  (PROTONIX ) 40 MG tablet Take 1 tablet (40 mg total) by mouth daily before breakfast.     No current facility-administered medications for this visit.    Allergies    Allergies as of 02/21/2024   (No Known Allergies)      Review of Systems   General: Negative for anorexia, weight loss, fever, chills, fatigue, weakness. ENT: Negative for hoarseness, difficulty swallowing , nasal congestion. CV: Negative for chest pain, angina, palpitations, dyspnea on exertion, peripheral edema.  Respiratory: Negative for dyspnea at rest, dyspnea on exertion, cough, sputum, wheezing.  GI: See history of present illness. GU:  Negative for dysuria, hematuria, urinary incontinence, urinary frequency, nocturnal urination.  Endo: Negative for unusual weight change.     Physical Exam   BP 135/79   Pulse 70   Temp 98.2 F (36.8 C) (Temporal)   Ht 6' 1 (1.854 m)   Wt 207 lb (93.9 kg) Comment: Reported by facility on 01/28/2024  SpO2 95%   BMI 27.31 kg/m    General: Well-nourished, well-developed in no acute distress.  Eyes: No icterus. Mouth: Oropharyngeal mucosa moist and pink   Lungs: Clear to auscultation bilaterally.  Heart: Regular rate and rhythm, no murmurs rubs or gallops.  Abdomen: Bowel sounds are normal, nontender, nondistended, no hepatosplenomegaly or masses,  no abdominal bruits or hernia , no rebound or guarding.  Rectal: not performed Extremities: No lower extremity edema. No clubbing or deformities. Neuro: Alert and oriented x 4   Skin: Warm and dry, no jaundice.   Psych: Alert and cooperative, normal mood and affect.  Labs   None available Imaging Studies   No results found.  Assessment/Plan:      Suspected diabetic gastroparesis: Resolved symptoms on metoclopramide , no evidence of tardive dyskinesia, patient understands risk.   - Continue metoclopramide  5 mg before meals. - Continue pantoprazole  once daily. - Recommend tight glycemic control  LA grade C esophagitis: Doing well  -Continue pantoprazole  40 mg daily before breakfast   Colon cancer screening:  Colostomy status, with Hartman's pouch.  Patient has rectal stump.   Rectal stump has not been screened for colon cancer.  Cologuard negative last year with plans to repeat in 3 years.  To discuss potential screening of rectal stump with Dr. Cindie.  Patient agreeable to endoscopic evaluation if needed.    Return for office visit in 1 year for gastroparesis and reflux esophagitis.  Call sooner if any questions or concerns.  Sonny RAMAN. Ezzard, MHS, PA-C [facility]

## 2024-02-21 NOTE — Patient Instructions (Addendum)
 Continue metoclopramide  5 mg before meals. Continue pantoprazole  40 mg before breakfast. Call with any questions or concerns. Return to the office in 1 year or sooner if needed.

## 2024-02-21 NOTE — Progress Notes (Deleted)
 GI Office Note    Referring Provider: Celine Erla Bong* Primary Care Physician:  Celine Erla Bong, MD  Primary Gastroenterologist: Carlin POUR. Cindie, DO   Chief Complaint   Chief Complaint  Patient presents with   Follow-up    History of Present Illness   Discussed the use of AI scribe software for clinical note transcription with the patient, who gave verbal consent to proceed.  History of Present Illness      Prior Data     Results  CT A/P with contrast 12/2022 IMPRESSION: 1. Postsurgical changes of distal colon resection with left lower anterior colostomy. No bowel obstruction. Normal appendix. 2. Sigmoid diverticulosis. 3. Diarrheal state. Correlation with clinical exam and stool cultures recommended. 4. Nonobstructing bilateral renal calculi. No hydronephrosis. 5. Cholelithiasis. 6.  Aortic Atherosclerosis (ICD10-I70.0). **probable fatty liver  EGD 01/2022: - LA Grade C reflux esophagitis with no bleeding. Biopsied. Acute erosive esophagitis. - Z-line irregular, 40 cm from the incisors. - Gastritis. Biopsied. Reactive gastropathy with foveolar hyperplasia and minimal chronic gastritis. - Normal duodenal bulb and second portion of the duodenum   Medications   Current Outpatient Medications  Medication Sig Dispense Refill   acetaminophen  (TYLENOL ) 650 MG CR tablet Take 650 mg by mouth 3 (three) times daily.     amLODipine (NORVASC) 10 MG tablet Take 10 mg by mouth in the morning. (0800)     atorvastatin  (LIPITOR ) 40 MG tablet Take 40 mg by mouth daily.     benzocaine (ORAJEL) 10 % mucosal gel Use as directed 1 Application in the mouth or throat as needed for mouth pain.     cetirizine (ZYRTEC) 10 MG tablet Take 10 mg by mouth daily.     Colloidal Oatmeal 1 % LOTN Apply 1 Application topically every 12 (twelve) hours as needed.     lactose free nutrition (BOOST) LIQD Take 237 mLs by mouth 2 (two) times daily between meals.      Menthol-Zinc Oxide (CALMOSEPTINE) 0.44-20.6 % OINT Apply 1 Application topically every 8 (eight) hours as needed (protection (groin/buttocks/perineum)).     METFORMIN  HCL PO Take 500 mg by mouth daily.     metoCLOPramide  (REGLAN ) 5 MG tablet Take 1 tablet (5 mg total) by mouth 3 (three) times daily before meals. 90 tablet 5   mirabegron ER (MYRBETRIQ) 50 MG TB24 tablet Take 50 mg by mouth in the morning. (0800)     ondansetron  (ZOFRAN ) 4 MG tablet Take 4 mg by mouth every 4 (four) hours as needed for nausea/vomiting.     pantoprazole  (PROTONIX ) 40 MG tablet Take 1 tablet (40 mg total) by mouth 2 (two) times daily. (Patient taking differently: Take 40 mg by mouth daily.) 60 tablet 11   sennosides-docusate sodium  (SENOKOT-S) 8.6-50 MG tablet Take 2 tablets by mouth 2 (two) times daily. (0800)     solifenacin (VESICARE) 5 MG tablet Take 5 mg by mouth in the morning. (0800)     No current facility-administered medications for this visit.    Allergies   Allergies as of 02/21/2024   (No Known Allergies)        Review of Systems   General: Negative for anorexia, weight loss, fever, chills, fatigue, weakness. ENT: Negative for hoarseness, difficulty swallowing , nasal congestion. CV: Negative for chest pain, angina, palpitations, dyspnea on exertion, peripheral edema.  Respiratory: Negative for dyspnea at rest, dyspnea on exertion, cough, sputum, wheezing.  GI: See history of present illness. GU:  Negative for dysuria, hematuria, urinary  incontinence, urinary frequency, nocturnal urination.  Endo: Negative for unusual weight change.     Physical Exam   BP 135/79   Pulse 70   Temp 98.2 F (36.8 C) (Temporal)   Ht 6' 1 (1.854 m)   Wt 207 lb (93.9 kg) Comment: Reported by facility on 01/28/2024  SpO2 95%   BMI 27.31 kg/m    General: Well-nourished, well-developed in no acute distress.  Eyes: No icterus. Mouth: Oropharyngeal mucosa moist and pink   Lungs: Clear to auscultation  bilaterally.  Heart: Regular rate and rhythm, no murmurs rubs or gallops.  Abdomen: Bowel sounds are normal, nontender, nondistended, no hepatosplenomegaly or masses,  no abdominal bruits or hernia , no rebound or guarding.  Rectal: not performed Extremities: No lower extremity edema. No clubbing or deformities. Neuro: Alert and oriented x 4   Skin: Warm and dry, no jaundice.   Psych: Alert and cooperative, normal mood and affect.  Labs   *** Imaging Studies   No results found.  Assessment/Plan:   Assessment and Plan Assessment & Plan            Sonny RAMAN. Ezzard, MHS, PA-C [facility]

## 2024-04-13 ENCOUNTER — Telehealth: Payer: Self-pay | Admitting: Gastroenterology

## 2024-04-13 NOTE — Telephone Encounter (Signed)
 Please reach out to patient/legal guardian. Patient has aphasia but understands and communicates with yes/no responses. I discussed with Dr. Cindie, he recommends colonoscopy via ostomy and rectal stump for appropriate colon cancer screening. Cologuard done by PCP does not screen rectal stump for malignancy. If patient/legal guardian is agreeable, we can set up. If they are not agreeable or have questions, we can have him return for ov to address.

## 2024-04-14 NOTE — Telephone Encounter (Signed)
 Call facility and transferred to Belinda's VM, LMOVM to call back

## 2024-04-14 NOTE — Telephone Encounter (Signed)
 Spoke with Lucie at facility and they are ok with pt having recommended procedure. Scheduling will need to contact Belinda at the facility to arrange.

## 2024-04-14 NOTE — Telephone Encounter (Addendum)
 Colonoscopy via ostomy and rectal stump with carver ASA 3, room 3. Day of prep, can take metformin  in am only. Day of colonoscopy, hold am metformin . Gentle tap water  enema to rectal stump

## 2024-04-15 MED ORDER — PEG 3350-KCL-NA BICARB-NACL 420 G PO SOLR: 4000.0000 mL | Freq: Once | ORAL | 0 refills | Status: AC

## 2024-04-15 MED ORDER — BISACODYL EC 5 MG PO TBEC: 10.0000 mg | DELAYED_RELEASE_TABLET | Freq: Once | ORAL | 0 refills | Status: AC

## 2024-04-15 MED ORDER — SIMETHICONE 40 MG/0.6ML PO SUSP: ORAL | 0 refills | Status: AC

## 2024-04-15 NOTE — Addendum Note (Signed)
 Addended by: JEANELL GRAEME RAMAN on: 04/15/2024 08:29 AM   Modules accepted: Orders

## 2024-04-15 NOTE — Telephone Encounter (Signed)
 Belinda called back scheduled patient for 04/28/24. Advised will fax her the instructions to [phone].

## 2024-04-23 ENCOUNTER — Encounter (HOSPITAL_COMMUNITY): Payer: Self-pay

## 2024-04-23 ENCOUNTER — Encounter (HOSPITAL_COMMUNITY)
Admission: RE | Admit: 2024-04-23 | Discharge: 2024-04-23 | Disposition: A | Discharge status: Home / Self Care | Source: Ambulatory Visit | Attending: Internal Medicine | Admitting: Internal Medicine

## 2024-04-23 NOTE — Pre-Procedure Instructions (Signed)
 Spoke with Belinda @ [place]. Patient will need lift and they will leave his pad under him for us  to us . He also has aphasia, but understands. Jerelene states he can sign for himself but chart states he has a POA, which I could not find in chart.prep instructions were refaxed to [facility]. I also left message for pt's brother, Gerad Cornelio, to call us  back, to see if patient dose have a POA.

## 2024-04-28 ENCOUNTER — Encounter (HOSPITAL_COMMUNITY)
Admission: RE | Disposition: A | Discharge status: Skilled Nursing Facility | Payer: Self-pay | Source: Home / Self Care | Attending: Internal Medicine

## 2024-04-28 ENCOUNTER — Ambulatory Visit (HOSPITAL_COMMUNITY)

## 2024-04-28 ENCOUNTER — Ambulatory Visit (HOSPITAL_COMMUNITY)
Admission: RE | Admit: 2024-04-28 | Discharge: 2024-04-28 | Disposition: A | Discharge status: Skilled Nursing Facility | Attending: Internal Medicine | Admitting: Internal Medicine

## 2024-04-28 ENCOUNTER — Encounter (HOSPITAL_COMMUNITY): Payer: Self-pay | Admitting: Internal Medicine

## 2024-04-28 DIAGNOSIS — I2510 Atherosclerotic heart disease of native coronary artery without angina pectoris: Secondary | ICD-10-CM | POA: Diagnosis not present

## 2024-04-28 DIAGNOSIS — Z1211 Encounter for screening for malignant neoplasm of colon: Secondary | ICD-10-CM | POA: Diagnosis not present

## 2024-04-28 DIAGNOSIS — Z7984 Long term (current) use of oral hypoglycemic drugs: Secondary | ICD-10-CM | POA: Insufficient documentation

## 2024-04-28 DIAGNOSIS — E1122 Type 2 diabetes mellitus with diabetic chronic kidney disease: Secondary | ICD-10-CM | POA: Diagnosis not present

## 2024-04-28 DIAGNOSIS — Z87891 Personal history of nicotine dependence: Secondary | ICD-10-CM | POA: Diagnosis not present

## 2024-04-28 DIAGNOSIS — K6389 Other specified diseases of intestine: Secondary | ICD-10-CM | POA: Diagnosis not present

## 2024-04-28 DIAGNOSIS — I252 Old myocardial infarction: Secondary | ICD-10-CM | POA: Diagnosis not present

## 2024-04-28 DIAGNOSIS — F0390 Unspecified dementia without behavioral disturbance: Secondary | ICD-10-CM | POA: Diagnosis not present

## 2024-04-28 DIAGNOSIS — K219 Gastro-esophageal reflux disease without esophagitis: Secondary | ICD-10-CM | POA: Insufficient documentation

## 2024-04-28 DIAGNOSIS — Z8673 Personal history of transient ischemic attack (TIA), and cerebral infarction without residual deficits: Secondary | ICD-10-CM | POA: Insufficient documentation

## 2024-04-28 DIAGNOSIS — N189 Chronic kidney disease, unspecified: Secondary | ICD-10-CM | POA: Diagnosis not present

## 2024-04-28 DIAGNOSIS — I129 Hypertensive chronic kidney disease with stage 1 through stage 4 chronic kidney disease, or unspecified chronic kidney disease: Secondary | ICD-10-CM | POA: Insufficient documentation

## 2024-04-28 DIAGNOSIS — Z933 Colostomy status: Secondary | ICD-10-CM | POA: Diagnosis not present

## 2024-04-28 DIAGNOSIS — D123 Benign neoplasm of transverse colon: Secondary | ICD-10-CM | POA: Insufficient documentation

## 2024-04-28 HISTORY — PX: POLYPECTOMY: SHX149

## 2024-04-28 HISTORY — PX: COLONOSCOPY: SHX5424

## 2024-04-28 LAB — HM COLONOSCOPY

## 2024-04-28 LAB — GLUCOSE, CAPILLARY: Glucose-Capillary: 165 mg/dL — ABNORMAL HIGH (ref 70–99)

## 2024-04-28 MED ORDER — LACTATED RINGERS IV SOLN: INTRAVENOUS | Status: DC

## 2024-04-28 MED ORDER — LACTATED RINGERS IV BOLUS
500.0000 mL | Freq: Once | INTRAVENOUS | Status: AC
  Administered 2024-04-28: 500 mL via INTRAVENOUS

## 2024-04-28 MED ORDER — PROPOFOL 500 MG/50ML IV EMUL
INTRAVENOUS | Status: DC | PRN
  Administered 2024-04-28: 125 ug/kg/min via INTRAVENOUS
  Administered 2024-04-28: 50 mg via INTRAVENOUS

## 2024-04-28 NOTE — Discharge Instructions (Addendum)
" °  Colonoscopy Discharge Instructions  Read the instructions outlined below and refer to this sheet in the next few weeks. These discharge instructions provide you with general information on caring for yourself after you leave the hospital. Your doctor may also give you specific instructions. While your treatment has been planned according to the most current medical practices available, unavoidable complications occasionally occur.   ACTIVITY You may resume your regular activity, but move at a slower pace for the next 24 hours.  Take frequent rest periods for the next 24 hours.  Walking will help get rid of the air and reduce the bloated feeling in your belly (abdomen).  No driving for 24 hours (because of the medicine (anesthesia) used during the test).   Do not sign any important legal documents or operate any machinery for 24 hours (because of the anesthesia used during the test).  NUTRITION Drink plenty of fluids.  You may resume your normal diet as instructed by your doctor.  Begin with a light meal and progress to your normal diet. Heavy or fried foods are harder to digest and may make you feel sick to your stomach (nauseated).  Avoid alcoholic beverages for 24 hours or as instructed.  MEDICATIONS You may resume your normal medications unless your doctor tells you otherwise.  WHAT YOU CAN EXPECT TODAY Some feelings of bloating in the abdomen.  Passage of more gas than usual.  Spotting of blood in your stool or on the toilet paper.  IF YOU HAD POLYPS REMOVED DURING THE COLONOSCOPY: No aspirin  products for 7 days or as instructed.  No alcohol for 7 days or as instructed.  Eat a soft diet for the next 24 hours.  FINDING OUT THE RESULTS OF YOUR TEST Not all test results are available during your visit. If your test results are not back during the visit, make an appointment with your caregiver to find out the results. Do not assume everything is normal if you have not heard from your  caregiver or the medical facility. It is important for you to follow up on all of your test results.  SEEK IMMEDIATE MEDICAL ATTENTION IF: You have more than a spotting of blood in your stool.  Your belly is swollen (abdominal distention).  You are nauseated or vomiting.  You have a temperature over 101.  You have abdominal pain or discomfort that is severe or gets worse throughout the day.   Your colonoscopy revealed 1 polyp(s) which I removed successfully. Await pathology results, my office will contact you. I recommend repeating colonoscopy in 7 years for surveillance purposes, depending on pathology results.  Unfortunately your rectal stump was full of stool.  We may need to consider flexible sigmoidoscopy to evaluate this region.  Follow-up in GI office in 3 months.   I hope you have a great rest of your week!  Carlin POUR. Cindie, D.O. Gastroenterology and Hepatology [facility]  "

## 2024-04-28 NOTE — Op Note (Signed)
 [facility] Patient Name: Albert Jensen Procedure Date: 04/28/2024 11:09 AM MRN: [id number] Date of Birth: February 28, 1971 Attending MD: Carlin POUR. Cindie , [place], [id number] CSN: [id number] Age: 54 Admit Type: Outpatient Procedure:                Colonoscopy Indications:              Screening for colorectal malignant neoplasm Providers:                Carlin POUR. Cindie, DO, Ashley Goins, Daphne Mulch                            Technician, Technician Referring MD:              Medicines:                See the Anesthesia note for documentation of the                            administered medications Complications:            No immediate complications. Estimated Blood Loss:     Estimated blood loss was minimal. Procedure:                Pre-Anesthesia Assessment:                           - The anesthesia plan was to use monitored                            anesthesia care (MAC).                           After obtaining informed consent, the colonoscope                            was passed under direct vision. Throughout the                            procedure, the patient's blood pressure, pulse, and                            oxygen saturations were monitored continuously. The                            [id number]-HQ190L ([id number]) Peds Colon was introduced                            through the sigmoid colostomy and advanced to the                            the cecum, identified by appendiceal orifice and                            ileocecal valve. The colonoscopy was performed                            without difficulty. The patient tolerated the  procedure well. The quality of the bowel                            preparation was evaluated using the BBPS [facility]                            Bowel Preparation Scale) with scores of: Right                            Colon = 3, Transverse Colon = 3 and Left Colon = 3                            (entire mucosa seen  well with no residual staining,                            small fragments of stool or opaque liquid). The                            total BBPS score equals 9. Scope In: 11:20:21 AM Scope Out: 11:33:02 AM Scope Withdrawal Time: 0 hours 7 minutes 6 seconds  Total Procedure Duration: 0 hours 12 minutes 41 seconds  Findings:      Extensive amounts of stool was found in the rectal stump, precluding       visualization. Scope then introduced through ostomy.      A 6 mm polyp was found in the transverse colon. The polyp was sessile.       The polyp was removed with a cold snare. Resection and retrieval were       complete.      An area of mild melanosis was found in the transverse colon, in the       ascending colon and in the cecum.      The exam was otherwise without abnormality. Impression:               - Stool in the rectum.                           - One 6 mm polyp in the transverse colon, removed                            with a cold snare. Resected and retrieved.                           - Melanosis in the colon.                           - The examination was otherwise normal. Moderate Sedation:      Per Anesthesia Care Recommendation:           - Patient has a contact number available for                            emergencies. The signs and symptoms of potential  delayed complications were discussed with the                            patient. Return to normal activities tomorrow.                            Written discharge instructions were provided to the                            patient.                           - Resume previous diet.                           - Continue present medications.                           - Return to GI clinic in 3 months.                           - Rectal stump incompletely visualized today due to                            large amount of stool. Consider flex sig with                            multiple  enemas prior. We would be able to perform                            enema and preop if needed. Discuss further on                            follow-up visit. otherwise repeat full colonoscopy                            in 7 years. Procedure Code(s):        --- Professional ---                           [phone], Colonoscopy through stoma; with removal of                            tumor(s), polyp(s), or other lesion(s) by snare                            technique Diagnosis Code(s):        --- Professional ---                           Z12.11, Encounter for screening for malignant                            neoplasm of colon  D12.3, Benign neoplasm of transverse colon (hepatic                            flexure or splenic flexure)                           K63.89, Other specified diseases of intestine CPT copyright 2022 [employer] Association. All rights reserved. The codes documented in this report are preliminary and upon coder review may  be revised to meet current compliance requirements. Carlin POUR. Cindie, DO Carlin POUR. Cindie, DO 04/28/2024 11:44:19 AM This report has been signed electronically. Number of Addenda: 0

## 2024-04-28 NOTE — H&P (Signed)
 Primary Care Physician:  Celine Erla Bong, MD Primary Gastroenterologist:  Dr. Cindie  Pre-Procedure History & Physical: HPI:  Albert Jensen is a 54 y.o. male is here for a colonoscopy for colon cancer screening purposes.  Patient denies any family history of colorectal cancer.  No melena or hematochezia.    Past Medical History:  Diagnosis Date   Anemia    Anoxic encephalopathy (HCC)    Aphasia following cerebral infarction    BPH with obstruction/lower urinary tract symptoms    CAD (coronary artery disease)    DES proximal RCA and aspiration thrombectomy mid RCA and PLB 01/2015   Cardiac arrest [facility])    October 2016 with STEMI   Cataract, bilateral    Chronic kidney disease    Cognitive communication deficit    Colostomy in place (HCC)    Dementia (HCC)    Depression    Diabetic neuropathy (HCC)    Essential hypertension    GERD (gastroesophageal reflux disease)    Hemiplegia (HCC)    History of blood transfusion    History of infection due to ESBL Escherichia coli    History of tracheostomy    Neuromuscular dysfunction of bladder    Osteomyelitis (HCC)    Paroxysmal A-fib (HCC)    Peripheral vascular disease    Respiratory failure (HCC)    Prolonged ventilatory course, complicated by mucus plugging and ultimately required tracheostomy 10-02/2015   Restless leg syndrome    RVOT-VT (right ventricular outflow tract ventricular tachycardia) (HCC)    Evaluated by Dr. Waddell in 2012   Seborrheic dermatitis    ST elevation myocardial infarction (STEMI) of inferolateral wall [facility])    October 2016    Stroke [facility])    Suprapubic catheter [facility])    Supraventricular tachycardia    Type 2 diabetes mellitus (HCC)    Ventilator associated pneumonia (HCC)     Past Surgical History:  Procedure Laterality Date   BIOPSY  01/15/2022   Procedure: BIOPSY;  Surgeon: Cindie Carlin POUR, DO;  Location: [facility];  Service: Endoscopy;;   BOTOX  INJECTION N/A 10/30/2021   Procedure:  CYSTOSCOPY BOTOX  INJECTION, SUPRAPUBIC TUBE EXCHANGE;  Surgeon: Carolee Sherwood JONETTA DOUGLAS, MD;  Location: [facility] ORS;  Service: Urology;  Laterality: N/A;  45 MINS   BOTOX  INJECTION N/A 04/30/2022   Procedure: CYSTOSCOPY BOTOX  INJECTION;  Surgeon: Carolee Sherwood JONETTA DOUGLAS, MD;  Location: [facility] ORS;  Service: Urology;  Laterality: N/A;  29 MINS FOR CASE   CARDIAC CATHETERIZATION N/A 01/22/2015   Procedure: Left Heart Cath and Coronary Angiography;  Surgeon: Candyce GORMAN Reek, MD;  Location: [facility] INVASIVE CV LAB;  Service: Cardiovascular;  Laterality: N/A;   CARDIAC CATHETERIZATION N/A 01/22/2015   Procedure: Coronary Stent Intervention;  Surgeon: Candyce GORMAN Reek, MD;  Location: [facility] INVASIVE CV LAB;  Service: Cardiovascular;  Laterality: N/A;  prox rca   ESOPHAGOGASTRODUODENOSCOPY (EGD) WITH PROPOFOL  N/A 01/15/2022   Procedure: ESOPHAGOGASTRODUODENOSCOPY (EGD) WITH PROPOFOL ;  Surgeon: Cindie Carlin POUR, DO;  Location: [facility];  Service: Endoscopy;  Laterality: N/A;  9:30 am    Prior to Admission medications  Medication Sig Start Date End Date Taking? Authorizing Provider  acetaminophen  (TYLENOL ) 650 MG CR tablet Take 650 mg by mouth 3 (three) times daily.   Yes [provider]  amLODipine (NORVASC) 10 MG tablet Take 10 mg by mouth in the morning. (0800)   Yes [provider]  atorvastatin  (LIPITOR ) 40 MG tablet Take 40 mg by mouth daily.   Yes [provider]  benzocaine (ORAJEL) 10 % mucosal gel Use as directed 1 Application in the mouth or throat as needed for mouth pain.   Yes [provider]  cetirizine (ZYRTEC) 10 MG tablet Take 10 mg by mouth daily.   Yes [provider]  metoCLOPramide  (REGLAN ) 5 MG tablet Take 1 tablet (5 mg total) by mouth 3 (three) times daily before meals. 12/14/21 04/28/24 Yes Aaryn Sermon K, DO  mirabegron ER (MYRBETRIQ) 50 MG TB24 tablet Take 50 mg by mouth in the morning. (0800)   Yes [provider]  ondansetron  (ZOFRAN ) 4 MG  tablet Take 4 mg by mouth every 4 (four) hours as needed for nausea/vomiting. 09/14/21  Yes [provider]  pantoprazole  (PROTONIX ) 40 MG tablet Take 1 tablet (40 mg total) by mouth daily before breakfast. 02/21/24  Yes Ezzard Sonny RAMAN, PA-C  solifenacin (VESICARE) 5 MG tablet Take 5 mg by mouth in the morning. (0800)   Yes [provider]  Colloidal Oatmeal 1 % LOTN Apply 1 Application topically every 12 (twelve) hours as needed.    [provider]  lactose free nutrition (BOOST) LIQD Take 237 mLs by mouth 2 (two) times daily between meals.    [provider]  Menthol-Zinc Oxide (CALMOSEPTINE) 0.44-20.6 % OINT Apply 1 Application topically every 8 (eight) hours as needed (protection (groin/buttocks/perineum)).    [provider]  METFORMIN  HCL PO Take 500 mg by mouth daily.    [provider]  sennosides-docusate sodium  (SENOKOT-S) 8.6-50 MG tablet Take 2 tablets by mouth 2 (two) times daily. (0800)    [provider]  simethicone  (INFANTS SIMETHICONE ) 40 MG/0.6ML drops AS DIRECTED FOR COLONOSCOPY 04/15/24   Cindie Carlin POUR, DO    Allergies as of 04/15/2024   (No Known Allergies)    Family History  Problem Relation Age of Onset   Heart disease Mother 32       MI   Diabetes Father     Social History   Socioeconomic History   Marital status: Single    Spouse name: Not on file   Number of children: Not on file   Years of education: Not on file   Highest education level: Not on file  Occupational History   Not on file  Tobacco Use   Smoking status: Former    Current packs/day: 0.00    Types: Cigarettes    Quit date: 05/01/2005    Years since quitting: 19.0   Smokeless tobacco: Never  Vaping Use   Vaping status: Never Used  Substance and Sexual Activity   Alcohol use: Not Currently    Alcohol/week: 1.0 standard drink of alcohol    Types: 1 Cans of beer per week   Drug use: Not Currently   Sexual activity: Not  Currently  Other Topics Concern   Not on file  Social History Narrative   Not on file   Social Drivers of Health   Tobacco Use: Medium Risk (04/28/2024)   Patient History    Smoking Tobacco Use: Former    Smokeless Tobacco Use: Never    Passive Exposure: Not on Actuary Strain: Not on file  Food Insecurity: Not on file  Transportation Needs: Not on file  Physical Activity: Not on file  Stress: Not on file  Social Connections: Not on file  Intimate Partner Violence: Not on file  Depression (EYV7-0): Not on file  Alcohol Screen: Not on file  Housing: Not on file  Utilities: Not  on file  Health Literacy: Not on file    Review of Systems: See HPI, otherwise negative ROS  Physical Exam: Vital signs in last 24 hours: Temp:  [97.8 F (36.6 C)] 97.8 F (36.6 C) (01/20 1000) Pulse Rate:  [117] 117 (01/20 1000) Resp:  [16] 16 (01/20 1000) BP: (135)/(88) 135/88 (01/20 1000) SpO2:  [96 %] 96 % (01/20 1000)   General:   Alert,  Well-developed, well-nourished, pleasant and cooperative in NAD Head:  Normocephalic and atraumatic. Eyes:  Sclera clear, no icterus.   Conjunctiva pink. Ears:  Normal auditory acuity. Nose:  No deformity, discharge,  or lesions. Msk:  Symmetrical without gross deformities. Normal posture. Extremities:  Without clubbing or edema. Neurologic:  Alert and  oriented x4;  grossly normal neurologically. Skin:  Intact without significant lesions or rashes. Psych:  Alert and cooperative. Normal mood and affect.  Impression/Plan: Albert Jensen is here for a colonoscopy to be performed for colon cancer screening purposes.  The risks of the procedure including infection, bleed, or perforation as well as benefits, limitations, alternatives and imponderables have been reviewed with the patient. Questions have been answered. All parties agreeable.

## 2024-04-28 NOTE — Anesthesia Postprocedure Evaluation (Signed)
"   Anesthesia Post Note  Patient: Jery Hollern  Procedure(s) Performed: COLONOSCOPY POLYPECTOMY, INTESTINE  Patient location during evaluation: PACU Anesthesia Type: General Level of consciousness: awake and alert Pain management: pain level controlled Vital Signs Assessment: post-procedure vital signs reviewed and stable Respiratory status: spontaneous breathing, nonlabored ventilation, respiratory function stable and patient connected to nasal cannula oxygen Cardiovascular status: blood pressure returned to baseline and stable Postop Assessment: no apparent nausea or vomiting Anesthetic complications: no   No notable events documented.   Last Vitals:  Vitals:   04/28/24 1000 04/28/24 1139  BP: 135/88 105/68  Pulse: (!) 117 92  Resp: 16 16  Temp: 36.6 C (!) 36.2 C  SpO2: 96% 94%    Last Pain:  Vitals:   04/28/24 1139  TempSrc: Oral  PainSc: Asleep                 Andrea Limes      "

## 2024-04-28 NOTE — Transfer of Care (Signed)
 Immediate Anesthesia Transfer of Care Note  Patient: Albert Jensen  Procedure(s) Performed: COLONOSCOPY POLYPECTOMY, INTESTINE  Patient Location: Short Stay  Anesthesia Type:General  Level of Consciousness: awake  Airway & Oxygen Therapy: Patient Spontanous Breathing  Post-op Assessment: Report given to RN  Post vital signs: Reviewed and stable  Last Vitals:  Vitals Value Taken Time  BP    Temp    Pulse    Resp    SpO2      Last Pain:  Vitals:   04/28/24 1000  TempSrc: Oral      Patients Stated Pain Goal: 5 (04/28/24 1000)  Complications: No notable events documented.

## 2024-04-28 NOTE — Anesthesia Preprocedure Evaluation (Addendum)
"                                    Anesthesia Evaluation  Patient identified by MRN, date of birth, ID band Patient awake  General Assessment Comment:Pt nonverbal but able to answer questions with thumbs up or thumbs down  Reviewed: Allergy & Precautions, H&P , NPO status , Patient's Chart, lab work & pertinent test results  Airway Mallampati: II  TM Distance: >3 FB Neck ROM: Full    Dental no notable dental hx.    Pulmonary pneumonia, former smoker   Pulmonary exam normal breath sounds clear to auscultation       Cardiovascular hypertension, + CAD, + Past MI and + Peripheral Vascular Disease  Normal cardiovascular exam Rhythm:Regular Rate:Tachycardia  Past EKG's show hr between 100-115   Neuro/Psych  PSYCHIATRIC DISORDERS  Depression   Dementia CVA    GI/Hepatic Neg liver ROS,GERD  ,,  Endo/Other  diabetes    Renal/GU Renal disease  negative genitourinary   Musculoskeletal negative musculoskeletal ROS (+)    Abdominal   Peds negative pediatric ROS (+)  Hematology  (+) Blood dyscrasia, anemia   Anesthesia Other Findings   Reproductive/Obstetrics negative OB ROS                              Anesthesia Physical Anesthesia Plan  ASA: 4  Anesthesia Plan: General   Post-op Pain Management:    Induction: Intravenous  PONV Risk Score and Plan:   Airway Management Planned: Nasal Cannula and Natural Airway  Additional Equipment:   Intra-op Plan:   Post-operative Plan: Extubation in OR  Informed Consent: I have reviewed the patients History and Physical, chart, labs and discussed the procedure including the risks, benefits and alternatives for the proposed anesthesia with the patient or authorized representative who has indicated his/her understanding and acceptance.     Dental advisory given  Plan Discussed with: CRNA  Anesthesia Plan Comments:          Anesthesia Quick Evaluation  "

## 2024-04-29 ENCOUNTER — Encounter (INDEPENDENT_AMBULATORY_CARE_PROVIDER_SITE_OTHER): Payer: Self-pay | Admitting: *Deleted

## 2024-04-29 ENCOUNTER — Encounter (HOSPITAL_COMMUNITY): Payer: Self-pay | Admitting: Internal Medicine

## 2024-04-29 LAB — SURGICAL PATHOLOGY

## 2024-05-01 ENCOUNTER — Ambulatory Visit: Payer: Self-pay | Admitting: Internal Medicine

## 2024-07-21 ENCOUNTER — Ambulatory Visit: Admitting: Gastroenterology
